# Patient Record
Sex: Female | Born: 1980
Health system: Southern US, Community
[De-identification: ages and names within clinical notes are randomized; demographics above are authoritative.]

## PROBLEM LIST (undated history)

## (undated) DIAGNOSIS — R112 Nausea with vomiting, unspecified: Secondary | ICD-10-CM

## (undated) DIAGNOSIS — G43019 Migraine without aura, intractable, without status migrainosus: Principal | ICD-10-CM

## (undated) DIAGNOSIS — Z9889 Other specified postprocedural states: Secondary | ICD-10-CM

## (undated) DIAGNOSIS — F410 Panic disorder [episodic paroxysmal anxiety] without agoraphobia: Secondary | ICD-10-CM

## (undated) DIAGNOSIS — M1990 Unspecified osteoarthritis, unspecified site: Secondary | ICD-10-CM

## (undated) DIAGNOSIS — E282 Polycystic ovarian syndrome: Secondary | ICD-10-CM

## (undated) DIAGNOSIS — IMO0002 Reserved for concepts with insufficient information to code with codable children: Secondary | ICD-10-CM

## (undated) HISTORY — PX: KNEE SURGERY: SHX244

## (undated) HISTORY — DX: Reserved for concepts with insufficient information to code with codable children: IMO0002

## (undated) HISTORY — DX: Polycystic ovarian syndrome: E28.2

## (undated) HISTORY — PX: KNEE ARTHROSCOPY W/ MENISCECTOMY: SHX1879

## (undated) HISTORY — DX: Panic disorder (episodic paroxysmal anxiety): F41.0

## (undated) HISTORY — DX: Migraine without aura, intractable, without status migrainosus: G43.019

## (undated) HISTORY — PX: TUBAL LIGATION: SHX77

---
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
## 2000-03-31 ENCOUNTER — Other Ambulatory Visit: Admission: RE | Admit: 2000-03-31 | Discharge: 2000-03-31 | Payer: Self-pay | Admitting: *Deleted

## 2000-09-29 ENCOUNTER — Other Ambulatory Visit: Admission: RE | Admit: 2000-09-29 | Discharge: 2000-09-29 | Payer: Self-pay | Admitting: *Deleted

## 2000-11-17 ENCOUNTER — Other Ambulatory Visit: Admission: RE | Admit: 2000-11-17 | Discharge: 2000-11-17 | Payer: Self-pay | Admitting: *Deleted

## 2001-06-07 ENCOUNTER — Other Ambulatory Visit: Admission: RE | Admit: 2001-06-07 | Discharge: 2001-06-07 | Payer: Self-pay | Admitting: Gynecology

## 2001-11-17 ENCOUNTER — Inpatient Hospital Stay (HOSPITAL_COMMUNITY): Admission: AD | Admit: 2001-11-17 | Discharge: 2001-11-17 | Payer: Self-pay | Admitting: Gynecology

## 2001-12-03 ENCOUNTER — Inpatient Hospital Stay (HOSPITAL_COMMUNITY): Admission: AD | Admit: 2001-12-03 | Discharge: 2001-12-06 | Payer: Self-pay | Admitting: Gynecology

## 2002-01-04 ENCOUNTER — Other Ambulatory Visit: Admission: RE | Admit: 2002-01-04 | Discharge: 2002-01-04 | Payer: Self-pay | Admitting: *Deleted

## 2002-09-24 ENCOUNTER — Other Ambulatory Visit: Admission: RE | Admit: 2002-09-24 | Discharge: 2002-09-24 | Payer: Self-pay | Admitting: Gynecology

## 2003-03-24 ENCOUNTER — Encounter (INDEPENDENT_AMBULATORY_CARE_PROVIDER_SITE_OTHER): Payer: Self-pay | Admitting: Specialist

## 2003-03-24 ENCOUNTER — Inpatient Hospital Stay (HOSPITAL_COMMUNITY): Admission: RE | Admit: 2003-03-24 | Discharge: 2003-03-26 | Payer: Self-pay | Admitting: Gynecology

## 2003-04-30 ENCOUNTER — Other Ambulatory Visit: Admission: RE | Admit: 2003-04-30 | Discharge: 2003-04-30 | Payer: Self-pay | Admitting: Gynecology

## 2004-05-17 ENCOUNTER — Other Ambulatory Visit: Admission: RE | Admit: 2004-05-17 | Discharge: 2004-05-17 | Payer: Self-pay | Admitting: Gynecology

## 2004-08-09 ENCOUNTER — Encounter: Admission: RE | Admit: 2004-08-09 | Discharge: 2004-08-09 | Payer: Self-pay | Admitting: Gastroenterology

## 2004-10-01 ENCOUNTER — Ambulatory Visit (HOSPITAL_COMMUNITY): Admission: RE | Admit: 2004-10-01 | Discharge: 2004-10-01 | Payer: Self-pay | Admitting: Gastroenterology

## 2005-05-25 ENCOUNTER — Other Ambulatory Visit: Admission: RE | Admit: 2005-05-25 | Discharge: 2005-05-25 | Payer: Self-pay | Admitting: Gynecology

## 2006-05-29 ENCOUNTER — Other Ambulatory Visit: Admission: RE | Admit: 2006-05-29 | Discharge: 2006-05-29 | Payer: Self-pay | Admitting: Gynecology

## 2007-06-08 ENCOUNTER — Other Ambulatory Visit: Admission: RE | Admit: 2007-06-08 | Discharge: 2007-06-08 | Payer: Self-pay | Admitting: Gynecology

## 2007-09-26 ENCOUNTER — Ambulatory Visit (HOSPITAL_COMMUNITY): Admission: RE | Admit: 2007-09-26 | Discharge: 2007-09-26 | Payer: Self-pay | Admitting: Surgery

## 2007-10-03 ENCOUNTER — Encounter: Admission: RE | Admit: 2007-10-03 | Discharge: 2007-10-03 | Payer: Self-pay | Admitting: Surgery

## 2007-10-12 ENCOUNTER — Ambulatory Visit (HOSPITAL_COMMUNITY): Admission: RE | Admit: 2007-10-12 | Discharge: 2007-10-12 | Payer: Self-pay | Admitting: Surgery

## 2007-10-17 ENCOUNTER — Ambulatory Visit (HOSPITAL_BASED_OUTPATIENT_CLINIC_OR_DEPARTMENT_OTHER): Admission: RE | Admit: 2007-10-17 | Discharge: 2007-10-17 | Payer: Self-pay | Admitting: Surgery

## 2007-10-20 ENCOUNTER — Ambulatory Visit: Payer: Self-pay | Admitting: Internal Medicine

## 2008-01-08 ENCOUNTER — Encounter: Admission: RE | Admit: 2008-01-08 | Discharge: 2008-02-04 | Payer: Self-pay | Admitting: Surgery

## 2008-01-22 ENCOUNTER — Inpatient Hospital Stay (HOSPITAL_COMMUNITY): Admission: RE | Admit: 2008-01-22 | Discharge: 2008-01-24 | Payer: Self-pay | Admitting: Surgery

## 2008-01-22 HISTORY — PX: ROUX-EN-Y GASTRIC BYPASS: SHX1104

## 2008-01-23 ENCOUNTER — Encounter (INDEPENDENT_AMBULATORY_CARE_PROVIDER_SITE_OTHER): Payer: Self-pay | Admitting: Surgery

## 2008-01-23 ENCOUNTER — Ambulatory Visit: Payer: Self-pay | Admitting: Vascular Surgery

## 2008-02-06 ENCOUNTER — Encounter: Admission: RE | Admit: 2008-02-06 | Discharge: 2008-05-06 | Payer: Self-pay | Admitting: Surgery

## 2008-07-30 ENCOUNTER — Encounter: Admission: RE | Admit: 2008-07-30 | Discharge: 2008-07-30 | Payer: Self-pay | Admitting: Surgery

## 2008-08-26 ENCOUNTER — Other Ambulatory Visit: Admission: RE | Admit: 2008-08-26 | Discharge: 2008-08-26 | Payer: Self-pay | Admitting: Gynecology

## 2008-08-26 ENCOUNTER — Encounter: Payer: Self-pay | Admitting: Gynecology

## 2008-08-26 ENCOUNTER — Ambulatory Visit: Payer: Self-pay | Admitting: Gynecology

## 2008-11-26 ENCOUNTER — Observation Stay (HOSPITAL_COMMUNITY): Admission: EM | Admit: 2008-11-26 | Discharge: 2008-11-26 | Payer: Self-pay | Admitting: Emergency Medicine

## 2008-12-18 ENCOUNTER — Inpatient Hospital Stay (HOSPITAL_COMMUNITY): Admission: EM | Admit: 2008-12-18 | Discharge: 2008-12-20 | Payer: Self-pay | Admitting: Emergency Medicine

## 2008-12-22 ENCOUNTER — Ambulatory Visit (HOSPITAL_COMMUNITY): Admission: RE | Admit: 2008-12-22 | Discharge: 2008-12-23 | Payer: Self-pay | Admitting: Surgery

## 2008-12-22 ENCOUNTER — Encounter (INDEPENDENT_AMBULATORY_CARE_PROVIDER_SITE_OTHER): Payer: Self-pay | Admitting: Surgery

## 2008-12-22 HISTORY — PX: LAPAROSCOPIC CHOLECYSTECTOMY: SUR755

## 2009-08-27 ENCOUNTER — Ambulatory Visit: Payer: Self-pay | Admitting: Gynecology

## 2009-08-27 ENCOUNTER — Other Ambulatory Visit: Admission: RE | Admit: 2009-08-27 | Discharge: 2009-08-27 | Payer: Self-pay | Admitting: Gynecology

## 2009-09-03 ENCOUNTER — Encounter: Admission: RE | Admit: 2009-09-03 | Discharge: 2009-09-03 | Payer: Self-pay | Admitting: Gynecology

## 2009-10-02 ENCOUNTER — Encounter: Admission: RE | Admit: 2009-10-02 | Discharge: 2009-10-02 | Payer: Self-pay | Admitting: Surgery

## 2010-07-09 LAB — DIFFERENTIAL
Basophils Absolute: 0 10*3/uL (ref 0.0–0.1)
Eosinophils Absolute: 0 10*3/uL (ref 0.0–0.7)
Eosinophils Relative: 0 % (ref 0–5)
Lymphocytes Relative: 25 % (ref 12–46)
Monocytes Absolute: 0.5 10*3/uL (ref 0.1–1.0)
Monocytes Relative: 8 % (ref 3–12)
Neutro Abs: 4.4 10*3/uL (ref 1.7–7.7)

## 2010-07-09 LAB — FERRITIN: Ferritin: 16 ng/mL (ref 10–291)

## 2010-07-09 LAB — CBC
HCT: 34 % — ABNORMAL LOW (ref 36.0–46.0)
Hemoglobin: 12 g/dL (ref 12.0–15.0)
MCHC: 34.2 g/dL (ref 30.0–36.0)
MCV: 93.7 fL (ref 78.0–100.0)
MCV: 95.5 fL (ref 78.0–100.0)
Platelets: 160 10*3/uL (ref 150–400)
RBC: 3.75 MIL/uL — ABNORMAL LOW (ref 3.87–5.11)
RDW: 12.5 % (ref 11.5–15.5)
RDW: 12.6 % (ref 11.5–15.5)

## 2010-07-09 LAB — BASIC METABOLIC PANEL
BUN: 7 mg/dL (ref 6–23)
Calcium: 8.3 mg/dL — ABNORMAL LOW (ref 8.4–10.5)
Chloride: 109 mEq/L (ref 96–112)
Creatinine, Ser: 0.7 mg/dL (ref 0.4–1.2)
GFR calc non Af Amer: >60 mL/min (ref >60)
Glucose, Bld: 77 mg/dL (ref 70–99)
Glucose, Bld: 88 mg/dL (ref 70–99)
Potassium: 3.8 mEq/L (ref 3.5–5.1)
Potassium: 3.9 mEq/L (ref 3.5–5.1)

## 2010-07-09 LAB — COMPREHENSIVE METABOLIC PANEL
Alkaline Phosphatase: 83 U/L (ref 39–117)
CO2: 23 mEq/L (ref 19–32)
Chloride: 111 mEq/L (ref 96–112)
GFR calc Af Amer: >60 mL/min (ref >60)
GFR calc non Af Amer: >60 mL/min (ref >60)
Glucose, Bld: 167 mg/dL — ABNORMAL HIGH (ref 70–99)
Potassium: 3.4 mEq/L — ABNORMAL LOW (ref 3.5–5.1)
Sodium: 140 mEq/L (ref 135–145)
Total Bilirubin: 0.8 mg/dL (ref 0.3–1.2)

## 2010-07-09 LAB — FOLATE: Folate: 15.9 ng/mL

## 2010-07-09 LAB — LIPID PANEL
Cholesterol: 95 mg/dL (ref 0–200)
LDL Cholesterol: 48 mg/dL (ref 0–99)
Total CHOL/HDL Ratio: 2.5 RATIO
Triglycerides: 46 mg/dL (ref <150)

## 2010-07-09 LAB — HEPATIC FUNCTION PANEL
Bilirubin, Direct: 0.1 mg/dL (ref 0.0–0.3)
Indirect Bilirubin: 0.5 mg/dL (ref 0.3–0.9)

## 2010-07-09 LAB — PROTIME-INR: INR: 1.2 (ref 0.00–1.49)

## 2010-07-09 LAB — RAPID URINE DRUG SCREEN, HOSP PERFORMED
Barbiturates: NOT DETECTED
Benzodiazepines: NOT DETECTED
Opiates: POSITIVE — AB
Tetrahydrocannabinol: NOT DETECTED

## 2010-07-09 LAB — URINALYSIS, ROUTINE W REFLEX MICROSCOPIC
Glucose, UA: NEGATIVE mg/dL
Protein, ur: NEGATIVE mg/dL
Urobilinogen, UA: 1 mg/dL (ref 0.0–1.0)
pH: 6.5 (ref 5.0–8.0)

## 2010-07-09 LAB — CARDIAC PANEL(CRET KIN+CKTOT+MB+TROPI)
CK, MB: 0.8 ng/mL (ref 0.3–4.0)
Relative Index: INVALID (ref 0.0–2.5)

## 2010-07-09 LAB — APTT: aPTT: 26 seconds (ref 24–37)

## 2010-07-09 LAB — IRON: Iron: 129 ug/dL (ref 42–135)

## 2010-07-10 LAB — DIFFERENTIAL
Eosinophils Absolute: 0 10*3/uL (ref 0.0–0.7)
Eosinophils Relative: 0 % (ref 0–5)
Lymphs Abs: 2 10*3/uL (ref 0.7–4.0)

## 2010-07-10 LAB — COMPREHENSIVE METABOLIC PANEL
ALT: 12 U/L (ref 0–35)
Albumin: 3.9 g/dL (ref 3.5–5.2)
Alkaline Phosphatase: 75 U/L (ref 39–117)
GFR calc Af Amer: >60 mL/min (ref >60)
Potassium: 4.2 mEq/L (ref 3.5–5.1)
Sodium: 138 mEq/L (ref 135–145)
Total Protein: 6.7 g/dL (ref 6.0–8.3)

## 2010-07-10 LAB — POCT PREGNANCY, URINE: Preg Test, Ur: NEGATIVE

## 2010-07-10 LAB — URINALYSIS, ROUTINE W REFLEX MICROSCOPIC
Glucose, UA: NEGATIVE mg/dL
Ketones, ur: 15 mg/dL — AB
Leukocytes, UA: NEGATIVE
Protein, ur: NEGATIVE mg/dL

## 2010-07-10 LAB — CBC
HCT: 36.8 % (ref 36.0–46.0)
MCV: 96.4 fL (ref 78.0–100.0)
Platelets: 219 10*3/uL (ref 150–400)
WBC: 11.6 10*3/uL — ABNORMAL HIGH (ref 4.0–10.5)

## 2010-07-10 LAB — URINE MICROSCOPIC-ADD ON

## 2010-08-17 NOTE — Discharge Summary (Signed)
Gabriella Sawyer, Gabriella Sawyer                 ACCOUNT NO.:  [id number]   MEDICAL RECORD NO.:  [id number]          PATIENT TYPE:  INP   LOCATION:  1523                         FACILITY:  [facility]   PHYSICIAN:  Thornton Park. [name], MD  DATE OF BIRTH:  1980/04/30   DATE OF ADMISSION:  01/22/2008  DATE OF DISCHARGE:  01/24/2008                               DISCHARGE SUMMARY   ADMITTING DIAGNOSIS:  Morbid obesity, body mass index of 50.   PROCEDURE:  Laparoscopic Roux-en-Y gastric bypass with a 1-M long Roux  limb, 40-cm BP limb, endoscopy per Dr. Johna Sheriff.   HOSPITAL COURSE:  Gabriella Sawyer is a 30 year old female who was taken  to the OR on October 20 and underwent the above-mentioned operation.  Postoperatively she did well and had a good Gastrografin swallow on  postoperative day 1.  Her diet was advanced.  Her Doppler study was  negative.  She proceeded to begin and was advanced on diet until postop  day 2 she was taking p.o.'s well.  She was given Roxicet for pain and  was discharged to follow-up in the office in the next week or so.  Condition good.      Thornton Park [name], MD  Electronically Signed     MBM/MEDQ  D:  02/07/2008  T:  02/07/2008  Job:  [id number]

## 2010-08-17 NOTE — Op Note (Signed)
Gabriella Sawyer, Gabriella Sawyer                 ACCOUNT NO.:  [id number]   MEDICAL RECORD NO.:  [id number]          PATIENT TYPE:  INP   LOCATION:  1523                         FACILITY:  [facility]   PHYSICIAN:  Sharlet Salina T. Hoxworth, M.D.DATE OF BIRTH:  1981/02/18   DATE OF PROCEDURE:  01/22/2008  DATE OF DISCHARGE:                               OPERATIVE REPORT   PROCEDURE:  Upper gastrointestinal endoscopy.   DESCRIPTION OF PROCEDURE:  Upper GI endoscopy was performed at the  completion of laparoscopic Roux-en-Y gastric bypass by Dr. [name].  The  Olympus video endoscope was inserted into the upper esophagus and then  passed under direct vision to the EG junction at approximately 40 cm.  The esophagus appeared normal.  The small gastric pouch was entered.  Then while Dr. [name] clamped the outlet of the pouch and under saline  irrigation, the pouch was tensely distended with air and there was no  evidence of leak.  The anastomosis was visualized and was patent.  Suture and staple lines were intact without bleeding.  The gastric  mucosa appeared normal.  The pouch measured about 5 cm in length and was  tubular in shape.  Following this, the pouch was desufflated and the  scope withdrawn.      Lorne Skeens. Hoxworth, M.D.  Electronically Signed     BTH/MEDQ  D:  01/22/2008  T:  01/23/2008  Job:  [id number]

## 2010-08-17 NOTE — Procedures (Signed)
NAME:  Gabriella Sawyer, Gabriella Sawyer                 ACCOUNT NO.:  [id number]   MEDICAL RECORD NO.:  [id number]          PATIENT TYPE:  OUT   LOCATION:  SLEEP CENTER                 FACILITY:  [facility]   PHYSICIAN:  Clinton D. Maple Hudson, MD, FCCP, FACPDATE OF BIRTH:  Jul 15[birth year] 1980   DATE OF STUDY:  10/17/2007                            NOCTURNAL POLYSOMNOGRAM   REFERRING PHYSICIAN:  Thornton Park. [name], MD   INDICATION FOR STUDY:  Hypersomnia with sleep apnea.   EPWORTH SLEEPINESS SCORE:  3/24, BMI 49.4, weight 270 pounds, height 62  inches, neck 16 inches.   MEDICATIONS:  Home medication:  None charted.   SLEEP ARCHITECTURE:  Total sleep time 389 minutes with sleep efficiency  92.7%.  Stage I was 4.5%, stage II 80.5%, stage III 0.1%, REM 14.9% of  total sleep time.  Sleep latency 24 minutes, REM latency 111 minutes,  awake after sleep onset 6 minutes, arousal index 7.4.  No bedtime  medication was taken.   RESPIRATORY DATA:  Apnea-hypopnea index (AHI) 0.2 per hour which is  normal.  A single hypopnea was recorded.   OXYGEN DATA:  Mild snoring with oxygen desaturation to a nadir of 91%.  Mean oxygen saturation through the study was 94.4% on room air.   CARDIAC DATA:  Normal sinus rhythm.   MOVEMENT-PARASOMNIA:  No significant movement disturbance.  No bathroom  trips.   IMPRESSIONS-RECOMMENDATIONS:  1. Normal study.  Apnea-hypopnea index 0.2 per hour (normal range 0 to      5 per hour).  Indicating no significant respiratory-related sleep      disturbance.  Mild snoring with normal oxygenation, desaturating to      a nadir of 91%.  2. No further measures are suggested by these study results.      Clinton D. Maple Hudson, MD, [facility], FACP  Diplomate, Biomedical engineer of Sleep Medicine  Electronically Signed     CDY/MEDQ  D:  10/20/2007 13:30:47  T:  10/20/2007 13:56:19  Job:  [id number]

## 2010-08-17 NOTE — Op Note (Signed)
Gabriella Sawyer, Gabriella Sawyer                 ACCOUNT NO.:  [id number]   MEDICAL RECORD NO.:  [id number]          PATIENT TYPE:  INP   LOCATION:  0003                         FACILITY:  [facility]   PHYSICIAN:  Thornton Park. [name], MD  DATE OF BIRTH:  1980-08-27   DATE OF PROCEDURE:  01/22/2008  DATE OF DISCHARGE:                               OPERATIVE REPORT   PREOPERATIVE DIAGNOSIS:  Morbid obesity, BMI 50.   PROCEDURE:  Laparoscopic Roux-en-Y gastric bypass with 1 meter Roux  limb, 40 cm bypass limb, ante colic, antegastric candy cane to the left  with closure of Peterson's defect, with upper endoscopy by Dr. Johna Sheriff.   SURGEON:  Luretha Murphy, M.D.   ASSISTANT:  Glenna Fellows, M.D.   ANESTHESIA:  General endotracheal.   OPERATIVE TIME:  3 hours.   DESCRIPTION OF PROCEDURE:  This a 30 year old white female taken to room  1 at [place] on the January 22, 2008.  After general anesthesia was  administered, the abdomen was prepped with Techni-Care and draped  sterilely.  The abdomen was entered using a 0 degrees OptiVu in the left  upper quadrant without difficulty.  The abdomen was insufflated.  Standard trocar placements were used including two 11s on the right, 11  to the left of the umbilicus for the camera and ultimately a 5 in the  upper abdomen for liver retractor.   The operation began by identification of ligament of Treitz after I took  down a plug of omentum that was seen to be incarcerated in her bilateral  tubal ligation port site.  There was no really hernia and I left this  plug in there.  The omentum was then taken up and put on the liver,  identified ligament of Treitz and then measured 40 cm distal to that  where I divided it with the Covidien 60 stapler white cartridge.  I then  put a Penrose drain, suturing it to the Roux limb end.  I then measured  100 cm and then laid the BP limb, adjacent that and created a side-to-  side stapled anastomosis again using a 60 white  cartridge.  I did this  by first putting down antimesenteric suture to hold the limbs parallel  and then opened along the antimesenteric borders.  The stapler was  inserted and fired.  The common defect was then closed from either end  with 2-0 Vicryl.  We tested that.  I did reinforce a portion of it and  then I put Tisseel on it ultimately.  Common defect between the  mesentery was closed with running 2-0 silk.  We then went up and divided  the omentum and then went up and put in [place] retractor.  I put in a  ruler and measured 5 cm down along the lesser curvature where we then  opened and then I inserted firing across and then up using the new Duet  (Covidien) staple line reinforcement on the 16 and then completely  divided the pouch.   The Roux limb was then brought up in an anticolic, antegastric fashion  and sewn  along the staple line with running 2-0 Vicryl.  Common openings  were made in either end.  This was done with the help of the Ewald tube  which also been used to size the pouch.  The 60 stapler blue was then  inserted and fired and the common defect creating the gastrojejunostomy  was then was performed.  The common opening was then closed from either  end with 2-0 Vicryl.  The second layer with the Ewald tube across the  anastomosis was then done using Laparoties and a 2-0 Vicryl on a free  needle.   We then clamped the efferent limb of the Roux and Dr. Johna Sheriff passed  the endoscope and we insufflated and tested our anastomosis under  pressure.  No bubbles were seen.  The pouch showed no evidence of  bleeding and looked good.  It measured about 5 cm in length.   Tisseel was applied to the gastrojejunostomy.  Everything looked to be  in order.  No bleeding was noted and I then removed the [facility],  deflated the abdomen, removed the trocars, injected them with half  percent Marcaine, closed the skin with 4-0 Vicryl subcutaneously with  staples and then tape.  The  patient was taken to recovery room in  satisfactory condition.      Thornton Park [name], MD  Electronically Signed     MBM/MEDQ  D:  01/22/2008  T:  01/22/2008  Job:  [id number]   cc:   Marcial Pacas P. Fontaine, M.D.  Fax: [phone]   Jene Every, M.D.  Fax: [phone]   Darin Engels, PA [facility]  [facility]

## 2010-08-20 NOTE — H&P (Signed)
NAME:  MINETTA, KRISHER                           ACCOUNT NO.:  [id number]   MEDICAL RECORD NO.:  [id number]                   PATIENT TYPE:  INP   LOCATION:  NA                                   FACILITY[facility]  [facility]   PHYSICIAN:  Timothy P. Fontaine, M.D.           DATE OF BIRTH:  1980-07-08   DATE OF ADMISSION:  03/24/2003  DATE OF DISCHARGE:                                HISTORY & PHYSICAL   She is being admitted, March 24, 2003 at 9 a.m., for a repeat cesarean  section and BTL.   CHIEF COMPLAINT:  Pregnancy at term, history of prior cesarean section,  desires repeat cesarean section, desires permanent sterilization.   HISTORY OF PRESENT ILLNESS:  A 30 year old G56, P51 female at term gestation  with a history of prior cesarean section for failed labor who desires repeat  cesarean section.  The patient was counseled for trial of labor, and she has  elected for a repeat C-section.  She has also been counseled as far as tubal  sterilization for which she wants to proceed.  She was counseled as to the  permanency of the procedure as well as the risk of failure and the issues as  far as her young age, and she understands and accepts all of these issues  and wants to proceed with sterilization.  For the remainder of her history,  see her [facility].   PHYSICAL EXAMINATION:  HEENT:  Normal.  LUNGS:  Clear.  CARDIAC:  Regular rate without rubs, murmurs or gallops.  ABDOMEN:  Benign with a gravid, vertex fetus.  Positive fetal heart tones.  PELVIC:  Deferred.   ASSESSMENT:  A 30 year old gravida 2 para 1 G2, P35 female, term gestation,  prior cesarean section for repeat cesarean section and bilateral tubal  ligation.   The patient was counseled as to the risks, benefits, indications and  alternatives to include trial of labor as well as other forms of birth  control.  The risks of bleeding, transfusion, infection, prolonged  antibiotics, abscess formation requiring re-operation and drainage,  wound  complications requiring opening and draining of incisions, closure by  secondary intention were all discussed, understood and accepted.  The risk  of an impertinent injury to internal organs including bowel, bladder,  ureters, vessels and nerves necessitating major reparative surgeries and  future reparative surgeries including ostomy formation were discussed,  understood and accepted.  The risk of fetal injury during the birthing  process was also reviewed.  The patient's questions were answered to her  satisfaction.  She is ready to proceed with surgery.                                               Timothy P. Fontaine, M.D.    TPF/MEDQ  D:  03/21/2003  T:  03/21/2003  Job:  [id number]

## 2010-08-20 NOTE — Op Note (Signed)
NAME:  Gabriella Sawyer, Gabriella Sawyer                           ACCOUNT NO.:  [id number]   MEDICAL RECORD NO.:  [id number]                   PATIENT TYPE:  INP   LOCATION:  9101                                 FACILITY:  [facility]   PHYSICIAN:  Timothy P. Fontaine, M.D.           DATE OF BIRTH:  1980-05-05   DATE OF PROCEDURE:  03/24/2003  DATE OF DISCHARGE:                                 OPERATIVE REPORT   PREOPERATIVE DIAGNOSES:  1. Pregnancy at term.  2. Prior cesarean section, desires repeat cesarean section.  3. Desires permanent sterilization.   POSTOPERATIVE DIAGNOSES:  1. Pregnancy at term.  2. Prior cesarean section, desires repeat cesarean section.  3. Desires permanent sterilization.   PROCEDURE:  Repeat low transverse cervical cesarean section.  Bilateral  tubal sterilization.  Modified Pomeroy technique.   SURGEON:  Timothy P. Fontaine, M.D.   ASSISTANTGaetano Hawthorne. Lily Peer, M.D.   ANESTHESIA:  Spinal.   ESTIMATED BLOOD LOSS:  Less than 500 mL.   COMPLICATIONS:  None.   SPECIMENS:  Portions of right and portions of left fallopian tubes.  Cord  blood.   FINDINGS:  At 9:08, normal female, Apgars 9 and 9.  Pelvic anatomy noted to be  normal.   DESCRIPTION OF PROCEDURE:  The patient was taken to the operating room and  underwent spinal anesthesia, placed in the left tilt supine position.  Received an abdominal preparation with Betadine solution.  Bladder emptied  with indwelling Foley catheterization.  The patient was draped in usual  fashion.  After assuring adequate anesthesia, the abdomen was sharply  entered through a repeat Pfannenstiel incision achieving adequate hemostasis  at all levels.  The bladder flap was sharply and bluntly developed without  difficulty.  Uterus was sharply entered in the lower uterine segment and  bluntly extended laterally.  The bulging membranes were ruptured.  The fluid  noted to be clear.  The infant's head was delivered through the incision.  Nares and mouth suctioned.  A nuchal cord was reduced. The rest of the  infant was delivered. The cord doubly clamped and cut.  The infant was  handed to pediatrics in attendance.  Samples of cord blood were obtained.  The placenta was spontaneously extruded and noted to be intact.  Uterus was  exteriorized.  Endometrial cavity explored with a sponge to remove all  placental membrane fragments.  The patient received 1 g Ancef antibiotic  prophylaxis at this time.  The uterine incision was closed in one layer  using 0 Vicryl suture in a running interlocking stitch.  Several figure-of-  eight interrupted sutures were placed to reinforce the incision line for  ultimate hemostasis.  Attention was then turned to the tubal sterilization.  The right fallopian tube was then identified, traced from its insertion to  its fimbriated end and a mid tubal segment was then grasped, elevated,  doubly ligated using 0 plain suture and a  representative intervening segment  excised.  Tubal lumen as well as adequate hemostasis was grossly visualized.  A similar procedure was carried out on the other side.  The uterus was then  returned to the abdomen which was copiously irrigated.  Adequate hemostasis  visualized and the anterior fascia was reapproximated using 0 Vicryl suture  in a running stitch.  Subcutaneous tissues irrigated.  Adequate hemostasis  achieved with electrocautery.  Skin reapproximated with 4-0 Vicryl in a  running subcuticular stitch.  Steri-Strips and Benzoin applied.  Sterile  dressing applied.  The patient was taken to the recovery room in good  condition having tolerated the procedure well.                                               Timothy P. Audie Box, M.D.    TPF/MEDQ  D:  03/24/2003  T:  03/24/2003  Job:  [id number]

## 2010-08-20 NOTE — Op Note (Signed)
NAME:  Gabriella Sawyer, Gabriella Sawyer                           ACCOUNT NO.:  [id number]   MEDICAL RECORD NO.:  [id number]                   PATIENT TYPE:  INP   LOCATION:  9119                                 FACILITY:  [facility]   PHYSICIAN:  Devin M. Ciliberti, M.D.            DATE OF BIRTH:  1980/10/11   DATE OF PROCEDURE:  12/03/2001  DATE OF DISCHARGE:                                 OPERATIVE REPORT   PREOPERATIVE DIAGNOSIS:  Arrest of dilatation.   POSTOPERATIVE DIAGNOSIS:  Arrest of dilatation.   PROCEDURE:  Primary low transverse cesarean section.   SURGEON:  Devin M. Ciliberti, M.D.   ANESTHESIA:  Epidural with combined spinal.   ESTIMATED BLOOD LOSS:  600 cc.   URINE OUTPUT:  200 cc of clear fluid.   FLUIDS OUTPUT:  2 L Crystalloid.   COMPLICATIONS:  None.   SPECIMENS:  Cord blood.   FINDINGS:  Living 9 pound 5 ounce female, Apgars 8 and 9 with three-vessel  cord and placenta intact.  Normal-appearing uterus, tubes and ovaries.   DESCRIPTION OF PROCEDURE:  The patient was taken to the operating room where  epidural anesthesia was bolused; however, at this point the patient is still  feeling some tenderness on the right.  At that point, anesthesia placed a  spinal block above the epidural, which the patient was then adequately  anesthetized.  The patient was then prepped and draped in normal sterile  fashion.  A Pfannenstiel incision was made with a scalpel and carried down  to the underlying fascia.  The fascia was then extended transversely using  curved Mayo scissors.  The underlying rectus muscles were dissected off both  bluntly and sharply with curved Mayo scissors.  The rectus muscles were  separated in the midline manually and extended both superiorly and then  inferiorly to the level of the pubic bone.  The peritoneum was then entered  sharply using the hemostat.  The peritoneal incision was then extended  superiorly and then inferiorly to the level of the bladder.  The  vesicouterine peritoneum was identified and incised, extended transversely.  The bladder flap was then created digitally.  The bladder blade was then  placed in the newly created bladder flap.  A low transverse uterine incision  was made with the scalpel.  The incision was extended manually.  The  infant's head was then delivered atraumatically.  The mouth and nose  suctioned; the rest of the body was delivered without any complications.  The cord was clamped and cut, and the infant handed off to the awaiting NIC  attendance.  Cord blood was obtained.  The placenta was massaged from the  uterus.  The uterus was then exteriorized and cleared of all clots and  debris.  A running 1-0 chromic interlocking stitch was then used to close  the lower uterine segment.  A pack was then placed in the incision line.  The posterior cul-de-sac was then irrigated with warm normal saline.  The  pack was then removed and several areas on the incision were noted to have  some bleeding; these were Bovie cauterized.  The uterus was then returned to  the pelvis.  One liter of saline was then used to irrigate the pelvis, and  the incision noted to be hemostatic at this point.  The peritoneum was then  closed using a 0 chromic in a running continuous stitch.  The subfascial  area was inspected for bleeding and noted to be hemostatic.  The fascia was  then closed using 0 Vicryl in a running continuous stitch.  The subcutaneous  tissue was then irrigated with warm normal saline, and Bovie cautery of the  areas that were bleeding.  A 3-0 plain was then used to close the  subcutaneous tissue and staples were used to close the skin.  The patient  was taken to the recovery room in stable condition.                                               Devin M. Ciliberti, M.D.    DMC/MEDQ  D:  12/03/2001  T:  12/03/2001  Job:  [id number]

## 2010-08-20 NOTE — Discharge Summary (Signed)
NAME:  Gabriella Sawyer, Gabriella Sawyer                           ACCOUNT NO.:  [id number]   MEDICAL RECORD NO.:  [id number]                   PATIENT TYPE:  INP   LOCATION:  9101                                 FACILITY:  [facility]   PHYSICIAN:  Timothy P. Fontaine, M.D.           DATE OF BIRTH:  [birth year]   DATE OF ADMISSION:  03/24/2003  DATE OF DISCHARGE:  03/26/2003                                 DISCHARGE SUMMARY   DISCHARGE DIAGNOSES:  1. Intrauterine pregnancy at 39 weeks delivered.  2. History of prior cesarean section.  3. Desired repeat cesarean section.  4. Desired attempt at permanent sterilization.  5. Status post repeat low transverse cervical cesarean section and bilateral     tubal sterilization modified Pomeroy technique by Dr. Colin Broach on     March 24, 2003.   HISTORY:  This 22-years-of-age female gravida 2 para 1 with a prenatal  course that was complicated by history of prior cesarean section, desired  repeat cesarean section; also desired attempt at permanent sterilization.   HOSPITAL COURSE:  On March 24, 2003 the patient was admitted, underwent a  repeat low transverse cervical cesarean section and bilateral tubal  sterilization modified Pomeroy technique by Dr. Colin Broach and  underwent delivery of a female, Apgars of 9 and 9, weight of 7 pounds 5  ounces.  There were no complications.  Postoperatively, the patient remained  afebrile, voiding, in stable condition, and she was discharged to home on  March 26, 2003 in satisfactory condition and given [facility] Gy[facility]  postpartum instructions and postpartum booklet.   ACCESSORY CLINICAL FINDINGS/LABORATORY DATA:  The patient is AB positive,  rubella immune.  On March 25, 2003 hemoglobin was 11.3.   DISPOSITION:  1. The patient was is discharged to home.  2. Follow-up in 6 weeks; if any problem prior to that time to be seen in the     office.  3. Was given prescription for Tylox p.r.n. pain.     Susa Loffler, P.A.                    Timothy P. Audie Box, M.D.    Ardath Sax  D:  04/28/2003  T:  04/28/2003  Job:  [id number]

## 2010-08-20 NOTE — Discharge Summary (Signed)
   NAME:  Gabriella Sawyer, Gabriella Sawyer                           ACCOUNT NO.:  [id number]   MEDICAL RECORD NO.:  [id number]                   PATIENT TYPE:  INP   LOCATION:  9119                                 FACILITY:  [facility]   PHYSICIAN:  Timothy P. Fontaine, M.D.           DATE OF BIRTH:  November 10, 1980   DATE OF ADMISSION:  12/03/2001  DATE OF DISCHARGE:  12/06/2001                                 DISCHARGE SUMMARY   DISCHARGE DIAGNOSES:  1. Intrauterine pregnancy at 40 weeks, delivered.  2. Arrest of dilatation, status post primary low transverse Cesarean section     on December 03, 2001.   HISTORY OF PRESENT ILLNESS:  This is a 30 year old female, G1, P0 with an  EDC of November 28, 2001, whose prenatal course was uncomplicated.   HOSPITAL COURSE:  On December 03, 2001, the patient presented at 40 weeks in  labor.  Artificial rupture of membranes were performed.  Labor was augmented  with Pitocin.  The patient developed a diagnosis of arrest of dilatation.  Therefore, the patient underwent a primary low transverse cesarean section  with Apgar's of 8 and 9 weighing 9 pounds 5 ounces with no complications.   On postop day #1, the patient did complain that one pupil size was greater  than the left which resolved spontaneously thought to be secondary to  scopolamine patch that had behind that ear.  She was afebrile, voiding and  in stable condition.  She was discharged to home on December 06, 2001, and  given [facility] follow-up appointment.   The patient is A positive and rubella immune.  On December 04, 2001,  hemoglobin was 11.6.   DISPOSITION:  The patient is discharged to home to return in six weeks.   SPECIAL INSTRUCTIONS:  If she has any problems before follow-up appointment,  she can be seen in the office.   DISCHARGE MEDICATIONS:  Tylox p.r.n. pain.     Susa Loffler, P.A.                    Timothy P. Audie Box, M.D.    Ardath Sax  D:  01/04/2002  T:  01/05/2002  Job:   [id number]

## 2010-08-20 NOTE — Op Note (Signed)
NAMEHALLEL, Gabriella Sawyer                 ACCOUNT NO.:  [id number]   MEDICAL RECORD NO.:  [id number]          PATIENT TYPE:  AMB   LOCATION:  ENDO                         FACILITY:  [facility]   PHYSICIAN:  Anselmo Rod, M.D.  DATE OF BIRTH:  1980/06/22   DATE OF PROCEDURE:  10/01/2004  DATE OF DISCHARGE:                                 OPERATIVE REPORT   PROCEDURE PERFORMED:  Screening colonoscopy.   ENDOSCOPIST:  Anselmo Rod, M.D.   INSTRUMENT USED:  Olympus video colonoscope.   INDICATIONS FOR PROCEDURE:  A 30 year old white female with a family history  of colon cancer in a maternal grandmother, undergoing colonoscopy for severe  abdominal pain to rule out colonic polyps, masses, etc., ? IBD.   PREPROCEDURE PREPARATION:  Informed consent was procured from the patient.  The patient was fasted for 8 hours prior to the procedure and prepped with a  bottle of magnesium citrate and a gallon of GoLYTELY the night prior to the  procedure. Risks and benefits of the procedure including a 10% miss rate of  cancer and polyps was discussed with the patient as well.   PREPROCEDURE PHYSICAL:  VITAL SIGNS:  The patient has stable vital signs.  NECK:  Supple.  CHEST:  Clear to auscultation. S1 and S2 regular.  ABDOMEN:  Soft with normal bowel sounds.   DESCRIPTION OF PROCEDURE:  The patient was placed in a left lateral  decubitus position and sedated with 75 mg of Demerol and 7.5 mg of Versed in  slow incremental doses. Once the patient was adequately sedated and  maintained on low flow oxygen and continuous cardiac monitoring, the Olympus  video colonoscope was advanced from the rectum to the cecum. The appendiceal  orifice and ileocecal valve were clearly visualized and photographed. No  masses, polyps, erosions, ulcerations, or diverticula was seen. Small  internal hemorrhoids were appreciated on retroflexion in the rectum. The  patient had abdominal discomfort with insufflation of air into  the colon  indicating a component of visceral hypersensitivity. The patient tolerated  the procedure well without complications.   IMPRESSION:  1.  Normal colonoscopy of the terminal ileum. No masses or polyps seen.  2.  Small internal hemorrhoids.  3.  Visceral hypersensitivity indicated by abdominal discomfort with      insufflation of air into the colon and rectum.   RECOMMENDATIONS:  1.  Continue a high-fiber diet with liberal fluid intake.  2.  A trial of Zelnorm versus Amitiza will be considered.  3.  Repeat colonoscopy at the age of 18.  4.  Outpatient followup in the next four weeks for further recommendations.       JNM/MEDQ  D:  10/01/2004  T:  10/01/2004  Job:  [id number]   cc:   Nadyne Coombes. Fontaine, M.D.  Fax: [phone]

## 2010-08-31 ENCOUNTER — Other Ambulatory Visit: Payer: Self-pay | Admitting: Gynecology

## 2010-08-31 ENCOUNTER — Other Ambulatory Visit (HOSPITAL_COMMUNITY)
Admission: RE | Admit: 2010-08-31 | Discharge: 2010-08-31 | Disposition: A | Discharge status: Home / Self Care | Payer: 59 | Source: Ambulatory Visit | Attending: Gynecology | Admitting: Gynecology

## 2010-08-31 ENCOUNTER — Encounter (INDEPENDENT_AMBULATORY_CARE_PROVIDER_SITE_OTHER): Payer: 59 | Admitting: Gynecology

## 2010-08-31 DIAGNOSIS — Z833 Family history of diabetes mellitus: Secondary | ICD-10-CM

## 2010-08-31 DIAGNOSIS — Z13220 Encounter for screening for lipoid disorders: Secondary | ICD-10-CM

## 2010-08-31 DIAGNOSIS — Z01419 Encounter for gynecological examination (general) (routine) without abnormal findings: Secondary | ICD-10-CM

## 2010-08-31 DIAGNOSIS — Z124 Encounter for screening for malignant neoplasm of cervix: Secondary | ICD-10-CM | POA: Insufficient documentation

## 2010-10-07 ENCOUNTER — Encounter (INDEPENDENT_AMBULATORY_CARE_PROVIDER_SITE_OTHER): Payer: Self-pay | Admitting: Surgery

## 2010-10-07 ENCOUNTER — Encounter (INDEPENDENT_AMBULATORY_CARE_PROVIDER_SITE_OTHER): Payer: Self-pay | Admitting: General Surgery

## 2010-10-08 ENCOUNTER — Encounter (INDEPENDENT_AMBULATORY_CARE_PROVIDER_SITE_OTHER): Payer: Self-pay | Admitting: Surgery

## 2010-10-08 ENCOUNTER — Ambulatory Visit (INDEPENDENT_AMBULATORY_CARE_PROVIDER_SITE_OTHER): Payer: 59 | Admitting: Surgery

## 2010-10-08 VITALS — BP 130/70 | HR 72 | Ht 62.0 in | Wt 183.0 lb

## 2010-10-08 DIAGNOSIS — M793 Panniculitis, unspecified: Secondary | ICD-10-CM

## 2010-10-08 NOTE — Progress Notes (Signed)
Allyse Fregeau returns today. She is 2.75 years out from her laparoscopic gastric bypass. Percent excess weight loss is 56% and today his BMI is down to 33.  Her main problem now is panniculitis irritation beneath her redundant pannus. She is seen for left him regarding this irritation and he has documented this and treated her with topical agents. She's had associated skin erythema and a painful rash.  She like to proceed with panniculectomy. I described this procedure to her in some detail and she is aware that we may have to sacrifice her umbilicus and resecting this and generous pannus.  Impression panniculitis related to bariatric surgery weight loss. Recommendation panniculectomy

## 2011-01-04 LAB — DIFFERENTIAL
Basophils Absolute: 0
Basophils Absolute: 0.1
Basophils Relative: 0
Eosinophils Absolute: 0
Eosinophils Absolute: 0
Eosinophils Absolute: 0.3
Eosinophils Relative: 0
Eosinophils Relative: 0
Eosinophils Relative: 5
Lymphs Abs: 1.5
Lymphs Abs: 4.4 — ABNORMAL HIGH
Monocytes Absolute: 0.4
Monocytes Absolute: 0.6
Neutrophils Relative %: 86 — ABNORMAL HIGH

## 2011-01-04 LAB — CBC
HCT: 37.5
HCT: 37.8
MCHC: 33.3
MCHC: 34.6
MCV: 91.9
MCV: 92.5
Platelets: 223
Platelets: 291
RBC: 4.52
RDW: 13.2
WBC: 11.8 — ABNORMAL HIGH
WBC: 7.1

## 2011-01-04 LAB — COMPREHENSIVE METABOLIC PANEL
ALT: 18
AST: 18
CO2: 26
Chloride: 106
GFR calc Af Amer: >60
GFR calc non Af Amer: >60
Potassium: 3.7
Sodium: 139
Total Bilirubin: 0.7

## 2011-01-04 LAB — PREGNANCY, URINE: Preg Test, Ur: NEGATIVE

## 2011-01-04 LAB — PROTIME-INR: Prothrombin Time: 13.5

## 2011-01-24 ENCOUNTER — Ambulatory Visit (HOSPITAL_BASED_OUTPATIENT_CLINIC_OR_DEPARTMENT_OTHER): Admission: RE | Admit: 2011-01-24 | Payer: 59 | Source: Ambulatory Visit | Admitting: Specialist

## 2011-01-31 ENCOUNTER — Encounter: Payer: Self-pay | Admitting: *Deleted

## 2011-01-31 ENCOUNTER — Telehealth: Payer: Self-pay | Admitting: *Deleted

## 2011-01-31 DIAGNOSIS — E282 Polycystic ovarian syndrome: Secondary | ICD-10-CM | POA: Insufficient documentation

## 2011-01-31 NOTE — Telephone Encounter (Signed)
Patient's Mirena IUD is past time to change.  Now c/o pain with passing clots.  Wants to know if we can go ahead and change out IUD now? Or should she be concerned something else going on? Please advise

## 2011-01-31 NOTE — Telephone Encounter (Signed)
Patient informed.  Appointments to set up.

## 2011-01-31 NOTE — Telephone Encounter (Signed)
Recommend office visit to address the pain and clots first period then can arrange IUD replacement.

## 2011-02-01 ENCOUNTER — Encounter: Payer: Self-pay | Admitting: Gynecology

## 2011-02-01 ENCOUNTER — Ambulatory Visit (INDEPENDENT_AMBULATORY_CARE_PROVIDER_SITE_OTHER): Payer: 59 | Admitting: Gynecology

## 2011-02-01 DIAGNOSIS — N949 Unspecified condition associated with female genital organs and menstrual cycle: Secondary | ICD-10-CM

## 2011-02-01 DIAGNOSIS — N926 Irregular menstruation, unspecified: Secondary | ICD-10-CM

## 2011-02-01 DIAGNOSIS — R102 Pelvic and perineal pain unspecified side: Secondary | ICD-10-CM

## 2011-02-01 DIAGNOSIS — B3731 Acute candidiasis of vulva and vagina: Secondary | ICD-10-CM

## 2011-02-01 DIAGNOSIS — N898 Other specified noninflammatory disorders of vagina: Secondary | ICD-10-CM

## 2011-02-01 DIAGNOSIS — B373 Candidiasis of vulva and vagina: Secondary | ICD-10-CM

## 2011-02-01 DIAGNOSIS — Z30431 Encounter for routine checking of intrauterine contraceptive device: Secondary | ICD-10-CM

## 2011-02-01 LAB — POCT URINE PREGNANCY: Preg Test, Ur: NEGATIVE

## 2011-02-01 MED ORDER — FLUCONAZOLE 150 MG PO TABS: 150.0000 mg | ORAL_TABLET | Freq: Once | ORAL | Status: AC

## 2011-02-01 NOTE — Progress Notes (Signed)
Patient presents complaining of vaginal bleeding with clots last several days with some vaginal pain some irritation and some end stream dysuria. She has a Mirena IUD due to be replaced July 2012. She does have a history of tubal sterilization and was using the Mirena for menstrual suppression and we just let her go past her due date is noted with her last office note. No fevers chills nausea vomiting diarrhea constipation.  Exam Abdomen soft nontender without masses guarding rebound organomegaly Pelvic external BUS vagina with no bleeding white discharge noted KOH wet prep done cervix normal bimanual uterus normal size anteverted midline mobile nontender adnexa without masses or tenderness Subsequently her Mirena IUD was removed the string was grasped with opposing forceps and easily removed. It was shown to the patient and discarded  Assessment and plan: 1. Vaginal discharge and stream dysuria. Wet prep is positive for yeast UA is negative we'll treat with Diflucan 150x1 dose follow up if symptoms persist or recur 2. IUD management. I think this bleeding is her first menses which is now resolved. Her IUD was removed I did a UPT today for completeness and a was negative. Patient can follow up and schedule a placement appointment for her.   If her irregular bleeding persists her pain persists then we'll reevaluate.

## 2011-02-02 ENCOUNTER — Telehealth: Payer: Self-pay

## 2011-02-02 NOTE — Telephone Encounter (Signed)
Patient called Gabriella Sawyer this morning. Amy had checked ins benefits with [facility] for IUD/insertion and called her yesterday and told her $150 copymt and 60/40 coinsurance applied. Patient said she herself called and talked with [facility] and was told $15 copymt.  Gabriella Sawyer asked me to call and check.  I spoke with F[place]on Mole who could not quote me benefit for [id number] however, she thought that the 58300 insertion would be a $15 copymt. She said the J code was not specifically addressed and she recommended doing a predetermination of benefits to be sure [employer] would pay for it.  I called the patient and relayed this to her.  Patient said she would have remembered it costing her a lot of money if it had before and she is confident with the info she received from [facility].  I told her I had printed out benefits and I tended to agree with her after reading through it but I could not guarantee it.  I told her I would be happy to do the predetermination form for her and it would tak 21 calendar days.  She declined and said she would keep appt for tomorrow. I told her we would accept $15 copymt but she would need to sign waiver saying she understood IUD might not be covered and she would be responsible for that payment if not. She agreed.

## 2011-02-03 ENCOUNTER — Telehealth: Payer: Self-pay | Admitting: *Deleted

## 2011-02-03 MED ORDER — MEGESTROL ACETATE 20 MG PO TABS: 20.0000 mg | ORAL_TABLET | Freq: Every day | ORAL | Status: AC

## 2011-02-03 NOTE — Telephone Encounter (Signed)
Pt had Mirena taken out. Needs a new one put in. Pt cancelled apt for apt for tomorrow due conflicting benefits. Three different benefits given so therefore a predetermination of benefits is going to be sent to [employer]. Pt wants to know if there is anything she can do this month until we get this information back. Since you took out the IUD she has been bleeding, more than spotting. She does have her tubes tied so its not for [facility] reasons. She wants to know if theres something you can do in the meantime.

## 2011-02-03 NOTE — Telephone Encounter (Signed)
Trial of Megace 20 mg daily for 2 weeks

## 2011-02-04 ENCOUNTER — Ambulatory Visit: Payer: 59 | Admitting: Gynecology

## 2011-02-07 NOTE — Telephone Encounter (Signed)
No answer

## 2011-02-08 ENCOUNTER — Ambulatory Visit: Payer: 59 | Admitting: Gynecology

## 2011-02-10 NOTE — Telephone Encounter (Signed)
Left message for patient to call back  

## 2011-02-10 NOTE — Telephone Encounter (Signed)
Informed, pt states quick bleedng so therefore wont get RX. Will let us know if starts bleeding again.

## 2011-02-18 NOTE — Progress Notes (Signed)
On November 8th,2012 I mailed patient a letter. The insurance company called in my voice mail in response to my request for Predetermination of Benefits to say that the two IUD codes "are covered benefits". They did not offer any information regarding patient's responsibility whether copaymt or deductible. The lady that called did not leave a phone number.  I included this info in my letter to the patient. ka

## 2011-03-02 ENCOUNTER — Telehealth: Payer: Self-pay | Admitting: *Deleted

## 2011-03-02 ENCOUNTER — Other Ambulatory Visit: Payer: Self-pay | Admitting: *Deleted

## 2011-03-02 ENCOUNTER — Ambulatory Visit (INDEPENDENT_AMBULATORY_CARE_PROVIDER_SITE_OTHER): Payer: 59 | Admitting: Gynecology

## 2011-03-02 ENCOUNTER — Encounter: Payer: Self-pay | Admitting: Gynecology

## 2011-03-02 DIAGNOSIS — Z30431 Encounter for routine checking of intrauterine contraceptive device: Secondary | ICD-10-CM

## 2011-03-02 DIAGNOSIS — Z30430 Encounter for insertion of intrauterine contraceptive device: Secondary | ICD-10-CM

## 2011-03-02 DIAGNOSIS — Z3049 Encounter for surveillance of other contraceptives: Secondary | ICD-10-CM

## 2011-03-02 MED ORDER — LEVONORGESTREL 20 MCG/24HR IU IUD: INTRAUTERINE_SYSTEM | Freq: Once | INTRAUTERINE | Status: DC

## 2011-03-02 MED ORDER — LEVONORGESTREL 20 MCG/24HR IU IUD: 1.0000 | INTRAUTERINE_SYSTEM | Freq: Once | INTRAUTERINE | Status: DC

## 2011-03-02 NOTE — Progress Notes (Signed)
Patient presents for Mirena IUD placement. History of menorrhagia controlled with the Mirena IUD. She just had her IUD removed last month and now is on a normal heavy period. She is status post BTL in the past. I again reviewed with her the issues of Mirena IUD and was involved with the placement. I discussed the risks to include uterine perforation requiring surgery to remove infection as well as long-term issues of infection and failure. She was reviewed the booklet signed the consent form and has no questions.  Exam External BUS vagina with menstrual type flow. Cervix normal. Uterus anteverted normal size midline mobile nontender. Adnexa without masses or tenderness.  IUD placement: Cervix was cleansed with Hibiclens grasped with a single-tooth tenaculum and the uterus was sounded. A Mirena IUD was placed according to manufacturer's recommendation. The strings were trimmed. The patient tolerated well and will represent in one month for postinsertional check.  Lot number [id number]

## 2011-03-02 NOTE — Patient Instructions (Signed)
Follow up in one month for a postinsertional IUD check

## 2011-03-02 NOTE — Progress Notes (Signed)
Addended byValeda Malm L on: 03/02/2011 11:20 AM   Modules accepted: Orders

## 2011-03-02 NOTE — Telephone Encounter (Signed)
Patient had called for a status on coverage for Mirena IUD.  Told patient that by the information we got from the insurance company that she would have a $15 copay, but would have to sign a waver at the check-in and she agreed to do that.  Patient began bleeding yesterday so we will set up insert with TF.

## 2011-03-14 ENCOUNTER — Telehealth: Payer: Self-pay | Admitting: *Deleted

## 2011-03-14 MED ORDER — MEGESTROL ACETATE 20 MG PO TABS: 20.0000 mg | ORAL_TABLET | Freq: Every day | ORAL | Status: AC

## 2011-03-14 NOTE — Telephone Encounter (Signed)
Pt informed with the below note. 

## 2011-03-14 NOTE — Telephone Encounter (Signed)
Pt called c/o bleeding post IUD placement on 03/02/11. Pt is aware that bleeding is normal with placement, but states bleeding is bright red and brown at times, heavy then light. She called on 02/23/11 and RX(megace 20 mg daily for 2 weeks) was sent to pharmacy, but pt stop bleeding so she never got rx. Pt called today to pick rx and pharmacy never got it. Pt would like rx for megace to stop bleeding. Please advise.

## 2011-03-14 NOTE — Telephone Encounter (Signed)
Megace order was placed 20 mg daily x14

## 2011-04-01 ENCOUNTER — Encounter: Payer: Self-pay | Admitting: Gynecology

## 2011-04-01 ENCOUNTER — Ambulatory Visit (INDEPENDENT_AMBULATORY_CARE_PROVIDER_SITE_OTHER): Payer: 59 | Admitting: Gynecology

## 2011-04-01 VITALS — BP 112/68

## 2011-04-01 DIAGNOSIS — Z975 Presence of (intrauterine) contraceptive device: Secondary | ICD-10-CM

## 2011-04-01 DIAGNOSIS — N921 Excessive and frequent menstruation with irregular cycle: Secondary | ICD-10-CM

## 2011-04-01 MED ORDER — FLUCONAZOLE 150 MG PO TABS: 150.0000 mg | ORAL_TABLET | Freq: Once | ORAL | Status: AC

## 2011-04-01 MED ORDER — DOXYCYCLINE HYCLATE 100 MG PO CAPS: 100.0000 mg | ORAL_CAPSULE | Freq: Two times a day (BID) | ORAL | Status: AC

## 2011-04-01 NOTE — Progress Notes (Signed)
Patient presents one month status post IUD placement. She continues to spot on and off since placement. Was treated with a course of Megace but continues to bleed despite this.  She's also having some bilateral lower pelvic cramping. No fevers chills nausea vomiting diarrhea constipation.  Exam with Elane Fritz chaperone present Pelvic: External BUS vagina normal. Cervix normal with IUD string appropriate length. Uterus anteverted normal size midline mobile nontender. Adnexa without masses or tenderness.  Assessment and plan: spotting one month after IUD placement. Trial of Megace failed. Options for management include observation, trial of estrogen was discussed. She is having some pelvic cramping. Her exam is benign. Am going to cover her for a low-grade endometritis with Vibramycin 100 mg twice a day x7 days. Also give her prophylactic Diflucan 150 as she tends to get yeast infections with antibiotics. Assuming cramping stops bleeding resolves and will follow. If it does persist she is to follow up with me.

## 2011-04-01 NOTE — Patient Instructions (Signed)
Take antibiotics as prescribed. Follow up if cramping or bleeding continues.

## 2011-05-16 ENCOUNTER — Other Ambulatory Visit: Payer: Self-pay | Admitting: Otolaryngology

## 2011-06-23 ENCOUNTER — Encounter (HOSPITAL_COMMUNITY): Payer: Self-pay | Admitting: Pharmacy Technician

## 2011-07-04 ENCOUNTER — Encounter (HOSPITAL_COMMUNITY): Payer: Self-pay

## 2011-07-04 ENCOUNTER — Ambulatory Visit (INDEPENDENT_AMBULATORY_CARE_PROVIDER_SITE_OTHER): Payer: 59 | Admitting: Gynecology

## 2011-07-04 ENCOUNTER — Encounter: Payer: Self-pay | Admitting: Gynecology

## 2011-07-04 ENCOUNTER — Encounter (HOSPITAL_COMMUNITY)
Admission: RE | Admit: 2011-07-04 | Discharge: 2011-07-04 | Disposition: A | Discharge status: Home / Self Care | Payer: 59 | Source: Ambulatory Visit | Attending: Specialist | Admitting: Specialist

## 2011-07-04 DIAGNOSIS — N760 Acute vaginitis: Secondary | ICD-10-CM

## 2011-07-04 DIAGNOSIS — M549 Dorsalgia, unspecified: Secondary | ICD-10-CM

## 2011-07-04 DIAGNOSIS — A499 Bacterial infection, unspecified: Secondary | ICD-10-CM

## 2011-07-04 DIAGNOSIS — N898 Other specified noninflammatory disorders of vagina: Secondary | ICD-10-CM

## 2011-07-04 DIAGNOSIS — N9089 Other specified noninflammatory disorders of vulva and perineum: Secondary | ICD-10-CM

## 2011-07-04 HISTORY — DX: Other specified postprocedural states: R11.2

## 2011-07-04 HISTORY — DX: Other specified postprocedural states: Z98.890

## 2011-07-04 LAB — CBC
MCV: 94.2 fL (ref 78.0–100.0)
Platelets: 249 10*3/uL (ref 150–400)
RBC: 3.97 MIL/uL (ref 3.87–5.11)
RDW: 12.1 % (ref 11.5–15.5)
WBC: 5.1 10*3/uL (ref 4.0–10.5)

## 2011-07-04 LAB — URINALYSIS W MICROSCOPIC + REFLEX CULTURE
Bilirubin Urine: NEGATIVE
Glucose, UA: NEGATIVE mg/dL
Hgb urine dipstick: NEGATIVE
Ketones, ur: NEGATIVE mg/dL
Protein, ur: NEGATIVE mg/dL
Urobilinogen, UA: 1 mg/dL (ref 0.0–1.0)

## 2011-07-04 LAB — SURGICAL PCR SCREEN
MRSA, PCR: NEGATIVE
Staphylococcus aureus: NEGATIVE

## 2011-07-04 LAB — WET PREP FOR TRICH, YEAST, CLUE
Clue Cells Wet Prep HPF POC: NONE SEEN
Trich, Wet Prep: NONE SEEN
Yeast Wet Prep HPF POC: NONE SEEN

## 2011-07-04 LAB — HCG, SERUM, QUALITATIVE: Preg, Serum: NEGATIVE

## 2011-07-04 MED ORDER — TINIDAZOLE 500 MG PO TABS: 2.0000 g | ORAL_TABLET | Freq: Once | ORAL | Status: AC

## 2011-07-04 NOTE — Progress Notes (Addendum)
Patient ID: Gabriella Sawyer, female   DOB: 1980-05-29, 31 y.o.   MRN: [id number] Patient presents with 2 issues. First is a vaginal discharge over the last week or so. Odor or itching. Also has a little bit of low back pain and mild urinary frequency. No fever chills nausea vomiting or other constitutional symptoms. Second issue is she has felt several bumps on her vulva. She picked one off and it bled and she wants me to take a look at these.  Exam with Selena Batten chaperone present Spine without CVA tenderness Abdomen soft nontender without masses guarding rebound organomegaly Pelvic external with small skin tag right mid labia majora, small skin tags x3 left lower labia minora/perineal body and a third more fleshy small raised area for clock position at the hymenal ring.  BUS vagina with whitish discharge. Cervix normal IUD string visualized. Uterus normal size midline mobile nontender. Adnexa without masses or tenderness.  Physical Exam  Genitourinary:      Assessment and plan: 1. #1 Discharge. Wet prep consistent with PVD. We'll treat with Tindamax 2 g daily x2 days. Alcohol avoidance reviewed. Follow up if symptoms persist or recur. 2. Vulvar lesions. 3 biopsies performed under Betadine cleanse, 1% lidocaine infiltration, silver nitrate hemostasis. Patient will follow up for biopsy results. Possibilities for HPV relationship reviewed in the issues of HPV discussed with her. 3. Low back pain/frequency. Her UA is negative as is her exam.  Will monitor at present. If symptoms persist she will represent for further evaluation.

## 2011-07-04 NOTE — Patient Instructions (Addendum)
20 AVALEY COOP  07/04/2011   Your procedure is scheduled on:  07-08-11  Report to [facility] at 1030  AM.  Call this number if you have problems the morning of surgery: [phone]   Remember: bring driver spouse laramie cell [phone]   Do not eat food or drink liquids:After Midnight.  .  Take these medicines the morning of surgery with A SIP OF WATER: no meds to take   Do not wear jewelry or make up.  Do not wear lotions, powders, or perfumes.Do not wear deodorant.    Do not bring valuables to the hospital.  Contacts, dentures or bridgework may not be worn into surgery.  Leave suitcase in the car. After surgery it may be brought to your room.  For patients admitted to the hospital, checkout time is 11:00 AM the day of discharge.     Special Instructions: CHG Shower Use Special Wash: 1/2 bottle night before surgery and 1/2 bottle morning of surgery.neck down avoid private area, no shaving for 2 days before showers   Please read over the following fact sheets that you were given: MRSA Information  Cain Sieve [facility] pre op nurse phone number [phone], call if needed

## 2011-07-04 NOTE — Progress Notes (Signed)
Addended by: Dara Lords on: 07/04/2011 11:21 AM   Modules accepted: Orders

## 2011-07-04 NOTE — Patient Instructions (Signed)
Take antibiotics as prescribed. No alcohol while taking the antibiotics. Follow up for biopsy results.

## 2011-07-04 NOTE — Progress Notes (Signed)
Addended by: Dara Lords on: 07/04/2011 11:56 AM   Modules accepted: Orders

## 2011-07-05 ENCOUNTER — Encounter: Payer: Self-pay | Admitting: Gynecology

## 2011-07-08 ENCOUNTER — Encounter (HOSPITAL_COMMUNITY)
Admission: RE | Disposition: A | Discharge status: Home / Self Care | Payer: Self-pay | Source: Ambulatory Visit | Attending: Specialist

## 2011-07-08 ENCOUNTER — Ambulatory Visit (HOSPITAL_COMMUNITY): Payer: 59 | Admitting: Anesthesiology

## 2011-07-08 ENCOUNTER — Ambulatory Visit (HOSPITAL_BASED_OUTPATIENT_CLINIC_OR_DEPARTMENT_OTHER): Admission: RE | Admit: 2011-07-08 | Payer: 59 | Source: Ambulatory Visit | Admitting: Specialist

## 2011-07-08 ENCOUNTER — Ambulatory Visit (HOSPITAL_COMMUNITY)
Admission: RE | Admit: 2011-07-08 | Discharge: 2011-07-08 | Disposition: A | Discharge status: Home / Self Care | Payer: 59 | Source: Ambulatory Visit | Attending: Specialist | Admitting: Specialist

## 2011-07-08 ENCOUNTER — Encounter (HOSPITAL_COMMUNITY): Payer: Self-pay | Admitting: Anesthesiology

## 2011-07-08 ENCOUNTER — Encounter (HOSPITAL_COMMUNITY): Payer: Self-pay | Admitting: *Deleted

## 2011-07-08 DIAGNOSIS — Z01812 Encounter for preprocedural laboratory examination: Secondary | ICD-10-CM | POA: Insufficient documentation

## 2011-07-08 DIAGNOSIS — S83249A Other tear of medial meniscus, current injury, unspecified knee, initial encounter: Secondary | ICD-10-CM

## 2011-07-08 DIAGNOSIS — M238X9 Other internal derangements of unspecified knee: Secondary | ICD-10-CM | POA: Insufficient documentation

## 2011-07-08 DIAGNOSIS — M2240 Chondromalacia patellae, unspecified knee: Secondary | ICD-10-CM | POA: Insufficient documentation

## 2011-07-08 DIAGNOSIS — M23319 Other meniscus derangements, anterior horn of medial meniscus, unspecified knee: Secondary | ICD-10-CM | POA: Insufficient documentation

## 2011-07-08 SURGERY — ARTHROSCOPY, KNEE, WITH LATERAL RETINACULUM RELEASE
Anesthesia: General | Site: Knee | Laterality: Right | Wound class: Clean

## 2011-07-08 SURGERY — ARTHROSCOPY, KNEE
Anesthesia: General | Laterality: Right

## 2011-07-08 MED ORDER — ONDANSETRON HCL 4 MG/2ML IJ SOLN
INTRAMUSCULAR | Status: DC | PRN
  Administered 2011-07-08: 4 mg via INTRAVENOUS

## 2011-07-08 MED ORDER — HYDROCODONE-ACETAMINOPHEN 10-325 MG PO TABS
ORAL_TABLET | ORAL | Status: AC
  Administered 2011-07-08: 1 via ORAL
  Filled 2011-07-08: qty 1

## 2011-07-08 MED ORDER — HYDROCODONE-ACETAMINOPHEN 10-325 MG PO TABS
1.0000 | ORAL_TABLET | ORAL | Status: DC | PRN
  Administered 2011-07-08: 1 via ORAL

## 2011-07-08 MED ORDER — BUPIVACAINE-EPINEPHRINE 0.5% -1:200000 IJ SOLN
INTRAMUSCULAR | Status: DC | PRN
  Administered 2011-07-08: 30 mL

## 2011-07-08 MED ORDER — LACTATED RINGERS IV SOLN: INTRAVENOUS | Status: DC

## 2011-07-08 MED ORDER — PROPOFOL 10 MG/ML IV BOLUS
INTRAVENOUS | Status: DC | PRN
  Administered 2011-07-08: 200 mg via INTRAVENOUS

## 2011-07-08 MED ORDER — CEFAZOLIN SODIUM-DEXTROSE 2-3 GM-% IV SOLR
2.0000 g | INTRAVENOUS | Status: AC
  Administered 2011-07-08: 2 g via INTRAVENOUS

## 2011-07-08 MED ORDER — HYDROMORPHONE HCL PF 1 MG/ML IJ SOLN: 0.2500 mg | INTRAMUSCULAR | Status: DC | PRN

## 2011-07-08 MED ORDER — LIDOCAINE HCL (CARDIAC) 20 MG/ML IV SOLN
INTRAVENOUS | Status: DC | PRN
  Administered 2011-07-08: 50 mg via INTRAVENOUS

## 2011-07-08 MED ORDER — SCOPOLAMINE 1 MG/3DAYS TD PT72
MEDICATED_PATCH | TRANSDERMAL | Status: DC | PRN
  Administered 2011-07-08: 1.5 mg via TRANSDERMAL

## 2011-07-08 MED ORDER — PROMETHAZINE HCL 25 MG/ML IJ SOLN: 6.2500 mg | INTRAMUSCULAR | Status: DC | PRN

## 2011-07-08 MED ORDER — MEPERIDINE HCL 50 MG/ML IJ SOLN: 6.2500 mg | INTRAMUSCULAR | Status: DC | PRN

## 2011-07-08 MED ORDER — LACTATED RINGERS IV SOLN
INTRAVENOUS | Status: DC
  Administered 2011-07-08 (×2): via INTRAVENOUS

## 2011-07-08 MED ORDER — SCOPOLAMINE 1 MG/3DAYS TD PT72
MEDICATED_PATCH | TRANSDERMAL | Status: AC
  Filled 2011-07-08: qty 1

## 2011-07-08 MED ORDER — EPINEPHRINE HCL 1 MG/ML IJ SOLN
INTRAMUSCULAR | Status: AC
  Filled 2011-07-08: qty 2

## 2011-07-08 MED ORDER — MIDAZOLAM HCL 5 MG/5ML IJ SOLN
INTRAMUSCULAR | Status: DC | PRN
  Administered 2011-07-08: 2 mg via INTRAVENOUS

## 2011-07-08 MED ORDER — CEFAZOLIN SODIUM 1-5 GM-% IV SOLN
INTRAVENOUS | Status: AC
  Filled 2011-07-08: qty 100

## 2011-07-08 MED ORDER — LACTATED RINGERS IR SOLN
Status: DC | PRN
  Administered 2011-07-08: 3000 mL

## 2011-07-08 MED ORDER — DEXAMETHASONE SODIUM PHOSPHATE 4 MG/ML IJ SOLN
INTRAMUSCULAR | Status: DC | PRN
  Administered 2011-07-08: 10 mg via INTRAVENOUS

## 2011-07-08 MED ORDER — HYDROCODONE-ACETAMINOPHEN 7.5-325 MG PO TABS: 1.0000 | ORAL_TABLET | ORAL | Status: AC | PRN

## 2011-07-08 MED ORDER — BUPIVACAINE-EPINEPHRINE (PF) 0.5% -1:200000 IJ SOLN
INTRAMUSCULAR | Status: AC
  Filled 2011-07-08: qty 10

## 2011-07-08 MED ORDER — EPINEPHRINE HCL 1 MG/ML IJ SOLN
INTRAMUSCULAR | Status: DC | PRN
  Administered 2011-07-08: 2 mL

## 2011-07-08 MED ORDER — DROPERIDOL 2.5 MG/ML IJ SOLN
INTRAMUSCULAR | Status: DC | PRN
  Administered 2011-07-08: 0.625 mg via INTRAVENOUS

## 2011-07-08 MED ORDER — FENTANYL CITRATE 0.05 MG/ML IJ SOLN
INTRAMUSCULAR | Status: DC | PRN
  Administered 2011-07-08 (×4): 50 ug via INTRAVENOUS

## 2011-07-08 SURGICAL SUPPLY — 22 items
BLADE 4.2CUDA (BLADE) IMPLANT
BLADE CUDA SHAVER 3.5 (BLADE) ×3 IMPLANT
CLOTH BEACON ORANGE TIMEOUT ST (SAFETY) ×3 IMPLANT
DRSG ADAPTIC 3X8 NADH LF (GAUZE/BANDAGES/DRESSINGS) ×2 IMPLANT
DURAPREP 26ML APPLICATOR (WOUND CARE) ×3 IMPLANT
ELECT REM PT RETURN 9FT ADLT (ELECTROSURGICAL) ×3
ELECTRODE REM PT RTRN 9FT ADLT (ELECTROSURGICAL) ×2 IMPLANT
GLOVE BIOGEL PI IND STRL 8 (GLOVE) ×2 IMPLANT
GLOVE BIOGEL PI INDICATOR 8 (GLOVE) ×1
GLOVE SURG SS PI 8.0 STRL IVOR (GLOVE) ×6 IMPLANT
GOWN PREVENTION PLUS LG XLONG (DISPOSABLE) ×3 IMPLANT
GOWN STRL REIN XL XLG (GOWN DISPOSABLE) ×3 IMPLANT
IV LACTATED RINGER IRRG 3000ML (IV SOLUTION) ×9
IV LR IRRIG 3000ML ARTHROMATIC (IV SOLUTION) ×3 IMPLANT
MANIFOLD NEPTUNE II (INSTRUMENTS) ×6 IMPLANT
PACK ARTHROSCOPY WL (CUSTOM PROCEDURE TRAY) ×3 IMPLANT
PADDING CAST COTTON 6X4 STRL (CAST SUPPLIES) ×3 IMPLANT
SET ARTHROSCOPY TUBING (MISCELLANEOUS) ×3
SET ARTHROSCOPY TUBING LN (MISCELLANEOUS) ×2 IMPLANT
SUT ETHILON 4 0 PS 2 18 (SUTURE) ×3 IMPLANT
WAND 30 DEG SABER W/CORD (SURGICAL WAND) ×2 IMPLANT
WRAP KNEE MAXI GEL POST OP (GAUZE/BANDAGES/DRESSINGS) ×3 IMPLANT

## 2011-07-08 NOTE — Anesthesia Postprocedure Evaluation (Signed)
Anesthesia Post Note  Patient: Gabriella Sawyer  Procedure(s) Performed: Procedure(s) (LRB): KNEE ARTHROSCOPY WITH LATERAL RELEASE (Right)  Anesthesia type: General  Patient location: PACU  Post pain: Pain level controlled  Post assessment: Post-op Vital signs reviewed  Last Vitals:  Filed Vitals:   07/08/11 1445  BP:   Pulse:   Temp: 36.8 C  Resp:     Post vital signs: Reviewed  Level of consciousness: sedated  Complications: No apparent anesthesia complications

## 2011-07-08 NOTE — H&P (Signed)
Gabriella Sawyer is an 31 y.o. female.   Chief Complaint: Right knee pain  HPI: knee pain refractory.   Past Medical History  Diagnosis Date  . PCOS (polycystic ovarian syndrome)   . PONV (postoperative nausea and vomiting)   . VAIN I (vaginal intraepithelial neoplasia grade I) 07/2011    at hymenal ring 4 oclock    Past Surgical History  Procedure Date  . Foot surgery 2001    left foot, screw inserted  . Roux-en-y procedure 2009    GASTRIC BYPASS  . Wisdom tooth extraction yrs ago  . Intrauterine device insertion 06/2005 & 02/2011  . Cesarean section 2004    x2 WITH BTL  . Tonsillectomy 1995  . Cholecystectomy 2010    Family History  Problem Relation Age of Onset  . Diabetes Father   . Hypertension Father   . Cancer Maternal Grandmother     COLON CANCER  . Diabetes Paternal Grandfather    Social History:  reports that she has never smoked. She has never used smokeless tobacco. She reports that she does not drink alcohol or use illicit drugs.  Allergies:  Allergies  Allergen Reactions  . Codeine Nausea And Vomiting  . Shrimp (Shellfish Allergy) Anaphylaxis    Medications Prior to Admission  Medication Dose Route Frequency Provider Last Rate Last Dose  . ceFAZolin (ANCEF) IVPB 2 g/50 mL premix  2 g Intravenous 60 min Pre-Op Javier Docker, MD      . lactated ringers infusion   Intravenous Continuous Liam Graham, PA       Medications Prior to Admission  Medication Sig Dispense Refill  . Calcium Carbonate-Vitamin D (CALCIUM + D PO) Take 2 tablets by mouth daily. Held all vitamins for 10 days      . Cyanocobalamin (B-12 PO) Place 1 tablet under the tongue daily.       . Multiple Vitamin (MULTIVITAMIN PO) Take 2 tablets by mouth daily.       Marland Kitchen EPINEPHrine (EPI-PEN) 0.3 mg/0.3 mL DEVI Inject 0.3 mg into the muscle once as needed. reaction        No results found for this or any previous visit (from the past 48 hour(s)). No results found.  Review of Systems    Musculoskeletal: Positive for joint pain.  All other systems reviewed and are negative.    Blood pressure 110/71, pulse 70, temperature 98.7 F (37.1 C), temperature source Oral, resp. rate 18, SpO2 100.00%. Physical Exam  Constitutional: She appears well-developed.  HENT:  Head: Normocephalic.  Eyes: Pupils are equal, round, and reactive to light.  Neck: Normal range of motion.  Cardiovascular: Normal rate.   Respiratory: Effort normal.  GI: Soft.  Musculoskeletal:       MJLT. Lat track patella. Medial retinacular pain. Mils effusion. Mils lateral patellar pain. Equiv Mcmurray. SLR negative.  Neurological: She is alert.  Skin: Skin is warm.  Psychiatric: She has a normal mood and affect.   MRI lat track patella. chondromalacia. Assessment/Plan Right knee pain lat track patella. Chondromalacia vs occult meniscus tear refractory. Plan knee arthroscopy. Risks discussed.  Buck Mcaffee C 07/08/2011, 1:14 PM

## 2011-07-08 NOTE — Transfer of Care (Signed)
Immediate Anesthesia Transfer of Care Note  Patient: Gabriella Sawyer  Procedure(s) Performed: Procedure(s) (LRB): KNEE ARTHROSCOPY WITH LATERAL RELEASE (Right)  Patient Location: PACU  Anesthesia Type: General  Level of Consciousness: sedated, patient cooperative and responds to stimulaton  Airway & Oxygen Therapy: Patient Spontanous Breathing and Patient connected to face mask oxgen  Post-op Assessment: Report given to PACU RN and Post -op Vital signs reviewed and stable  Post vital signs: Reviewed and stable  Complications: No apparent anesthesia complications

## 2011-07-08 NOTE — Discharge Instructions (Signed)
Knee, Cartilage (Meniscus) Injury  It is suspected that you have a torn cartilage (meniscus) in your knee. The menisci are made of tough cartilage, and fit between the surfaces of the thigh and leg bones. The menisci are "C"shaped and have a wedged profile. The wedged profile helps the stability of the joint by keeping the rounded femur surface from sliding off the flat tibial surface. The menisci are fed (nourished) by small blood vessels; but there is also a large area at the inner edge of the meniscus that does not have a good blood supply (avascular). This presents a problem when there is an injury to the meniscus, because areas without good blood supply heal poorly. As a result when there is a torn cartilage in the knee, surgery is often required to fix it. This is usually done with a surgical procedure less invasive than open surgery (arthroscopy). Some times open surgery of the knee is required if there is other damage.  PURPOSE OF THE MENISCUS  The medial meniscus rests on the medial tibial plateau. The tibia is the large bone in your lower leg (the shin bone). The medial tibial plateau is the upper end of the bone making up the inner part of your knee. The lateral meniscus serves the same purpose and is located on the outside of the knee. The menisci help to distribute your body weight across the knee joint; they act as shock absorbers. Without the meniscus present, the weight of your body would be unevenly applied to the bones in your legs (the femur and tibia). The femur is the large bone in your thigh. This uneven weight distribution would cause increased wear and tear on the cartilage lining the joint surfaces, leading to early damage (arthritis) of these areas. The presence of the menisci cartilage is necessary for a healthy knee.  PURPOSE OF THE KNEE CARTILAGE  The knee joint is made up of three bones: the thigh bone (femur), the shin bone (tibia), and the knee cap (patella). The surfaces of these  bones at the knee joint are covered with cartilage called articular cartilage. This smooth slippery surface allows the bones to slide against each other without causing bone damage. The meniscus sits between these cartilaginous surfaces of the bones. It distributes the weight evenly in the joints and helps with the stability of the joint (keeps the joint steady).  HOME CARE INSTRUCTIONS   Use crutches and external braces as instructed.   Once home an ice pack applied to your injured knee may help with discomfort and keep the swelling down. An ice pack can be used for the first couple of days or as instructed.   Only take over-the-counter or prescription medicines for pain, discomfort, or fever as directed by your caregiver.   Call if you do not have relief of pain with medications or if there is increasing in pain.   Call if your foot becomes cold or blue.   You may resume normal diet and activities as directed.   Make sure to keep your appointment with your follow-up caregiver. This injury may require further evaluation and treatment beyond the temporary treatment given today.  Document Released: 06/11/2002 Document Revised: 03/10/2011 Document Reviewed: 10/03/2008  [employer] Patient Information 2012 [employer].

## 2011-07-08 NOTE — Brief Op Note (Signed)
07/08/2011  2:38 PM  PATIENT:  Jenne Campus  31 y.o. female  PRE-OPERATIVE DIAGNOSIS:  Osteoarthritis Chondromalacia right knee  POST-OPERATIVE DIAGNOSIS:  Osteoarthritis Chondromalacia, torn medial meniscus right knee  PROCEDURE:  Procedure(s) (LRB): KNEE ARTHROSCOPY WITH LATERAL RELEASE (Right)  SURGEON:  Surgeon(s) and Role:    * Javier Docker, MD - Primary  PHYSICIAN ASSISTANT:   ASSISTANTS: none   ANESTHESIA:   general  EBL:  Total I/O In: 1000 [I.V.:1000] Out: -   BLOOD ADMINISTERED:none  DRAINS: none   LOCAL MEDICATIONS USED:  MARCAINE     SPECIMEN:  No Specimen  DISPOSITION OF SPECIMEN:  N/A  COUNTS:  YES  TOURNIQUET:  * No tourniquets in log *  DICTATION: .Other Dictation: Dictation Number [id number]  PLAN OF CARE: Discharge to home after PACU  PATIENT DISPOSITION:  PACU - hemodynamically stable.   Delay start of Pharmacological VTE agent (>24hrs) due to surgical blood loss or risk of bleeding: no

## 2011-07-08 NOTE — Anesthesia Preprocedure Evaluation (Addendum)
Anesthesia Evaluation  Patient identified by MRN, date of birth, ID band Patient awake    Reviewed: Allergy & Precautions, H&P , NPO status , Patient's Chart, lab work & pertinent test results  History of Anesthesia Complications (+) PONV  Airway Mallampati: I TM Distance: >3 FB Neck ROM: Full    Dental No notable dental hx.    Pulmonary neg pulmonary ROS,  breath sounds clear to auscultation  Pulmonary exam normal       Cardiovascular negative cardio ROS  Rhythm:Regular Rate:Normal     Neuro/Psych negative neurological ROS  negative psych ROS   GI/Hepatic negative GI ROS, Neg liver ROS,   Endo/Other  negative endocrine ROS  Renal/GU negative Renal ROS  negative genitourinary   Musculoskeletal negative musculoskeletal ROS (+)   Abdominal   Peds negative pediatric ROS (+)  Hematology negative hematology ROS (+)   Anesthesia Other Findings   Reproductive/Obstetrics negative OB ROS                           Anesthesia Physical Anesthesia Plan  ASA: II  Anesthesia Plan: General   Post-op Pain Management:    Induction: Intravenous  Airway Management Planned:   Additional Equipment:   Intra-op Plan:   Post-operative Plan: Extubation in OR  Informed Consent: I have reviewed the patients History and Physical, chart, labs and discussed the procedure including the risks, benefits and alternatives for the proposed anesthesia with the patient or authorized representative who has indicated his/her understanding and acceptance.   Dental advisory given  Plan Discussed with: CRNA  Anesthesia Plan Comments:         Anesthesia Quick Evaluation

## 2011-07-09 NOTE — Op Note (Signed)
NAMELAVERNE, HURSEY NO.:  [id number]  MEDICAL RECORD NO.:  [id number]  LOCATION:  [facility]                         FACILITY:  [facility]  PHYSICIAN:  Jene Every, M.D.    DATE OF BIRTH:  [birth year]  DATE OF PROCEDURE:  07/08/2011 DATE OF DISCHARGE:  07/08/2011                              OPERATIVE REPORT   PREOPERATIVE DIAGNOSES:  Chondromalacia, patellofemoral joint, medial meniscus; lateral tracking patella.  POSTOPERATIVE DIAGNOSES:  Chondromalacia, patellofemoral joint, medial meniscus; lateral tracking patella.  PROCEDURE PERFORMED: 1. Right knee arthroscopy. 2. Partial medial meniscectomy. 3. Chondroplasty of the patellofemoral joint. 4. Lateral retinacular release.  ANESTHESIA:  General.  ASSISTANT:  No assistant.  HISTORY:  This is a 31 year old who has had unfortunately refractory right knee pain and patellofemoral pain, but also medial joint line pain.  MRI indicating severe chondromalacia of the patellofemoral joint and some lateral tracking of the patella.  She underwent therapy for VMO strengthening, activity modification, corticosteroid injections, and Visco supplementation with only temporary relief of symptoms.  She was indicated for diagnostic arthroscopy, evaluation of the patellofemoral joint, and chondroplasty with possible lateral release.  In evaluation of the medial compartment, __________ symptoms call meniscus tear. Risks and benefits discussed including bleeding, infection, no change in symptoms, worsening symptoms, need for repeat debridement, DVT, PE, anesthetic complications, etc.  TECHNIQUE:  With the patient in supine position, after induction of adequate anesthesia, 2 g Kefzol; the right lower extremity was prepped and draped in usual sterile fashion.  Examination under anesthesia revealed full range of motion, no instability, some lateral tracking of the patella.  I was not able to dislocate, were subluxed medially. There  was some tilt to that.  A lateral parapatellar portal was fashioned with a #11 blade and Ingress cannula.  Superior and medial parapatellar portals were fashioned with a #11 blade.  Ingress cannula atraumatically placed.  Irrigant was utilized to insufflate the joint. Under direct visualization, a medial parapatellar portal was fashioned with a #11 blade after localization with an 18-gauge needle sparing the medial meniscus.  Noted was extensive fraying of the anterior horn of medial meniscus with synovitis and a small tear of the anterior horn. The femoral condyle and tibial plateau were unremarkable.  Examination of the lateral compartment revealed normal femoral condyle, tibial plateau, and meniscus stable to probe palpation without evidence of tear.  ACL was unremarkable.  Suprapatellar pouch revealed extensive chondral changes of the lateral femoral sulcus of the patella, there was tilt of the patella; and with flexion extension appeared to be impinging over the lateral portion of the sulcus.  There was retinaculum and tethering of the patella noted as well; and again, I could not sublux it medially.  I felt this was consistent with the pathology.  She had a nonpathologic plica medially. Performed a light chondroplasty of the patella and of the lateral portion of the femoral sulcus, but prior to that, I performed a lateral release from the superior pole of the patella to the inferior pole of the patella.  Using an ArthroWand, going through the retinaculum only preserving the neurovascular structures to the patella, only superolateral aspect of that after that was  then defined.  This improved the tilt, I was able to tilt the patella laterally after the release; and again, I was not able dislocate it medially.  There was significant improved patellofemoral tracking.  The gutters were unremarkable.  Next, I decreased the intra-articular pressure from 65 to 15.  There was no  significant bleeding, re-insufflated.  I examined all compartments, no further pathology, amenable arthroscopic intervention.  I therefore, removed all instrumentation.  Portals were closed with 4-0 nylon simple sutures.  0.25% Marcaine with epinephrine was infiltrated in the joint. Wound was dressed sterilely, awoken without difficulty, and transported to the recovery room in satisfactory condition.  The patient tolerated the procedure well.  No complications.     Jene Every, M.D.     Cordelia Pen  D:  07/08/2011  T:  07/09/2011  Job:  [id number]

## 2011-07-21 ENCOUNTER — Encounter: Payer: 59 | Attending: Surgery | Admitting: *Deleted

## 2011-07-21 DIAGNOSIS — Z09 Encounter for follow-up examination after completed treatment for conditions other than malignant neoplasm: Secondary | ICD-10-CM | POA: Insufficient documentation

## 2011-07-21 DIAGNOSIS — Z713 Dietary counseling and surveillance: Secondary | ICD-10-CM | POA: Insufficient documentation

## 2011-07-21 DIAGNOSIS — Z9884 Bariatric surgery status: Secondary | ICD-10-CM | POA: Insufficient documentation

## 2011-07-21 NOTE — Progress Notes (Signed)
Follow-up visit:  3.5 Years Post-Operative RYGB Surgery  Medical Nutrition Therapy:  Appt start time: 1200   End time:  1230.  Primary concerns today:  Post-operative bariatric surgery nutrition management. Gabriella Sawyer returns today for f/u after a 2 year hiatus from [facility].  States she is worried she does not know all the nutritional guidelines she needs to be following. Is interested in losing more weight, but does not specify a goal at this time. Has returned to drinking carbonated, sugar-sweetened drinks in the last few weeks. Reports she eats out often and has not been taking supplements as instructed. Has recently had knee surgery and not very active. Discussed getting back on track.   Surgery date: 01/22/08 Start weight @ [facility]: 271.4 lbs  Weight today: 178.0 lbs Weight change: n/a Total weight lost: 93.4 lbs BMI: 32.5%  TANITA  BODY COMP RESULTS  07/21/11   %Fat 30.0%   FM (lbs) 53.5   FFM (lbs) 124.5   TBW (lbs) 91.0    24-Hour Dietary Recall:  Not obtained d/t pt being late for appt. Reports drinking 16 oz of regular coke 2-3 times/week and sweet tea (1/2 is unsweet). Skips breakfast often.   Fluid intake: >60 oz Estimated total protein intake: Reports she is "getting enough protein"  Medications: See medication list.  Supplementation: Not taking regularly.   Using straws: No Drinking while eating: Some Hair loss: Not reported Carbonated beverages: Yes N/V/D/C: No Dumping syndrome: No  Recent physical activity:  None d/t recent knee surgery  Progress Towards Goal(s):  In progress.  Handouts given during visit include:  Modified Bariatric Surgery Post-Op diet  Bariatric Surgery Fast Food Guide   Nutritional Diagnosis:  NB-1.1 Food and nutrition-related knowledge deficit related to gastric bypass surgery as evidenced by patient-reported lack of knowledge regarding post-op bariatric surgery guidelines.    Intervention:  Nutrition  education/reinforcement.  Monitoring/Evaluation:  Dietary intake, exercise, lap band fills, and body weight. Follow up prn.

## 2011-07-24 ENCOUNTER — Encounter: Payer: Self-pay | Admitting: *Deleted

## 2011-07-24 NOTE — Progress Notes (Signed)
Samples Dispensed to pt at [facility] (07/21/11).   Celebrate Vitamins 1)  Calcium Citrate  Hot Cocoa: 4 ea  (Lot # [id number]; Exp: 07/13)  Valentino Saxon Tart: 2 ea (Lot # [id number]; Exp: 06/13)  Berries & Cream: 2 ea (Lot #  [id number]) Multivitamin  Orange: 2 ea  Forest Allyson Sabal: 2 ea  Pineapple-Strawberry: 1 ea  Opurity MVI Bypass/Sleeve 1)  Orange Berry: 1 ea  (Lot # [id number]; Exp: 11/14)  Bariatric Advantage Vitamins 1) Calcium Citrate  Crystals: 3 ea (Lot # [id number]; Exp: 03/14)  Valentino Saxon Lozenge: 2 ea (Lot # [id number]; Exp: 12/13)  Chocolate Lozenge: 2 ea (Lot # [id number]; Exp: 10/13)  Cinnamon Lozenge: 2 ea (Lot # [id number]; Exp: 09/13) 2) Multivitamin  Berry: 2 ea (Lot # [id number]; Exp: 09/13)  Orange: 2 ea (Lot # [id number]; Exp: 09/13)

## 2011-07-24 NOTE — Patient Instructions (Signed)
Goals:  Follow Phase 3B: High Protein + Non-Starchy Vegetables or start Modified Bariatric Surgery Post-Op diet (see handout).  Eat 3-6 small meals/snacks, every 3-5 hrs  Increase lean protein foods to meet 60-80g goal  Increase fluid intake to 64oz +  Add 15 grams of carbohydrate (fruit, whole grain, starchy vegetable) with meals  Avoid drinking 15 minutes before, during and 30 minutes after eating  Aim for >30 min of physical activity daily

## 2011-07-29 ENCOUNTER — Encounter: Payer: Self-pay | Admitting: Gynecology

## 2011-07-29 ENCOUNTER — Ambulatory Visit (INDEPENDENT_AMBULATORY_CARE_PROVIDER_SITE_OTHER): Payer: 59 | Admitting: Gynecology

## 2011-07-29 VITALS — BP 118/70

## 2011-07-29 DIAGNOSIS — Z833 Family history of diabetes mellitus: Secondary | ICD-10-CM

## 2011-07-29 DIAGNOSIS — N893 Dysplasia of vagina, unspecified: Secondary | ICD-10-CM

## 2011-07-29 DIAGNOSIS — Z113 Encounter for screening for infections with a predominantly sexual mode of transmission: Secondary | ICD-10-CM

## 2011-07-29 DIAGNOSIS — B373 Candidiasis of vulva and vagina: Secondary | ICD-10-CM

## 2011-07-29 DIAGNOSIS — N890 Mild vaginal dysplasia: Secondary | ICD-10-CM

## 2011-07-29 DIAGNOSIS — M545 Low back pain, unspecified: Secondary | ICD-10-CM

## 2011-07-29 DIAGNOSIS — N898 Other specified noninflammatory disorders of vagina: Secondary | ICD-10-CM

## 2011-07-29 DIAGNOSIS — B3731 Acute candidiasis of vulva and vagina: Secondary | ICD-10-CM

## 2011-07-29 LAB — URINALYSIS W MICROSCOPIC + REFLEX CULTURE
Casts: NONE SEEN
Crystals: NONE SEEN
Glucose, UA: NEGATIVE mg/dL
Hgb urine dipstick: NEGATIVE
Leukocytes, UA: NEGATIVE
Nitrite: NEGATIVE
Specific Gravity, Urine: <1.005 — ABNORMAL LOW (ref 1.005–1.030)
Squamous Epithelial / LPF: NEGATIVE
pH: 6 (ref 5.0–8.0)

## 2011-07-29 LAB — WET PREP FOR TRICH, YEAST, CLUE

## 2011-07-29 LAB — HEMOGLOBIN A1C: Mean Plasma Glucose: 100 mg/dL (ref <117)

## 2011-07-29 MED ORDER — FLUCONAZOLE 100 MG PO TABS: 100.0000 mg | ORAL_TABLET | Freq: Every day | ORAL | Status: AC

## 2011-07-29 NOTE — Patient Instructions (Signed)
Candida Infection, Adult A candida infection (also called yeast, fungus and Monilia infection) is an overgrowth of yeast that can occur anywhere on the body. A yeast infection commonly occurs in warm, moist body areas. Usually, the infection remains localized but can spread to become a systemic infection. A yeast infection may be a sign of a more severe disease such as diabetes, leukemia, or AIDS. A yeast infection can occur in both men and women. In women, Candida vaginitis is a vaginal infection. It is one of the most common causes of vaginitis. Men usually do not have symptoms or know they have an infection until other problems develop. Men may find out they have a yeast infection because their sex partner has a yeast infection. Uncircumcised men are more likely to get a yeast infection than circumcised men. This is because the uncircumcised glans is not exposed to air and does not remain as dry as that of a circumcised glans. Older adults may develop yeast infections around dentures. CAUSES  Women  Antibiotics.   Steroid medication taken for a long time.   Being overweight (obese).   Diabetes.   Poor immune condition.   Certain serious medical conditions.   Immune suppressive medications for organ transplant patients.   Chemotherapy.   Pregnancy.   Menstration.   Stress and fatigue.   Intravenous drug use.   Oral contraceptives.   Wearing tight-fitting clothes in the crotch area.   Catching it from a sex partner who has a yeast infection.   Spermicide.   Intravenous, urinary, or other catheters.  Men  Catching it from a sex partner who has a yeast infection.   Having oral or anal sex with a person who has the infection.   Spermicide.   Diabetes.   Antibiotics.   Poor immune system.   Medications that suppress the immune system.   Intravenous drug use.   Intravenous, urinary, or other catheters.  SYMPTOMS  Women  Thick, white vaginal discharge.    Vaginal itching.   Redness and swelling in and around the vagina.   Irritation of the lips of the vagina and perineum.   Blisters on the vaginal lips and perineum.   Painful sexual intercourse.   Low blood sugar (hypoglycemia).   Painful urination.   Bladder infections.   Intestinal problems such as constipation, indigestion, bad breath, bloating, increase in gas, diarrhea, or loose stools.  Men  Men may develop intestinal problems such as constipation, indigestion, bad breath, bloating, increase in gas, diarrhea, or loose stools.   Dry, cracked skin on the penis with itching or discomfort.   Jock itch.   Dry, flaky skin.   Athlete's foot.   Hypoglycemia.  DIAGNOSIS  Women  A history and an exam are performed.   The discharge may be examined under a microscope.   A culture may be taken of the discharge.  Men  A history and an exam are performed.   Any discharge from the penis or areas of cracked skin will be looked at under the microscope and cultured.   Stool samples may be cultured.  TREATMENT  Women  Vaginal antifungal suppositories and creams.   Medicated creams to decrease irritation and itching on the outside of the vagina.   Warm compresses to the perineal area to decrease swelling and discomfort.   Oral antifungal medications.   Medicated vaginal suppositories or cream for repeated or recurrent infections.   Wash and dry the irritation areas before applying the cream.     Eating yogurt with lactobacillus may help with prevention and treatment.   Sometimes painting the vagina with gentian violet solution may help if creams and suppositories do not work.  Men  Antifungal creams and oral antifungal medications.   Sometimes treatment must continue for 30 days after the symptoms go away to prevent recurrence.  HOME CARE INSTRUCTIONS  Women  Use cotton underwear and avoid tight-fitting clothing.   Avoid colored, scented toilet paper and  deodorant tampons or pads.   Do not douche.   Keep your diabetes under control.   Finish all the prescribed medications.   Keep your skin clean and dry.   Consume milk or yogurt with lactobacillus active culture regularly. If you get frequent yeast infections and think that is what the infection is, there are over-the-counter medications that you can get. If the infection does not show healing in 3 days, talk to your caregiver.   Tell your sex partner you have a yeast infection. Your partner may need treatment also, especially if your infection does not clear up or recurs.  Men  Keep your skin clean and dry.   Keep your diabetes under control.   Finish all prescribed medications.   Tell your sex partner that you have a yeast infection so they can be treated if necessary.  SEEK MEDICAL CARE IF:   Your symptoms do not clear up or worsen in one week after treatment.   You have an oral temperature above 102 F (38.9 C).   You have trouble swallowing or eating for a prolonged time.   You develop blisters on and around your vagina.   You develop vaginal bleeding and it is not your menstrual period.   You develop abdominal pain.   You develop intestinal problems as mentioned above.   You get weak or lightheaded.   You have painful or increased urination.   You have pain during sexual intercourse.  MAKE SURE YOU:   Understand these instructions.   Will watch your condition.   Will get help right away if you are not doing well or get worse.  Document Released: 04/28/2004 Document Revised: 03/10/2011 Document Reviewed: 08/10/2009 ExitCare Patient Information 2012 [employer]. 

## 2011-07-29 NOTE — Progress Notes (Signed)
Patient presented to the office today with several complaints. One of her complaints today was a white vaginal discharge and the second complaining of some vague low back discomfort and a third complaint of new vaginal lesions. She is status post recent knee surgery. She was in the impression that today she could have some "skin tags" removed along with the recommended colposcopic evaluation as a result of recent biopsy by Dr. 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 His recent biopsy demonstrated the following pathological result:  Diagnosis 1. Vulva, biopsy, right labia - [place]. 2. Vulva, biopsy, left labia - [place] 3. Vagina, biopsy, hymenal ring - VAGINAL INTRAEPITHELIAL NEOPLASIA-I (VAIN-I).  A wet prep and GC and Chlamydia culture was obtained today few WBC and few bacteria were noted in rare yeast.  Vulvar and vaginal inspection demonstrated the following:  Physical Exam  Genitourinary:      Patient was once again informed of the findings from past biopsies by Dr. Audie Box and his recommendation to followup with colposcopy. She will make an appointment to see him back in one to 2 weeks. Patient states that she has  recurrent yeast infections in the past. We discussed using a probiotic gel that she can buy over-the-counter and apply twice weekly. A prescription for Diflucan 100 mg by mouth was prescribed today. We will notify her if there is any abnormality in the [facility] and Chlamydia culture. Since she has a strong family history diabetes and states that she gets recurrent yeast infections a hemoglobin A1c was drawn today. Dr. Audie Box will discuss with her further treatment options for her vaginal intraepithelial neoplasia and compare his findings with mine today since she states that she's noted additional lesions which appears clinically to be highly suspicious for condyloma acuminatum as well. Her  urinalysis was negative today and on exam she had no CVA tenderness and it appears to be more lumbosacral strain which she states it's infrequent and it could be attributed to her compensation when she began her physical therapy for her knee surgery.

## 2011-07-30 LAB — GC/CHLAMYDIA PROBE AMP, GENITAL: Chlamydia, DNA Probe: NEGATIVE

## 2011-08-02 ENCOUNTER — Encounter: Payer: Self-pay | Admitting: Gynecology

## 2011-08-02 ENCOUNTER — Ambulatory Visit (INDEPENDENT_AMBULATORY_CARE_PROVIDER_SITE_OTHER): Payer: 59 | Admitting: Gynecology

## 2011-08-02 DIAGNOSIS — A630 Anogenital (venereal) warts: Secondary | ICD-10-CM

## 2011-08-02 DIAGNOSIS — N890 Mild vaginal dysplasia: Secondary | ICD-10-CM

## 2011-08-02 DIAGNOSIS — N893 Dysplasia of vagina, unspecified: Secondary | ICD-10-CM

## 2011-08-02 NOTE — Patient Instructions (Signed)
Sits baths as needed. Return in 2 weeks to 4 weeks for reexamination

## 2011-08-02 NOTE — Progress Notes (Signed)
Patient ID: Gabriella Sawyer, female   DOB: 1980/06/22, 31 y.o.   MRN: [id number] Patient presents for reinspection. She had several papillary areas on her vulva that we biopsied that to showed skin tags and 1 showed VAIN grade 1. She just saw Dr. Lily Peer 07/29/2011 where he diagramed several other areas in his office note.  Exam was [name] chaperone present External with multiple areas of classic condyloma acuminata diagrammed below. Vaginal exam is normal. Cervix normal. Bimanual without masses or tenderness and no palpable internal condyloma.  Physical Exam  Genitourinary:     TCA 50% was applied to all diagrammed areas. Patient tolerated well. Sitz baths following treatment reviewed. Patient will represent in 2-4 weeks for reinspection. I discussed the HPV nature of the lesions in the issues with this.

## 2011-09-01 ENCOUNTER — Encounter: Payer: 59 | Admitting: Gynecology

## 2011-09-02 ENCOUNTER — Encounter: Payer: Self-pay | Admitting: Gynecology

## 2011-09-02 ENCOUNTER — Ambulatory Visit (INDEPENDENT_AMBULATORY_CARE_PROVIDER_SITE_OTHER): Payer: 59 | Admitting: Gynecology

## 2011-09-02 VITALS — BP 116/74 | Ht 62.0 in | Wt 180.0 lb

## 2011-09-02 DIAGNOSIS — A630 Anogenital (venereal) warts: Secondary | ICD-10-CM

## 2011-09-02 DIAGNOSIS — Z01419 Encounter for gynecological examination (general) (routine) without abnormal findings: Secondary | ICD-10-CM

## 2011-09-02 NOTE — Patient Instructions (Signed)
Follow up in 6 months for reexamination. Follow up sooner if condyloma acuminata recur or persist.

## 2011-09-02 NOTE — Progress Notes (Signed)
Patient ID: Gabriella Sawyer, female   DOB: December 07, 1980, 31 y.o.   MRN: [id number] Gabriella Sawyer 1980/04/30 [id number]        31 y.o.  for annual exam.  Several issues noted below.  Past medical history,surgical history, medications, allergies, family history and social history were all reviewed and documented in the EPIC chart. ROS:  Was performed and pertinent positives and negatives are included in the history.  Exam: [name] chaperone present Filed Vitals:   09/02/11 1403  BP: 116/74   General appearance  Normal Skin grossly normal Head/Neck normal with no cervical or supraclavicular adenopathy thyroid normal Lungs  clear Cardiac RR, without RMG Abdominal  soft, nontender, without masses, organomegaly or hernia Breasts  examined lying and sitting without masses, retractions, discharge or axillary adenopathy. Pelvic  Ext/BUS/vagina  3 small classic condyloma left upper perineal region lateral to the posterior fourchette  Cervix  normal IUD string visualized  Uterus  anteverted, normal size, shape and contour, midline and mobile nontender   Adnexa  Without masses or tenderness    Anus and perineum  normal   Rectovaginal  normal sphincter tone without palpated masses or tenderness.  Physical Exam  Genitourinary:        Assessment/Plan:  31 y.o. female for annual exam.    1. 3 small remaining condyloma. TCA 50% applied. Patient will follow up these areas persist or new areas develop. 2. History VAIN 1.  Recent biopsy 4:00 position at the introital opening. Exam today appears normal. Recommended patient follow up in 6 months for reexamination. 3. Pap smear. No Pap smear was done today. Last normal Pap smear 2012. Patient has no history of abnormal Pap smears with numerous normal reports in her chart. I reviewed current screening guidelines we'll plan on every 3-5 year screening. 4. Mirena IUD. Placed November 2012. Patient is doing well amenorrhea. We'll continue to monitor. IUD string  visualized. 5. Breast health. SBE monthly reviewed. 6. Health maintenance. Patient had a normal glucose lipid profile CBC last year. No blood work was done today. Assuming she continues well from a gynecologic standpoint then she will see me in 6 months, sooner as needed.    Dara Lords MD, 2:34 PM 09/02/2011

## 2011-10-29 ENCOUNTER — Other Ambulatory Visit: Payer: Self-pay | Admitting: Specialist

## 2011-11-01 ENCOUNTER — Other Ambulatory Visit: Payer: Self-pay | Admitting: Specialist

## 2011-12-12 ENCOUNTER — Encounter (HOSPITAL_BASED_OUTPATIENT_CLINIC_OR_DEPARTMENT_OTHER): Payer: Self-pay | Admitting: *Deleted

## 2011-12-12 NOTE — Progress Notes (Signed)
NPO AFTER MN WITH EXCEPTION WATER/ GATORADE UNTIL 0700. ARRIVES AT 1100. NEEDS HG. WILL PAGE DR BEANE QUESTION ORDERS.

## 2011-12-15 ENCOUNTER — Other Ambulatory Visit: Payer: Self-pay | Admitting: Specialist

## 2011-12-15 ENCOUNTER — Encounter (HOSPITAL_COMMUNITY): Payer: Self-pay | Admitting: *Deleted

## 2011-12-15 NOTE — Progress Notes (Signed)
Left message at [facility] [place] that no order for consent is placed in EPIC

## 2011-12-15 NOTE — Progress Notes (Signed)
Per Cordelia Pen at [facility], note was sent to Dr Cecile Hearing clinic for consent order to be placed in [facility]

## 2011-12-15 NOTE — Progress Notes (Signed)
Instructed in betasept shower tonight and in the morning, regular soap face and privates, no shaving. States was to have the 3rd hepatitis vaccine today- wants to know if this is ok? - referred to Dr Loanne Drilling at [facility]

## 2011-12-16 ENCOUNTER — Encounter (HOSPITAL_COMMUNITY)
Admission: RE | Disposition: A | Discharge status: Home / Self Care | Payer: Self-pay | Source: Ambulatory Visit | Attending: Specialist

## 2011-12-16 ENCOUNTER — Encounter (HOSPITAL_BASED_OUTPATIENT_CLINIC_OR_DEPARTMENT_OTHER): Admission: RE | Payer: Self-pay | Source: Ambulatory Visit

## 2011-12-16 ENCOUNTER — Encounter (HOSPITAL_COMMUNITY): Payer: Self-pay | Admitting: Anesthesiology

## 2011-12-16 ENCOUNTER — Ambulatory Visit (HOSPITAL_COMMUNITY): Payer: 59 | Admitting: Anesthesiology

## 2011-12-16 ENCOUNTER — Ambulatory Visit (HOSPITAL_COMMUNITY): Payer: 59

## 2011-12-16 ENCOUNTER — Ambulatory Visit (HOSPITAL_COMMUNITY)
Admission: RE | Admit: 2011-12-16 | Discharge: 2011-12-16 | Disposition: A | Discharge status: Home / Self Care | Payer: 59 | Source: Ambulatory Visit | Attending: Specialist | Admitting: Specialist

## 2011-12-16 ENCOUNTER — Encounter (HOSPITAL_COMMUNITY): Payer: Self-pay

## 2011-12-16 ENCOUNTER — Ambulatory Visit (HOSPITAL_BASED_OUTPATIENT_CLINIC_OR_DEPARTMENT_OTHER): Admission: RE | Admit: 2011-12-16 | Payer: 59 | Source: Ambulatory Visit | Admitting: Specialist

## 2011-12-16 DIAGNOSIS — M898X9 Other specified disorders of bone, unspecified site: Secondary | ICD-10-CM | POA: Insufficient documentation

## 2011-12-16 DIAGNOSIS — IMO0002 Reserved for concepts with insufficient information to code with codable children: Secondary | ICD-10-CM | POA: Insufficient documentation

## 2011-12-16 DIAGNOSIS — Z79899 Other long term (current) drug therapy: Secondary | ICD-10-CM | POA: Insufficient documentation

## 2011-12-16 DIAGNOSIS — X58XXXA Exposure to other specified factors, initial encounter: Secondary | ICD-10-CM | POA: Insufficient documentation

## 2011-12-16 DIAGNOSIS — M2240 Chondromalacia patellae, unspecified knee: Secondary | ICD-10-CM | POA: Insufficient documentation

## 2011-12-16 DIAGNOSIS — E282 Polycystic ovarian syndrome: Secondary | ICD-10-CM | POA: Insufficient documentation

## 2011-12-16 HISTORY — PX: KNEE ARTHROSCOPY: SHX127

## 2011-12-16 LAB — CBC
HCT: 36.6 % (ref 36.0–46.0)
Hemoglobin: 12 g/dL (ref 12.0–15.0)
MCH: 29.9 pg (ref 26.0–34.0)
MCHC: 32.8 g/dL (ref 30.0–36.0)
MCV: 91 fL (ref 78.0–100.0)
RDW: 12.5 % (ref 11.5–15.5)

## 2011-12-16 SURGERY — ARTHROSCOPY, KNEE
Anesthesia: General | Laterality: Right

## 2011-12-16 SURGERY — ARTHROSCOPY, KNEE
Anesthesia: General | Site: Knee | Laterality: Right | Wound class: Clean

## 2011-12-16 MED ORDER — PROMETHAZINE HCL 25 MG/ML IJ SOLN: 6.2500 mg | INTRAMUSCULAR | Status: DC | PRN

## 2011-12-16 MED ORDER — DEXAMETHASONE SODIUM PHOSPHATE 10 MG/ML IJ SOLN
INTRAMUSCULAR | Status: DC | PRN
  Administered 2011-12-16: 10 mg via INTRAVENOUS

## 2011-12-16 MED ORDER — PROPOFOL 10 MG/ML IV BOLUS
INTRAVENOUS | Status: DC | PRN
  Administered 2011-12-16: 200 mg via INTRAVENOUS

## 2011-12-16 MED ORDER — FENTANYL CITRATE 0.05 MG/ML IJ SOLN
INTRAMUSCULAR | Status: DC | PRN
  Administered 2011-12-16: 50 ug via INTRAVENOUS
  Administered 2011-12-16: 100 ug via INTRAVENOUS
  Administered 2011-12-16 (×4): 50 ug via INTRAVENOUS

## 2011-12-16 MED ORDER — LIDOCAINE HCL (CARDIAC) 20 MG/ML IV SOLN
INTRAVENOUS | Status: DC | PRN
  Administered 2011-12-16: 50 mg via INTRAVENOUS

## 2011-12-16 MED ORDER — OXYCODONE HCL 5 MG PO TABS
5.0000 mg | ORAL_TABLET | Freq: Once | ORAL | Status: AC | PRN
  Administered 2011-12-16: 5 mg via ORAL
  Filled 2011-12-16: qty 1

## 2011-12-16 MED ORDER — ACETAMINOPHEN 10 MG/ML IV SOLN: 1000.0000 mg | Freq: Once | INTRAVENOUS | Status: DC | PRN

## 2011-12-16 MED ORDER — HYDROMORPHONE HCL PF 1 MG/ML IJ SOLN
INTRAMUSCULAR | Status: AC
  Filled 2011-12-16: qty 1

## 2011-12-16 MED ORDER — TRAMADOL HCL 50 MG PO TABS: 50.0000 mg | ORAL_TABLET | Freq: Four times a day (QID) | ORAL | Status: AC | PRN

## 2011-12-16 MED ORDER — BUPIVACAINE-EPINEPHRINE 0.5% -1:200000 IJ SOLN
INTRAMUSCULAR | Status: DC | PRN
  Administered 2011-12-16: 14 mL

## 2011-12-16 MED ORDER — MUPIROCIN 2 % EX OINT
TOPICAL_OINTMENT | Freq: Two times a day (BID) | CUTANEOUS | Status: DC
  Filled 2011-12-16: qty 22

## 2011-12-16 MED ORDER — PROMETHAZINE HCL 12.5 MG PO TABS: 12.5000 mg | ORAL_TABLET | Freq: Four times a day (QID) | ORAL | Status: DC | PRN

## 2011-12-16 MED ORDER — LACTATED RINGERS IR SOLN
Status: DC | PRN
  Administered 2011-12-16: 6000 mL

## 2011-12-16 MED ORDER — MIDAZOLAM HCL 5 MG/5ML IJ SOLN
INTRAMUSCULAR | Status: DC | PRN
  Administered 2011-12-16: 2 mg via INTRAVENOUS

## 2011-12-16 MED ORDER — SCOPOLAMINE 1 MG/3DAYS TD PT72
MEDICATED_PATCH | TRANSDERMAL | Status: DC | PRN
  Administered 2011-12-16: 1.5 mg via TRANSDERMAL

## 2011-12-16 MED ORDER — SCOPOLAMINE 1 MG/3DAYS TD PT72
MEDICATED_PATCH | TRANSDERMAL | Status: AC
  Filled 2011-12-16: qty 1

## 2011-12-16 MED ORDER — LACTATED RINGERS IV SOLN
INTRAVENOUS | Status: DC
  Administered 2011-12-16: 1000 mL via INTRAVENOUS

## 2011-12-16 MED ORDER — SODIUM CHLORIDE 0.9 % IR SOLN
Status: DC | PRN
  Administered 2011-12-16: 15:00:00

## 2011-12-16 MED ORDER — DROPERIDOL 2.5 MG/ML IJ SOLN
INTRAMUSCULAR | Status: DC | PRN
  Administered 2011-12-16: 0.625 mg via INTRAVENOUS

## 2011-12-16 MED ORDER — ACETAMINOPHEN 10 MG/ML IV SOLN
INTRAVENOUS | Status: AC
  Filled 2011-12-16: qty 100

## 2011-12-16 MED ORDER — KETOROLAC TROMETHAMINE 30 MG/ML IJ SOLN
INTRAMUSCULAR | Status: DC | PRN
  Administered 2011-12-16: 30 mg via INTRAVENOUS

## 2011-12-16 MED ORDER — CEFAZOLIN SODIUM-DEXTROSE 2-3 GM-% IV SOLR
INTRAVENOUS | Status: AC
  Filled 2011-12-16: qty 50

## 2011-12-16 MED ORDER — OXYCODONE HCL 5 MG/5ML PO SOLN
5.0000 mg | Freq: Once | ORAL | Status: AC | PRN
  Filled 2011-12-16: qty 5

## 2011-12-16 MED ORDER — ACETAMINOPHEN 10 MG/ML IV SOLN
INTRAVENOUS | Status: DC | PRN
  Administered 2011-12-16: 1000 mg via INTRAVENOUS

## 2011-12-16 MED ORDER — LACTATED RINGERS IV SOLN
INTRAVENOUS | Status: DC | PRN
  Administered 2011-12-16: 16:00:00

## 2011-12-16 MED ORDER — CEFAZOLIN SODIUM-DEXTROSE 2-3 GM-% IV SOLR
2.0000 g | INTRAVENOUS | Status: AC
  Administered 2011-12-16: 2 g via INTRAVENOUS

## 2011-12-16 MED ORDER — MEPERIDINE HCL 50 MG/ML IJ SOLN
6.2500 mg | INTRAMUSCULAR | Status: DC | PRN
Start: 2011-12-16 — End: 2011-12-16

## 2011-12-16 MED ORDER — LACTATED RINGERS IV SOLN: INTRAVENOUS | Status: DC | PRN

## 2011-12-16 MED ORDER — HYDROMORPHONE HCL PF 1 MG/ML IJ SOLN
0.2500 mg | INTRAMUSCULAR | Status: DC | PRN
  Administered 2011-12-16: 0.5 mg via INTRAVENOUS

## 2011-12-16 MED ORDER — BUPIVACAINE-EPINEPHRINE (PF) 0.5% -1:200000 IJ SOLN
INTRAMUSCULAR | Status: AC
  Filled 2011-12-16: qty 10

## 2011-12-16 MED ORDER — CEPHALEXIN 500 MG PO CAPS: 500.0000 mg | ORAL_CAPSULE | Freq: Four times a day (QID) | ORAL | Status: AC

## 2011-12-16 MED ORDER — ONDANSETRON HCL 4 MG/2ML IJ SOLN
INTRAMUSCULAR | Status: DC | PRN
  Administered 2011-12-16: 4 mg via INTRAVENOUS

## 2011-12-16 SURGICAL SUPPLY — 35 items
BANDAGE ELASTIC 6 VELCRO ST LF (GAUZE/BANDAGES/DRESSINGS) ×1 IMPLANT
BLADE 4.2CUDA (BLADE) IMPLANT
BLADE CUDA SHAVER 3.5 (BLADE) ×2 IMPLANT
CLOTH BEACON ORANGE TIMEOUT ST (SAFETY) ×2 IMPLANT
CONT SPEC 4OZ CLIKSEAL STRL BL (MISCELLANEOUS) ×1 IMPLANT
COVER MAYO STAND STRL (DRAPES) ×1 IMPLANT
DRAPE LG THREE QUARTER DISP (DRAPES) ×1 IMPLANT
DRAPE ORTHO SPLIT 77X108 STRL (DRAPES) ×2
DRAPE STERI 35X30 U-POUCH (DRAPES) ×1 IMPLANT
DRAPE SURG ORHT 6 SPLT 77X108 (DRAPES) IMPLANT
DRSG ADAPTIC 3X8 NADH LF (GAUZE/BANDAGES/DRESSINGS) ×1 IMPLANT
DRSG EMULSION OIL 3X3 NADH (GAUZE/BANDAGES/DRESSINGS) ×2 IMPLANT
DURAPREP 26ML APPLICATOR (WOUND CARE) ×2 IMPLANT
ELECT REM PT RETURN 9FT ADLT (ELECTROSURGICAL) ×2
ELECTRODE REM PT RTRN 9FT ADLT (ELECTROSURGICAL) IMPLANT
GLOVE BIOGEL PI IND STRL 8 (GLOVE) ×1 IMPLANT
GLOVE BIOGEL PI INDICATOR 8 (GLOVE) ×3
GLOVE SURG SS PI 8.0 STRL IVOR (GLOVE) ×4 IMPLANT
GOWN PREVENTION PLUS LG XLONG (DISPOSABLE) ×3 IMPLANT
GOWN STRL REIN XL XLG (GOWN DISPOSABLE) ×3 IMPLANT
MANIFOLD NEPTUNE II (INSTRUMENTS) ×4 IMPLANT
PACK ARTHROSCOPY WL (CUSTOM PROCEDURE TRAY) ×2 IMPLANT
PADDING CAST COTTON 6X4 STRL (CAST SUPPLIES) ×2 IMPLANT
PENCIL BUTTON HOLSTER BLD 10FT (ELECTRODE) ×1 IMPLANT
SET ARTHROSCOPY TUBING (MISCELLANEOUS) ×4
SET ARTHROSCOPY TUBING LN (MISCELLANEOUS) ×1 IMPLANT
SPONGE GAUZE 4X4 12PLY (GAUZE/BANDAGES/DRESSINGS) ×1 IMPLANT
SPONGE LAP 18X18 X RAY DECT (DISPOSABLE) ×1 IMPLANT
SUCTION FRAZIER 12FR DISP (SUCTIONS) ×1 IMPLANT
SUCTION FRAZIER TIP 10 FR DISP (SUCTIONS) ×1 IMPLANT
SUT ETHILON 4 0 PS 2 18 (SUTURE) ×2 IMPLANT
SUT PROLENE 4 0 PS 2 18 (SUTURE) ×1 IMPLANT
TUBING CONNECTING 10 (TUBING) ×2 IMPLANT
WRAP KNEE MAXI GEL POST OP (GAUZE/BANDAGES/DRESSINGS) ×2 IMPLANT
YANKAUER SUCT BULB TIP NO VENT (SUCTIONS) ×1 IMPLANT

## 2011-12-16 NOTE — Anesthesia Preprocedure Evaluation (Addendum)
Anesthesia Evaluation  Patient identified by MRN, date of birth, ID band Patient awake    Reviewed: Allergy & Precautions, H&P , NPO status , Patient's Chart, lab work & pertinent test results  History of Anesthesia Complications (+) PONV  Airway Mallampati: I TM Distance: >3 FB Neck ROM: Full    Dental  (+) Teeth Intact and Dental Advisory Given   Pulmonary neg pulmonary ROS,  breath sounds clear to auscultation  Pulmonary exam normal       Cardiovascular negative cardio ROS  Rhythm:Regular Rate:Normal     Neuro/Psych negative neurological ROS     GI/Hepatic negative GI ROS, Neg liver ROS,   Endo/Other  PCOS  Renal/GU negative Renal ROS     Musculoskeletal negative musculoskeletal ROS (+)   Abdominal   Peds  Hematology negative hematology ROS (+)   Anesthesia Other Findings   Reproductive/Obstetrics                          Anesthesia Physical Anesthesia Plan  ASA: II  Anesthesia Plan: General   Post-op Pain Management:    Induction: Intravenous  Airway Management Planned: LMA  Additional Equipment:   Intra-op Plan:   Post-operative Plan: Extubation in OR  Informed Consent: I have reviewed the patients History and Physical, chart, labs and discussed the procedure including the risks, benefits and alternatives for the proposed anesthesia with the patient or authorized representative who has indicated his/her understanding and acceptance.   Dental advisory given  Plan Discussed with: CRNA and Surgeon  Anesthesia Plan Comments:         Anesthesia Quick Evaluation

## 2011-12-16 NOTE — Brief Op Note (Signed)
12/16/2011  3:46 PM  PATIENT:  Gabriella Sawyer  31 y.o. female  PRE-OPERATIVE DIAGNOSIS:  exostosis and meniscal tear right knee  POST-OPERATIVE DIAGNOSIS:  exostosis and meniscal tear right knee  PROCEDURE:  Procedure(s) (LRB) with comments: ARTHROSCOPY KNEE (Right) - Right Knee Arthroscopy with Excision of Tibial Exostosis, debridement right knee medial menisectomy  SURGEON:  Surgeon(s) and Role:    * Javier Docker, MD - Primary  PHYSICIAN ASSISTANT:   ASSISTANTS: none   ANESTHESIA:   general  EBL:     BLOOD ADMINISTERED:none  DRAINS: none   LOCAL MEDICATIONS USED:  MARCAINE     SPECIMEN:  Source of Specimen:  knee  DISPOSITION OF SPECIMEN:  PATHOLOGY  COUNTS:  YES  TOURNIQUET:  * No tourniquets in log *  DICTATION: .Other Dictation: Dictation Number [id number]  PLAN OF CARE: Discharge to home after PACU  PATIENT DISPOSITION:  PACU - hemodynamically stable.   Delay start of Pharmacological VTE agent (>24hrs) due to surgical blood loss or risk of bleeding: no

## 2011-12-16 NOTE — Transfer of Care (Signed)
Immediate Anesthesia Transfer of Care Note  Patient: Gabriella Sawyer  Procedure(s) Performed: Procedure(s) (LRB) with comments: ARTHROSCOPY KNEE (Right) - Right Knee Arthroscopy with Excision of Tibial Exostosis, debridement right knee medial menisectomy  Patient Location: PACU  Anesthesia Type: General  Level of Consciousness: awake, oriented and patient cooperative  Airway & Oxygen Therapy: Patient Spontanous Breathing and Patient connected to face mask oxygen  Post-op Assessment: Report given to PACU RN and Post -op Vital signs reviewed and stable  Post vital signs: Reviewed and stable  Complications: No apparent anesthesia complications

## 2011-12-16 NOTE — Anesthesia Procedure Notes (Signed)
Procedures

## 2011-12-16 NOTE — H&P (Addendum)
Gabriella Sawyer is an 31 y.o. female.   Chief Complaint: right knee pain HPI: Painful exostosis right and medial meniscus.  Past Medical History  Diagnosis Date  . PCOS (polycystic ovarian syndrome)   . PONV (postoperative nausea and vomiting)   . VAIN I (vaginal intraepithelial neoplasia grade I) 07/2011    at hymenal ring 4 oclock    Past Surgical History  Procedure Date  . Foot surgery 2000    left foot, screw inserted  . Wisdom tooth extraction 2001  . Intrauterine device insertion 03-02-2011 &  06/2005     DR FONTAINE  . Cesarean section 12-03-2001  &  03-24-2003    LAST ONE W/ TUBAL LIGATION  . Roux-en-y gastric bypass 01-22-2008    LAPAROSCOPIC  . Tonsillectomy and adenoidectomy 1995  . Laparoscopic cholecystectomy 12-22-2008  . Knee arthroscopy w/ meniscectomy 07-08-2011  DR [facility]    RIGHT KNEE    Family History  Problem Relation Age of Onset  . Diabetes Father   . Hypertension Father   . Cancer Maternal Grandmother     COLON CANCER  . Diabetes Paternal Grandfather    Social History:  reports that she has never smoked. She has never used smokeless tobacco. She reports that she does not drink alcohol or use illicit drugs.  Allergies:  Allergies  Allergen Reactions  . Codeine Nausea And Vomiting  . Shrimp (Shellfish Allergy) Anaphylaxis    Medications Prior to Admission  Medication Sig Dispense Refill  . Calcium Carbonate-Vitamin D (CALCIUM + D PO) Take 2 tablets by mouth daily.       . Cyanocobalamin (B-12 PO) Place 1 tablet under the tongue daily.       Marland Kitchen EPINEPHrine (EPI-PEN) 0.3 mg/0.3 mL DEVI Inject 0.3 mg into the muscle once as needed. reaction      . Multiple Vitamin (MULTIVITAMIN PO) Take 2 tablets by mouth daily.         Results for orders placed during the hospital encounter of 12/16/11 (from the past 48 hour(s))  SURGICAL PCR SCREEN     Status: Normal   Collection Time   12/16/11 10:30 AM      Component Value Range Comment   MRSA, PCR NEGATIVE   NEGATIVE    Staphylococcus aureus NEGATIVE  NEGATIVE   CBC     Status: Normal   Collection Time   12/16/11 11:10 AM      Component Value Range Comment   WBC 5.6  4.0 - 10.5 K/uL    RBC 4.02  3.87 - 5.11 MIL/uL    Hemoglobin 12.0  12.0 - 15.0 g/dL    HCT 16.1  09.6 - 04.5 %    MCV 91.0  78.0 - 100.0 fL    MCH 29.9  26.0 - 34.0 pg    MCHC 32.8  30.0 - 36.0 g/dL    RDW 40.9  81.1 - 91.4 %    Platelets 262  150 - 400 K/uL   HCG, SERUM, QUALITATIVE     Status: Normal   Collection Time   12/16/11 11:10 AM      Component Value Range Comment   Preg, Serum NEGATIVE  NEGATIVE    No results found.  Review of Systems  Musculoskeletal: Positive for joint pain.  All other systems reviewed and are negative.    Blood pressure 106/73, pulse 78, temperature 97.6 F (36.4 C), temperature source Oral, resp. rate 18, height 5\' 2"  (1.575 m), weight 81.307 kg (179 lb 4 oz),  SpO2 100.00%. Physical Exam  Vitals reviewed. Constitutional: She is oriented to person, place, and time. She appears well-developed.  HENT:  Head: Normocephalic and atraumatic.  Eyes: Pupils are equal, round, and reactive to light.  Neck: Normal range of motion.  Cardiovascular: Normal rate.   Respiratory: Effort normal.  GI: Soft.  Musculoskeletal:       Tender over exostosis. Tender MJL. Patellofemoral pain.  Neurological: She is alert and oriented to person, place, and time.  Skin: Skin is warm and dry.  Psychiatric: She has a normal mood and affect.     Assessment/Plan Painful exostosis right knee and possible meniscus retear. Plan excision and knee   arthroscopy. Risks discussed.  Marilyn Nihiser C 12/16/2011, 1:25 PM

## 2011-12-16 NOTE — Anesthesia Postprocedure Evaluation (Signed)
Anesthesia Post Note  Patient: Gabriella Sawyer  Procedure(s) Performed: Procedure(s) (LRB): ARTHROSCOPY KNEE (Right)  Anesthesia type: General  Patient location: PACU  Post pain: Pain level controlled  Post assessment: Post-op Vital signs reviewed  Last Vitals: BP 123/72  Pulse 83  Temp 36.8 C (Oral)  Resp 15  Ht 5\' 2"  (1.575 m)  Wt 179 lb 4 oz (81.307 kg)  BMI 32.79 kg/m2  SpO2 97%  Post vital signs: Reviewed  Level of consciousness: sedated  Complications: No apparent anesthesia complications

## 2011-12-17 NOTE — Op Note (Signed)
Gabriella Sawyer, Gabriella Sawyer NO.:  [id number]  MEDICAL RECORD NO.:  [id number]  LOCATION:  [facility]                         FACILITY:  [facility]  PHYSICIAN:  Jene Every, M.D.    DATE OF BIRTH:  September 16, 1980  DATE OF PROCEDURE:  12/16/2011 DATE OF DISCHARGE:  12/16/2011                              OPERATIVE REPORT   PREOPERATIVE DIAGNOSES: 1. Painful exostosis of the proximal tibia on the right. 2. Recurrent medial meniscus tear, chondromalacia patella.  PROCEDURES PERFORMED: 1. Open excision of painful exostosis of the proximal tibia with     resection of the pes anserinus bursa. 2. Right shoulder arthroscopy, shaving of the anterior horn of the     medial meniscus, chondroplasty of patellofemoral joint.  ASSISTANT:  No assistant.  HISTORY:  This 31 year old with a history of arthroscopy in the past with lateral retinacular release.  She has had recurrent knee pain in the medial side and a particularly tender exostosis, that may have fractured consistent with the MRI. She had the pes anserinus tendon which was extended over the exostosis, refractory, rest, activity modification, corticosteroid injection, fairly prominent on palpation as well as on her radiographs and MRI.  She was indicated for excision of exostosis, then arthroscopy evaluation of meniscus and the patellofemoral joint.  Risks and benefits were discussed including bleeding, infection, no change in symptoms, worsening symptoms, damage to neurovascular structures, DVT, PE anesthetic complications, etc.  TECHNIQUE:  With the patient in supine position, after induction of adequate general anesthesia and 2 g Kefzol, the right lower extremity was prepped and draped in usual sterile fashion.  A midline incision was then made just medial to the tibial tubercle from the joint space distal approximately 4 cm.  Subcutaneous tissue was dissected by electrocautery to achieve hemostasis.  A retinacular attachment to  the patellar ligament was divided down to the proximal tibia, subperiosteally elevated the soft tissues medially keeping on the bone of the proximal tibia.  Dissection was carried to the pes anserinus tendon expansion and there was a tight and prominent region over the exostosis.  I skeletonized the exostosis by partially dividing the pes anserinus tendon transversely, then utilizing curved Crego and a Cobb elevator to elevate the soft tissues and protected behind the exostosis, which was medial and slightly posterior.  With a curved Crego protecting the posterior and the superior and inferior portion of the exostosis, we used an osteotome to divide it along its base.  This was then removed piecemeal with a pituitary and a hemostat.  Some of the bone was sent to pathology.  There was no cartilaginous end cap noted on that.  Following this, we digitally palpated the posterior in cephalad and caudad with residual bony prominence noted.  The wound was copiously irrigated with antibiotic irrigation and took an intraoperative radiograph, which showed apparent removal of the exostosis in its entirety.  There is certainly no tension on the pes anserinus tendon expansion.  Then, the dissection was subperiosteal after copious antibiotic irrigation, tied loosely the remaining portion of the pes anserinus, the medial third of the pes anserinus tendon and then repaired the capsule in extension into the patellar ligament with #  1 Vicryl interrupted figure-of-eight sutures.  Subcu with 2-0 Vicryl simple sutures and skin was reapproximated 4-0 subcuticular Prolene.  Next, the knee flexed to 90 degrees.  A lateral parapatellar portal was fashioned with a #11 blade. Ingress cannula atraumatically placed.  Irrigant was utilized to insufflate the joint, to 65 mmHg.  Lateral compartment in visualization revealed normal femoral condyle, tibial plateau, and meniscus stable to probe palpation.  Suprapatellar  pouch revealed a previous patellar release.  Chondral fraying of light chondroplasty of patella performed.  There was extensive grade 3 changes of the patella and of the lateral sulcus of the femur with flexion and extension appeared to be tracking appropriately and was able to evert the patella.  The sulcus was unremarkable.  Gutters were unremarkable.  Reexamined the medial compartment and she had some re-fraying and small tearing of the anterior horn in the 3rd of the meniscus, which was stable to probe palpation, introduced a shaver and shaved to a stable base.  Remained in the stable probe palpation.  No chondral defects from condyle and tibial plateau.  The ACL was unremarkable.  Remainder of the chondral surfaces were unremarkable.  We removed all compartments.  No further pathology amenable to arthroscopic intervention and, therefore, removed all instrumentation.  Portals were closed with 4-0 nylon simple sutures, 0.25% Marcaine with epinephrine was infiltrated in joint. Wound was dressed sterilely without difficulty, and transported to the recovery room in satisfactory condition.  The patient tolerated the procedure well.  No complications.  Minimal blood loss.  SPECIMEN TO PATHOLOGY:  Bone from the exostosis.     Jene Every, M.D.     Cordelia Pen  D:  12/16/2011  T:  12/17/2011  Job:  [id number]

## 2011-12-19 ENCOUNTER — Other Ambulatory Visit: Payer: Self-pay | Admitting: Specialist

## 2011-12-19 ENCOUNTER — Encounter (HOSPITAL_COMMUNITY): Payer: Self-pay | Admitting: Specialist

## 2012-02-09 ENCOUNTER — Ambulatory Visit: Payer: 59 | Admitting: Gynecology

## 2012-02-17 ENCOUNTER — Ambulatory Visit (HOSPITAL_COMMUNITY)
Admission: RE | Admit: 2012-02-17 | Discharge: 2012-02-17 | Disposition: A | Discharge status: Home / Self Care | Payer: 59 | Source: Ambulatory Visit | Attending: Specialist | Admitting: Specialist

## 2012-02-17 ENCOUNTER — Other Ambulatory Visit (HOSPITAL_COMMUNITY): Payer: Self-pay | Admitting: *Deleted

## 2012-02-17 DIAGNOSIS — R609 Edema, unspecified: Secondary | ICD-10-CM

## 2012-02-17 DIAGNOSIS — M79609 Pain in unspecified limb: Secondary | ICD-10-CM

## 2012-02-17 DIAGNOSIS — R52 Pain, unspecified: Secondary | ICD-10-CM

## 2012-02-17 DIAGNOSIS — M7989 Other specified soft tissue disorders: Secondary | ICD-10-CM

## 2012-02-17 NOTE — Progress Notes (Signed)
VASCULAR LAB PRELIMINARY FINDINGS.  Right lower extremity venous duplex has been completed.   Right:  No evidence of DVT, superficial thrombosis, or Baker's cyst.   Farrel Demark, RDMS, RVT 02/17/2012 10:05 AM

## 2012-03-19 ENCOUNTER — Encounter (HOSPITAL_COMMUNITY): Payer: Self-pay | Admitting: *Deleted

## 2012-03-19 NOTE — Progress Notes (Signed)
Dr. Thomasena Edis: We need orders put in EPIC for Gabriella Sawyer ( surg 03/21/12) please Pt is to be Same Day 03/22/12 Thank you

## 2012-03-20 ENCOUNTER — Encounter (HOSPITAL_COMMUNITY): Payer: Self-pay | Admitting: Pharmacy Technician

## 2012-03-21 ENCOUNTER — Ambulatory Visit (HOSPITAL_COMMUNITY)
Admission: RE | Admit: 2012-03-21 | Discharge: 2012-03-22 | Disposition: A | Discharge status: Home / Self Care | Payer: 59 | Source: Ambulatory Visit | Attending: Specialist | Admitting: Specialist

## 2012-03-21 ENCOUNTER — Encounter (HOSPITAL_COMMUNITY): Payer: Self-pay | Admitting: *Deleted

## 2012-03-21 ENCOUNTER — Encounter (HOSPITAL_COMMUNITY): Payer: Self-pay | Admitting: Anesthesiology

## 2012-03-21 ENCOUNTER — Ambulatory Visit (HOSPITAL_COMMUNITY): Payer: 59 | Admitting: Anesthesiology

## 2012-03-21 ENCOUNTER — Other Ambulatory Visit: Payer: Self-pay | Admitting: Pain Medicine

## 2012-03-21 ENCOUNTER — Encounter (HOSPITAL_COMMUNITY)
Admission: RE | Disposition: A | Discharge status: Home / Self Care | Payer: Self-pay | Source: Ambulatory Visit | Attending: Specialist

## 2012-03-21 DIAGNOSIS — E282 Polycystic ovarian syndrome: Secondary | ICD-10-CM | POA: Insufficient documentation

## 2012-03-21 DIAGNOSIS — M238X9 Other internal derangements of unspecified knee: Secondary | ICD-10-CM | POA: Insufficient documentation

## 2012-03-21 DIAGNOSIS — Z79899 Other long term (current) drug therapy: Secondary | ICD-10-CM | POA: Insufficient documentation

## 2012-03-21 DIAGNOSIS — Z9889 Other specified postprocedural states: Secondary | ICD-10-CM

## 2012-03-21 DIAGNOSIS — M2240 Chondromalacia patellae, unspecified knee: Secondary | ICD-10-CM | POA: Insufficient documentation

## 2012-03-21 DIAGNOSIS — Z5333 Arthroscopic surgical procedure converted to open procedure: Secondary | ICD-10-CM | POA: Insufficient documentation

## 2012-03-21 HISTORY — PX: MEDIAL COLLATERAL LIGAMENT REPAIR, KNEE: SHX2019

## 2012-03-21 HISTORY — DX: Unspecified osteoarthritis, unspecified site: M19.90

## 2012-03-21 HISTORY — PX: KNEE ARTHROSCOPY: SHX127

## 2012-03-21 LAB — CBC
HCT: 36.4 % (ref 36.0–46.0)
Hemoglobin: 12.2 g/dL (ref 12.0–15.0)
MCH: 30.4 pg (ref 26.0–34.0)
MCHC: 33.5 g/dL (ref 30.0–36.0)
MCV: 90.8 fL (ref 78.0–100.0)
RDW: 12 % (ref 11.5–15.5)

## 2012-03-21 LAB — SURGICAL PCR SCREEN: Staphylococcus aureus: NEGATIVE

## 2012-03-21 SURGERY — ARTHROSCOPY, KNEE
Anesthesia: General | Site: Knee | Laterality: Right | Wound class: Clean

## 2012-03-21 MED ORDER — ACETAMINOPHEN 10 MG/ML IV SOLN
INTRAVENOUS | Status: DC | PRN
  Administered 2012-03-21: 1000 mg via INTRAVENOUS

## 2012-03-21 MED ORDER — LACTATED RINGERS IR SOLN
Status: DC | PRN
  Administered 2012-03-21: 6000 mL

## 2012-03-21 MED ORDER — MIDAZOLAM HCL 5 MG/5ML IJ SOLN
INTRAMUSCULAR | Status: DC | PRN
  Administered 2012-03-21: 2 mg via INTRAVENOUS

## 2012-03-21 MED ORDER — DEXAMETHASONE SODIUM PHOSPHATE 10 MG/ML IJ SOLN
INTRAMUSCULAR | Status: DC | PRN
  Administered 2012-03-21: 10 mg via INTRAVENOUS

## 2012-03-21 MED ORDER — HYDROMORPHONE HCL PF 1 MG/ML IJ SOLN: 0.2500 mg | INTRAMUSCULAR | Status: DC | PRN

## 2012-03-21 MED ORDER — ENOXAPARIN SODIUM 30 MG/0.3ML ~~LOC~~ SOLN
30.0000 mg | Freq: Two times a day (BID) | SUBCUTANEOUS | Status: DC
  Filled 2012-03-21 (×3): qty 0.3

## 2012-03-21 MED ORDER — PHENYLEPHRINE HCL 10 MG/ML IJ SOLN
INTRAMUSCULAR | Status: DC | PRN
  Administered 2012-03-21: 40 ug via INTRAVENOUS

## 2012-03-21 MED ORDER — SODIUM CHLORIDE 0.9 % IV SOLN: INTRAVENOUS | Status: DC

## 2012-03-21 MED ORDER — FENTANYL CITRATE 0.05 MG/ML IJ SOLN
INTRAMUSCULAR | Status: AC
  Filled 2012-03-21: qty 2

## 2012-03-21 MED ORDER — ROPIVACAINE HCL 5 MG/ML IJ SOLN
INTRAMUSCULAR | Status: DC | PRN
  Administered 2012-03-21: 30 mL

## 2012-03-21 MED ORDER — BUPIVACAINE HCL (PF) 0.25 % IJ SOLN
INTRAMUSCULAR | Status: AC
  Filled 2012-03-21: qty 30

## 2012-03-21 MED ORDER — MORPHINE SULFATE 4 MG/ML IJ SOLN
INTRAMUSCULAR | Status: DC | PRN
  Administered 2012-03-21: 4 mg

## 2012-03-21 MED ORDER — ROPIVACAINE HCL 5 MG/ML IJ SOLN
INTRAMUSCULAR | Status: AC
  Filled 2012-03-21: qty 30

## 2012-03-21 MED ORDER — CEFAZOLIN SODIUM-DEXTROSE 2-3 GM-% IV SOLR
INTRAVENOUS | Status: AC
  Filled 2012-03-21: qty 50

## 2012-03-21 MED ORDER — ACETAMINOPHEN 10 MG/ML IV SOLN
1000.0000 mg | Freq: Four times a day (QID) | INTRAVENOUS | Status: DC
  Administered 2012-03-22 (×3): 1000 mg via INTRAVENOUS
  Filled 2012-03-21 (×5): qty 100

## 2012-03-21 MED ORDER — ONDANSETRON HCL 4 MG/2ML IJ SOLN
INTRAMUSCULAR | Status: DC | PRN
  Administered 2012-03-21: 4 mg via INTRAVENOUS

## 2012-03-21 MED ORDER — BISACODYL 10 MG RE SUPP: 10.0000 mg | Freq: Every day | RECTAL | Status: DC | PRN

## 2012-03-21 MED ORDER — LACTATED RINGERS IV SOLN
INTRAVENOUS | Status: DC
  Administered 2012-03-21: 1000 mL via INTRAVENOUS
  Administered 2012-03-21: 17:00:00 via INTRAVENOUS

## 2012-03-21 MED ORDER — PROPOFOL 10 MG/ML IV BOLUS
INTRAVENOUS | Status: DC | PRN
  Administered 2012-03-21: 150 mg via INTRAVENOUS

## 2012-03-21 MED ORDER — ROCURONIUM BROMIDE 100 MG/10ML IV SOLN
INTRAVENOUS | Status: DC | PRN
  Administered 2012-03-21: 40 mg via INTRAVENOUS

## 2012-03-21 MED ORDER — MORPHINE SULFATE 4 MG/ML IJ SOLN
INTRAMUSCULAR | Status: AC
  Filled 2012-03-21: qty 1

## 2012-03-21 MED ORDER — OXYCODONE HCL 5 MG PO TABS
5.0000 mg | ORAL_TABLET | ORAL | Status: DC | PRN
  Administered 2012-03-22 (×2): 5 mg via ORAL
  Filled 2012-03-21 (×2): qty 1

## 2012-03-21 MED ORDER — NEOSTIGMINE METHYLSULFATE 1 MG/ML IJ SOLN
INTRAMUSCULAR | Status: DC | PRN
  Administered 2012-03-21: 2 mg via INTRAVENOUS

## 2012-03-21 MED ORDER — MIDAZOLAM HCL 2 MG/2ML IJ SOLN
INTRAMUSCULAR | Status: AC
  Filled 2012-03-21: qty 2

## 2012-03-21 MED ORDER — MEPERIDINE HCL 50 MG/ML IJ SOLN: 6.2500 mg | INTRAMUSCULAR | Status: DC | PRN

## 2012-03-21 MED ORDER — MUPIROCIN 2 % EX OINT
TOPICAL_OINTMENT | CUTANEOUS | Status: AC
  Filled 2012-03-21: qty 22

## 2012-03-21 MED ORDER — LIDOCAINE HCL (CARDIAC) 20 MG/ML IV SOLN
INTRAVENOUS | Status: DC | PRN
  Administered 2012-03-21: 100 mg via INTRAVENOUS

## 2012-03-21 MED ORDER — DEXTROSE 5 % IV SOLN: 3.0000 g | INTRAVENOUS | Status: DC

## 2012-03-21 MED ORDER — ZOLPIDEM TARTRATE 5 MG PO TABS: 5.0000 mg | ORAL_TABLET | Freq: Every evening | ORAL | Status: DC | PRN

## 2012-03-21 MED ORDER — ACETAMINOPHEN 10 MG/ML IV SOLN
INTRAVENOUS | Status: AC
  Filled 2012-03-21: qty 100

## 2012-03-21 MED ORDER — CEFAZOLIN SODIUM-DEXTROSE 2-3 GM-% IV SOLR
2.0000 g | INTRAVENOUS | Status: AC
  Administered 2012-03-21: 2 g via INTRAVENOUS

## 2012-03-21 MED ORDER — ONDANSETRON HCL 4 MG/2ML IJ SOLN: 4.0000 mg | Freq: Four times a day (QID) | INTRAMUSCULAR | Status: DC | PRN

## 2012-03-21 MED ORDER — METHOCARBAMOL 500 MG PO TABS: 500.0000 mg | ORAL_TABLET | Freq: Four times a day (QID) | ORAL | Status: DC | PRN

## 2012-03-21 MED ORDER — METHOCARBAMOL 100 MG/ML IJ SOLN
500.0000 mg | Freq: Four times a day (QID) | INTRAVENOUS | Status: DC | PRN
  Filled 2012-03-21: qty 5

## 2012-03-21 MED ORDER — MUPIROCIN 2 % EX OINT: TOPICAL_OINTMENT | Freq: Two times a day (BID) | CUTANEOUS | Status: DC

## 2012-03-21 MED ORDER — PROMETHAZINE HCL 25 MG/ML IJ SOLN: 6.2500 mg | INTRAMUSCULAR | Status: DC | PRN

## 2012-03-21 MED ORDER — DOCUSATE SODIUM 100 MG PO CAPS
100.0000 mg | ORAL_CAPSULE | Freq: Two times a day (BID) | ORAL | Status: DC
  Administered 2012-03-21 – 2012-03-22 (×2): 100 mg via ORAL

## 2012-03-21 MED ORDER — SODIUM CHLORIDE 0.9 % IV SOLN
INTRAVENOUS | Status: DC
  Administered 2012-03-21: 21:00:00 via INTRAVENOUS

## 2012-03-21 MED ORDER — MIDAZOLAM HCL 2 MG/2ML IJ SOLN
1.0000 mg | INTRAMUSCULAR | Status: DC | PRN
  Administered 2012-03-21: 2 mg via INTRAVENOUS

## 2012-03-21 MED ORDER — SCOPOLAMINE 1 MG/3DAYS TD PT72
MEDICATED_PATCH | TRANSDERMAL | Status: AC
  Filled 2012-03-21: qty 1

## 2012-03-21 MED ORDER — FENTANYL CITRATE 0.05 MG/ML IJ SOLN
50.0000 ug | Freq: Once | INTRAMUSCULAR | Status: AC
  Administered 2012-03-21: 100 ug via INTRAVENOUS

## 2012-03-21 MED ORDER — SCOPOLAMINE 1 MG/3DAYS TD PT72
1.0000 | MEDICATED_PATCH | TRANSDERMAL | Status: DC
  Administered 2012-03-21: 1.5 mg via TRANSDERMAL

## 2012-03-21 MED ORDER — GLYCOPYRROLATE 0.2 MG/ML IJ SOLN
INTRAMUSCULAR | Status: DC | PRN
  Administered 2012-03-21: 0.3 mg via INTRAVENOUS

## 2012-03-21 MED ORDER — HYDROMORPHONE HCL PF 1 MG/ML IJ SOLN
0.5000 mg | INTRAMUSCULAR | Status: DC | PRN
  Administered 2012-03-21: 1 mg via INTRAVENOUS
  Filled 2012-03-21: qty 1

## 2012-03-21 MED ORDER — SUFENTANIL CITRATE 50 MCG/ML IV SOLN
INTRAVENOUS | Status: DC | PRN
  Administered 2012-03-21: 5 ug via INTRAVENOUS
  Administered 2012-03-21: 10 ug via INTRAVENOUS
  Administered 2012-03-21 (×6): 5 ug via INTRAVENOUS

## 2012-03-21 MED ORDER — METOCLOPRAMIDE HCL 5 MG/ML IJ SOLN
INTRAMUSCULAR | Status: DC | PRN
  Administered 2012-03-21: 10 mg via INTRAVENOUS

## 2012-03-21 MED ORDER — LACTATED RINGERS IV SOLN: INTRAVENOUS | Status: DC

## 2012-03-21 MED ORDER — POLYETHYLENE GLYCOL 3350 17 G PO PACK: 17.0000 g | PACK | Freq: Every day | ORAL | Status: DC | PRN

## 2012-03-21 MED ORDER — POVIDONE-IODINE 7.5 % EX SOLN: Freq: Once | CUTANEOUS | Status: DC

## 2012-03-21 MED ORDER — ONDANSETRON HCL 4 MG PO TABS: 4.0000 mg | ORAL_TABLET | Freq: Four times a day (QID) | ORAL | Status: DC | PRN

## 2012-03-21 MED ORDER — BUPIVACAINE-EPINEPHRINE 0.25% -1:200000 IJ SOLN
INTRAMUSCULAR | Status: DC | PRN
  Administered 2012-03-21: 9 mL

## 2012-03-21 MED ORDER — DIPHENHYDRAMINE HCL 12.5 MG/5ML PO ELIX: 12.5000 mg | ORAL_SOLUTION | ORAL | Status: DC | PRN

## 2012-03-21 MED ORDER — CEFAZOLIN SODIUM-DEXTROSE 2-3 GM-% IV SOLR
2.0000 g | Freq: Four times a day (QID) | INTRAVENOUS | Status: DC
  Administered 2012-03-21 – 2012-03-22 (×2): 2 g via INTRAVENOUS
  Filled 2012-03-21 (×3): qty 50

## 2012-03-21 SURGICAL SUPPLY — 84 items
ANCH SUT PUSHLCK 19.5X3.5 STRL (Anchor) ×2 IMPLANT
ANCH SUT PUSHLCK MN 8X2.5 (Orthopedic Implant) ×2 IMPLANT
ANCHOR PUSHLOCK PEEK 3.5X19.5 (Anchor) ×2 IMPLANT
ANCHOR SUT PUSHLOCK 2.5 MINI (Orthopedic Implant) ×2 IMPLANT
BANDAGE ESMARK 6X9 LF (GAUZE/BANDAGES/DRESSINGS) ×1 IMPLANT
BANDAGE GAUZE ELAST BULKY 4 IN (GAUZE/BANDAGES/DRESSINGS) ×4 IMPLANT
BLADE 4.2CUDA (BLADE) IMPLANT
BLADE CUDA 5.5 (BLADE) ×1 IMPLANT
BLADE CUDA GRT WHITE 3.5 (BLADE) ×2 IMPLANT
BLADE CUDA SHAVER 3.5 (BLADE) IMPLANT
BLADE SURG 10 STRL SS (BLADE) ×1 IMPLANT
BLADE SURG 15 STRL LF DISP TIS (BLADE) ×1 IMPLANT
BLADE SURG 15 STRL SS (BLADE)
BNDG CMPR 9X6 STRL LF SNTH (GAUZE/BANDAGES/DRESSINGS) ×1
BNDG ESMARK 6X9 LF (GAUZE/BANDAGES/DRESSINGS) ×2
BUR OVAL 6.0 (BURR) IMPLANT
BUR VERTEX HOODED 4.5 (BURR) ×1 IMPLANT
CANISTER SUCT LVC 12 LTR MEDI- (MISCELLANEOUS) ×2 IMPLANT
CANISTER SUCTION 1200CC (MISCELLANEOUS) ×1 IMPLANT
CANISTER SUCTION 2500CC (MISCELLANEOUS) IMPLANT
CLOTH BEACON ORANGE TIMEOUT ST (SAFETY) ×2 IMPLANT
COVER TABLE BACK 60X90 (DRAPES) ×2 IMPLANT
CUFF TOURN SGL QUICK 34 (TOURNIQUET CUFF) ×2
CUFF TRNQT CYL 34X4X40X1 (TOURNIQUET CUFF) ×1 IMPLANT
DECANTER SPIKE VIAL GLASS SM (MISCELLANEOUS) ×1 IMPLANT
DRAPE INCISE IOBAN 66X45 STRL (DRAPES) ×2 IMPLANT
DRAPE LG THREE QUARTER DISP (DRAPES) ×4 IMPLANT
DRAPE OEC MINIVIEW 54X84 (DRAPES) ×1 IMPLANT
DRAPE U-SHAPE 47X51 STRL (DRAPES) ×2 IMPLANT
DRSG PAD ABDOMINAL 8X10 ST (GAUZE/BANDAGES/DRESSINGS) ×3 IMPLANT
DURAPREP 26ML APPLICATOR (WOUND CARE) ×2 IMPLANT
ELECT REM PT RETURN 9FT ADLT (ELECTROSURGICAL) ×2
ELECTRODE REM PT RTRN 9FT ADLT (ELECTROSURGICAL) ×1 IMPLANT
GAUZE XEROFORM 1X8 LF (GAUZE/BANDAGES/DRESSINGS) ×1 IMPLANT
GLOVE BIO SURGEON STRL SZ8 (GLOVE) ×2 IMPLANT
GLOVE ECLIPSE 8.0 STRL XLNG CF (GLOVE) ×2 IMPLANT
GLOVE INDICATOR 8.0 STRL GRN (GLOVE) ×2 IMPLANT
GLOVE ORTHO TXT STRL SZ7.5 (GLOVE) ×4 IMPLANT
GLOVE SURG ORTHO 8.0 STRL STRW (GLOVE) ×2 IMPLANT
GOWN PREVENTION PLUS LG XLONG (DISPOSABLE) ×2 IMPLANT
GOWN STRL REIN XL XLG (GOWN DISPOSABLE) ×4 IMPLANT
GRAFT ACHILLES CALC BNE BLCK (Bone Implant) IMPLANT
GRAFT ACHILLES TENDON (Bone Implant) ×2 IMPLANT
IMMOBILIZER KNEE 22 UNIV (SOFTGOODS) IMPLANT
IV NS IRRIG 3000ML ARTHROMATIC (IV SOLUTION) ×8 IMPLANT
KIT TRANSTIBIAL (DISPOSABLE) ×2 IMPLANT
MANIFOLD NEPTUNE II (INSTRUMENTS) ×2 IMPLANT
MINI VAC (SURGICAL WAND) ×1 IMPLANT
NEEDLE HYPO 22GX1.5 SAFETY (NEEDLE) IMPLANT
PACK ARTHROSCOPY WL (CUSTOM PROCEDURE TRAY) ×3 IMPLANT
PACK BASIN DAY SURGERY FS (CUSTOM PROCEDURE TRAY) ×1 IMPLANT
PACK ICE MAXI GEL EZY WRAP (MISCELLANEOUS) ×1 IMPLANT
PAD MASON LEG HOLDER (PIN) ×1 IMPLANT
PADDING CAST ABS 4INX4YD NS (CAST SUPPLIES) ×1
PADDING CAST ABS COTTON 4X4 ST (CAST SUPPLIES) ×1 IMPLANT
PENCIL BUTTON HOLSTER BLD 10FT (ELECTRODE) ×2 IMPLANT
POSITIONER SURGICAL ARM (MISCELLANEOUS) ×2 IMPLANT
SCREW BIO INTERF 7MMX23MM (Screw) ×1 IMPLANT
SET ARTHROSCOPY TUBING (MISCELLANEOUS) ×2
SET ARTHROSCOPY TUBING LN (MISCELLANEOUS) ×1 IMPLANT
SET PAD KNEE POSITIONER (MISCELLANEOUS) ×1 IMPLANT
SPONGE GAUZE 4X4 12PLY (GAUZE/BANDAGES/DRESSINGS) ×3 IMPLANT
SPONGE LAP 4X18 X RAY DECT (DISPOSABLE) ×4 IMPLANT
STRIP CLOSURE SKIN 1/2X4 (GAUZE/BANDAGES/DRESSINGS) ×2 IMPLANT
SUT 2 FIBERLOOP 20 STRT BLUE (SUTURE)
SUT ETHILON 3 0 PS 1 (SUTURE) ×1 IMPLANT
SUT ETHILON 4 0 PS 2 18 (SUTURE) ×1 IMPLANT
SUT FIBERWIRE #2 38 T-5 BLUE (SUTURE) ×4
SUT FIBERWIRE 2-0 18 17.9 3/8 (SUTURE) ×4
SUT MNCRL AB 3-0 PS2 18 (SUTURE) ×2 IMPLANT
SUT VIC AB 0 CT1 36 (SUTURE) ×5 IMPLANT
SUT VIC AB 0 CT2 27 (SUTURE) ×5 IMPLANT
SUT VIC AB 2-0 CT1 27 (SUTURE) ×4
SUT VIC AB 2-0 CT1 TAPERPNT 27 (SUTURE) ×1 IMPLANT
SUTURE 2 FIBERLOOP 20 STRT BLU (SUTURE) ×2 IMPLANT
SUTURE FIBERWR #2 38 T-5 BLUE (SUTURE) IMPLANT
SUTURE FIBERWR 2-0 18 17.9 3/8 (SUTURE) IMPLANT
SYR CONTROL 10ML LL (SYRINGE) IMPLANT
TOWEL OR 17X24 6PK STRL BLUE (TOWEL DISPOSABLE) ×1 IMPLANT
TOWEL OR 17X26 10 PK STRL BLUE (TOWEL DISPOSABLE) ×4 IMPLANT
WAND 30 DEG SABER W/CORD (SURGICAL WAND) IMPLANT
WAND 90 DEG TURBOVAC W/CORD (SURGICAL WAND) IMPLANT
WATER STERILE IRR 500ML POUR (IV SOLUTION) ×1 IMPLANT
WRAP KNEE MAXI GEL POST OP (GAUZE/BANDAGES/DRESSINGS) ×2 IMPLANT

## 2012-03-21 NOTE — Progress Notes (Signed)
Spoke with Nadine Counts (PA)  No orders, discovered he had put in orders earlier but on the wrong patient. He then put in orders on the correct patient.

## 2012-03-21 NOTE — Anesthesia Procedure Notes (Signed)
Anesthesia Regional Block:  Femoral nerve block  Pre-Anesthetic Checklist: ,, timeout performed, Correct Patient, Correct Site, Correct Laterality, Correct Procedure, Correct Position, site marked, Risks and benefits discussed,  Surgical consent,  Pre-op evaluation,  At surgeon's request and post-op pain management  Laterality: Lower and Right  Prep: chloraprep       Needles:  Injection technique: Single-shot  Needle Type: Stimiplex     Needle Length: 10cm 10 cm Needle Gauge: 20 and 20 G    Additional Needles:  Procedures: ultrasound guided (picture in chart) and nerve stimulator Femoral nerve block Narrative:   Performed by: Personally  Anesthesiologist: Phillips Grout MD  Additional Notes: Patient tolerated the procedure well without complications  Femoral nerve block

## 2012-03-21 NOTE — Anesthesia Preprocedure Evaluation (Signed)
Anesthesia Evaluation  Patient identified by MRN, date of birth, ID band Patient awake    Reviewed: Allergy & Precautions, H&P , NPO status , Patient's Chart, lab work & pertinent test results  History of Anesthesia Complications (+) PONV  Airway Mallampati: I TM Distance: >3 FB Neck ROM: Full    Dental  (+) Teeth Intact and Dental Advisory Given   Pulmonary neg pulmonary ROS,  breath sounds clear to auscultation  Pulmonary exam normal       Cardiovascular negative cardio ROS  Rhythm:Regular Rate:Normal     Neuro/Psych negative neurological ROS     GI/Hepatic negative GI ROS, Neg liver ROS,   Endo/Other  PCOS  Renal/GU negative Renal ROS     Musculoskeletal negative musculoskeletal ROS (+)   Abdominal   Peds  Hematology negative hematology ROS (+)   Anesthesia Other Findings   Reproductive/Obstetrics                           Anesthesia Physical  Anesthesia Plan  ASA: I  Anesthesia Plan: General   Post-op Pain Management:    Induction: Intravenous  Airway Management Planned: LMA  Additional Equipment:   Intra-op Plan:   Post-operative Plan: Extubation in OR  Informed Consent: I have reviewed the patients History and Physical, chart, labs and discussed the procedure including the risks, benefits and alternatives for the proposed anesthesia with the patient or authorized representative who has indicated his/her understanding and acceptance.   Dental advisory given  Plan Discussed with: CRNA and Surgeon  Anesthesia Plan Comments: (FNB)        Anesthesia Quick Evaluation

## 2012-03-21 NOTE — H&P (Signed)
Gabriella Sawyer is an 31 y.o. female.   Chief Complaint: Painful right knee HPI: 31 year old female sent multiple surgical procedure on her right knee previous is here unknown specific injury to her right knee but she continued to have pain. Pain is located about the medial aspect the knee. She is found to have questionable insufficiency of her MCL and possible internal derangement. Patient proceed with Dr. Thomasena Edis for evaluation and treatment for an [facility] reconstruction with allograft and knee arthroscopy.  Past Medical History  Diagnosis Date  . PCOS (polycystic ovarian syndrome)   . PONV (postoperative nausea and vomiting)   . VAIN I (vaginal intraepithelial neoplasia grade I) 07/2011    at hymenal ring 4 oclock  . Arthritis   . Knee problem     rt knee    Past Surgical History  Procedure Date  . Foot surgery 2000    left foot, screw inserted  . Wisdom tooth extraction 2001  . Intrauterine device insertion 03-02-2011 &  06/2005     DR FONTAINE  . Cesarean section 12-03-2001  &  03-24-2003    LAST ONE W/ TUBAL LIGATION  . Roux-en-y gastric bypass 01-22-2008    LAPAROSCOPIC  . Tonsillectomy and adenoidectomy 1995  . Laparoscopic cholecystectomy 12-22-2008  . Knee arthroscopy w/ meniscectomy 07-08-2011  DR [facility]    RIGHT KNEE  . Knee arthroscopy 12/16/2011    Procedure: ARTHROSCOPY KNEE;  Surgeon: Javier Docker, MD;  Location: [facility];  Service: Orthopedics;  Laterality: Right;  Right Knee Arthroscopy with Excision of Tibial Exostosis, debridement right knee medial menisectomy    Family History  Problem Relation Age of Onset  . Diabetes Father   . Hypertension Father   . Cancer Maternal Grandmother     COLON CANCER  . Diabetes Paternal Grandfather    Social History:  reports that she has never smoked. She has never used smokeless tobacco. She reports that she does not drink alcohol or use illicit drugs.  Allergies:  Allergies  Allergen Reactions  . Codeine Nausea And  Vomiting  . Shrimp (Shellfish Allergy) Anaphylaxis    Medications Prior to Admission  Medication Sig Dispense Refill  . calcium citrate-vitamin D (CITRACAL+D) 315-200 MG-UNIT per tablet Take 2 tablets by mouth 2 (two) times daily.      . Cyanocobalamin (B-12 PO) Place 1 tablet under the tongue daily.       Marland Kitchen HYDROcodone-acetaminophen (NORCO/VICODIN) 5-325 MG per tablet Take 1 tablet by mouth every 6 (six) hours as needed. Pain      . Multiple Vitamin (MULTIVITAMIN WITH MINERALS) TABS Take 1 tablet by mouth daily.      . promethazine (PHENERGAN) 25 MG tablet Take 25 mg by mouth every 6 (six) hours as needed. nausea      . EPINEPHrine (EPI-PEN) 0.3 mg/0.3 mL DEVI Inject 0.3 mg into the muscle once as needed. reaction      . levonorgestrel (MIRENA) 20 MCG/24HR IUD 1 each by Intrauterine route once.        Results for orders placed during the hospital encounter of 03/21/12 (from the past 48 hour(s))  SURGICAL PCR SCREEN     Status: Normal   Collection Time   03/21/12  2:06 PM      Component Value Range Comment   MRSA, PCR NEGATIVE  NEGATIVE    Staphylococcus aureus NEGATIVE  NEGATIVE   CBC     Status: Normal   Collection Time   03/21/12  2:30 PM  Component Value Range Comment   WBC 7.7  4.0 - 10.5 K/uL    RBC 4.01  3.87 - 5.11 MIL/uL    Hemoglobin 12.2  12.0 - 15.0 g/dL    HCT 78.2  95.6 - 21.3 %    MCV 90.8  78.0 - 100.0 fL    MCH 30.4  26.0 - 34.0 pg    MCHC 33.5  30.0 - 36.0 g/dL    RDW 08.6  57.8 - 46.9 %    Platelets 270  150 - 400 K/uL   HCG, SERUM, QUALITATIVE     Status: Normal   Collection Time   03/21/12  2:30 PM      Component Value Range Comment   Preg, Serum NEGATIVE  NEGATIVE    No results found.  Review of Systems  All other systems reviewed and are negative.    Blood pressure 100/69, pulse 81, temperature 98.1 F (36.7 C), temperature source Oral, resp. rate 18, height 5\' 2"  (1.575 m), weight 81.364 kg (179 lb 6 oz), SpO2 100.00%. Physical Exam  patient is conscious alert appropriate well-developed female appears to be in no extreme distress in the preop bed. Lungs are clear throughout heart was regular rate and rhythm abdomen positive bowel sounds nontender. Her right lower extremity is neuromotor vascularly intact.  Assessment/Plan Right knee pain with the MCL insufficiency and internal derangement  Plan at her previous.discussions with Dr. Thomasena Edis patient elected proceed with an arthroscopic evaluation the knee possible meniscal debridement. Patient is also proceed with an open MCL reconstruction with Achilles allograft. Pros and cons and discussed with the patient previously but Dr. Thomasena Edis and patient elected proceed  Gabriella Sawyer Gabriella Sawyer 03/21/2012, 4:43 PM   I have seen and examined this patient.  Agree with the note above.  Gabriella Sawyer Gabriella Sawyer 03/21/2012 4:44 PM

## 2012-03-21 NOTE — Progress Notes (Signed)
Dr. Thomasena Edis paged for orders

## 2012-03-21 NOTE — Op Note (Signed)
Preop diagnosis right knee possible torn medial meniscus, medial collateral ligament instability, insufficiency Postoperative diagnosis #1 right knee grade 3 chondromalacia of patella. #2 medial collateral ligament insufficiency, instability. Procedure 1 right knee arthroscopic chondroplasty of patella examination under anesthesia. #2 right knee open medial collateral ligament reconstruction with Achilles tendon allograft Surgeon Valma Cava M.D. Assistant Kingsley Spittle Anesthesia femoral nerve block with general estimated blood loss less than 50 cc Drains none Complications none Disposition to PACU stable  Operative details Patient was counseled in the holding area femoral nerve block had been administered. Taken to the operating room placed in supine position under general anesthesia. Vital shunt he was confirmed operative site but yet time out and examined. Full range of motion. Patella stable. Negative Lachman posterior cruciate ligaments stable posterior lateral corner are stable lateral collateral ligaments stable. Full extension valgus stress minimal instability 30 of flexion 3+ opening with soft endpoint. Elevated prepped with DuraPrep draped into sterile fashion exsanguinated with Esmarch tourniquet inflated to 300 mmHg. Arthroscopic portals were established proximal medial inferomedial inferolateral. Diagnostic arthroscopy revealed normal tracking patella but grade 3 chondromalacia with unstable flaps and a shaver was utilized with chondroplasty. Femoral cochlea unremarkable anterior and posterior cruciate ligaments normal lateral side normal articular cartilage normal lateral meniscus medial lateral gutters unremarkable medial side inspected be very careful with valgus stress marked opening and instability arthroscopically. Medial meniscus was intact anterior horn showed fraying but no tear.  Arthroscopic equipment was removed. Medial incision was made incorporating the old incision  distally and then extending the proximal and medial. Dissection was carried through the skin subcutaneous tissue. The retinacular layer was identified there was a red in the tissue distally just proximal to the past tendon insertion. Dissection was undertaken proximal medial through the retinaculum into zone, layer 2. Medial collateral ligament was found to be disrupted with a large stump distally deep to the pedis tendons but had retracted the scar 5 proximally to the joint line. This was a superficial medial collateral ligament. The origin on the medial femoral condyle, epicondyle region was identified cartilage taken down to the insertion site. The proximal medial tibia was then decorticated with an osteotome and roughened with a curette for bleeding response. Also small holes were made to increase the bleeding response and his cortical region. We chose an 8 to a size appropriate Achilles tendon allograft bone tendon, contoured on the back table with a bone plug was 9 mm x 20 mm. A guidepin was placed on the medial femoral condyle in the anatomic origin at the [facility]. and then a socket was repair with a 9 mm reamer. The bony plug was then placed into the socket held in position with an Arthrex bio composite screw. This gave good fixation on the medial origin of the MCL. With the knee at 30 of flexion varus stress the Achilles tendon graft was then attached at the anatomic insertion site on the tibia making 2 limbs and anterior posteriorly and felt with valgus stress and rotation. Fiber loop suture was incorporating both limbs at the appropriate anatomic site push lock anchors were then drilled and tapped into position for the anterior and posterior limb given excellent fixation. To stabilize his more 2 small push lock anchors and placed on the bone more proximal brother joint line to help hold the graft in the Zide position. This time the knee was found to be stable with valgus stressing and the graft remained  intact. Wounds are copiously irrigated. Several times during  the case at each layer. A distal stump of the medial collateral ligament was sutured in distally for reinforcement. The pedis tendons were then meticulously repaired on top, superficial to the medial collateral ligament and then the retinaculum was also closed proximal with FiberWire suture. Subcutaneous was closed with Vicryl skin with a subcuticular Monocryl suture portals were closed with nylon. Sterile dressing was applied tourniquet was deflated normal circulation in the foot at the end of the case. She was placed into a T. ROM brace at 30 of flexion. Patient received preoperative Ancef 2 g. She was then awakened and taken from the operating room to the PACU in stable condition. No complications or problems.  Help with patient positioning prepping draping graft preparation insertion of graft wound closure application of splints Mr. Oneida Alar PA assistance was necessary.

## 2012-03-21 NOTE — Anesthesia Postprocedure Evaluation (Signed)
  Anesthesia Post-op Note  Patient: Gabriella Sawyer  Procedure(s) Performed: Procedure(s) (LRB): ARTHROSCOPY KNEE (Right) RECONSTRUCTION MEDIAL COLLATERAL LIGAMENT (MCL) (Right)  Patient Location: PACU  Anesthesia Type: GA combined with regional for post-op pain  Level of Consciousness: awake and alert   Airway and Oxygen Therapy: Patient Spontanous Breathing  Post-op Pain: mild  Post-op Assessment: Post-op Vital signs reviewed, Patient's Cardiovascular Status Stable, Respiratory Function Stable, Patent Airway and No signs of Nausea or vomiting  Last Vitals:  Filed Vitals:   03/21/12 1930  BP: 109/60  Pulse: 78  Temp:   Resp: 17    Post-op Vital Signs: stable   Complications: No apparent anesthesia complications

## 2012-03-21 NOTE — Transfer of Care (Signed)
Immediate Anesthesia Transfer of Care Note  Patient: Gabriella Sawyer  Procedure(s) Performed: Procedure(s) (LRB) with comments: ARTHROSCOPY KNEE (Right) - right chondroplasty RECONSTRUCTION MEDIAL COLLATERAL LIGAMENT (MCL) (Right) - achilles tendon allograft  Patient Location: PACU  Anesthesia Type:General  Level of Consciousness: awake and sedated  Airway & Oxygen Therapy: Patient Spontanous Breathing and Patient connected to face mask oxygen  Post-op Assessment: Report given to PACU RN and Post -op Vital signs reviewed and stable  Post vital signs: Reviewed and stable  Complications: No apparent anesthesia complications

## 2012-03-21 NOTE — H&P (Signed)
Gabriella Sawyer is an 31 y.o. female.   Chief Complaint: Painful right knee HPI: 31 year old female sent multiple surgical procedure on her right knee previous is here unknown specific injury to her right knee but she continued to have pain. Pain is located about the medial aspect the knee. She is found to have questionable insufficiency of her MCL and possible internal derangement. Patient proceed with Dr. Thomasena Edis for evaluation and treatment for an [facility] reconstruction with allograft and knee arthroscopy.  Past Medical History  Diagnosis Date  . PCOS (polycystic ovarian syndrome)   . PONV (postoperative nausea and vomiting)   . VAIN I (vaginal intraepithelial neoplasia grade I) 07/2011    at hymenal ring 4 oclock  . Arthritis   . Knee problem     rt knee    Past Surgical History  Procedure Date  . Foot surgery 2000    left foot, screw inserted  . Wisdom tooth extraction 2001  . Intrauterine device insertion 03-02-2011 &  06/2005     DR FONTAINE  . Cesarean section 12-03-2001  &  03-24-2003    LAST ONE W/ TUBAL LIGATION  . Roux-en-y gastric bypass 01-22-2008    LAPAROSCOPIC  . Tonsillectomy and adenoidectomy 1995  . Laparoscopic cholecystectomy 12-22-2008  . Knee arthroscopy w/ meniscectomy 07-08-2011  DR [facility]    RIGHT KNEE  . Knee arthroscopy 12/16/2011    Procedure: ARTHROSCOPY KNEE;  Surgeon: Javier Docker, MD;  Location: [facility]S;  Service: Orthopedics;  Laterality: Right;  Right Knee Arthroscopy with Excision of Tibial Exostosis, debridement right knee medial menisectomy    Family History  Problem Relation Age of Onset  . Diabetes Father   . Hypertension Father   . Cancer Maternal Grandmother     COLON CANCER  . Diabetes Paternal Grandfather    Social History:  reports that she has never smoked. She has never used smokeless tobacco. She reports that she does not drink alcohol or use illicit drugs.  Allergies:  Allergies  Allergen Reactions  . Codeine Nausea And  Vomiting  . Shrimp (Shellfish Allergy) Anaphylaxis    Medications Prior to Admission  Medication Sig Dispense Refill  . calcium citrate-vitamin D (CITRACAL+D) 315-200 MG-UNIT per tablet Take 2 tablets by mouth 2 (two) times daily.      . Cyanocobalamin (B-12 PO) Place 1 tablet under the tongue daily.       Marland Kitchen HYDROcodone-acetaminophen (NORCO/VICODIN) 5-325 MG per tablet Take 1 tablet by mouth every 6 (six) hours as needed. Pain      . Multiple Vitamin (MULTIVITAMIN WITH MINERALS) TABS Take 1 tablet by mouth daily.      . promethazine (PHENERGAN) 25 MG tablet Take 25 mg by mouth every 6 (six) hours as needed. nausea      . EPINEPHrine (EPI-PEN) 0.3 mg/0.3 mL DEVI Inject 0.3 mg into the muscle once as needed. reaction      . levonorgestrel (MIRENA) 20 MCG/24HR IUD 1 each by Intrauterine route once.        Results for orders placed during the hospital encounter of 03/21/12 (from the past 48 hour(s))  CBC     Status: Normal   Collection Time   03/21/12  2:30 PM      Component Value Range Comment   WBC 7.7  4.0 - 10.5 K/uL    RBC 4.01  3.87 - 5.11 MIL/uL    Hemoglobin 12.2  12.0 - 15.0 g/dL    HCT 21.3  08.6 - 57.8 %  MCV 90.8  78.0 - 100.0 fL    MCH 30.4  26.0 - 34.0 pg    MCHC 33.5  30.0 - 36.0 g/dL    RDW 16.1  09.6 - 04.5 %    Platelets 270  150 - 400 K/uL   HCG, SERUM, QUALITATIVE     Status: Normal   Collection Time   03/21/12  2:30 PM      Component Value Range Comment   Preg, Serum NEGATIVE  NEGATIVE    No results found.  Review of Systems  All other systems reviewed and are negative.    Blood pressure 100/69, pulse 81, temperature 98.1 F (36.7 C), temperature source Oral, resp. rate 18, height 5\' 2"  (1.575 m), weight 81.364 kg (179 lb 6 oz), SpO2 100.00%. Physical Exam patient is conscious alert appropriate well-developed female appears to be in no extreme distress in the preop bed. Lungs are clear throughout heart was regular rate and rhythm abdomen positive bowel  sounds nontender. Her right lower extremity is neuromotor vascularly intact.  Assessment/Plan Right knee pain with the MCL insufficiency and internal derangement  Plan at her previous.discussions with Dr. Thomasena Edis patient elected proceed with an arthroscopic evaluation the knee possible meniscal debridement. Patient is also proceed with an open MCL reconstruction with Achilles allograft. Pros and cons and discussed with the patient previously but Dr. Thomasena Edis and patient elected proceed  [name] 03/21/2012, 4:34 PM

## 2012-03-22 ENCOUNTER — Encounter (HOSPITAL_COMMUNITY): Payer: Self-pay | Admitting: Specialist

## 2012-03-22 MED ORDER — METHOCARBAMOL 500 MG PO TABS: 500.0000 mg | ORAL_TABLET | Freq: Four times a day (QID) | ORAL | Status: DC | PRN

## 2012-03-22 MED ORDER — OXYCODONE HCL 5 MG PO TABS: 5.0000 mg | ORAL_TABLET | ORAL | Status: DC | PRN

## 2012-03-22 MED ORDER — DSS 100 MG PO CAPS: 100.0000 mg | ORAL_CAPSULE | Freq: Two times a day (BID) | ORAL | Status: DC

## 2012-03-22 MED ORDER — ENOXAPARIN SODIUM 40 MG/0.4ML ~~LOC~~ SOLN: 40.0000 mg | SUBCUTANEOUS | Status: DC

## 2012-03-22 MED ORDER — ENOXAPARIN SODIUM 40 MG/0.4ML ~~LOC~~ SOLN
40.0000 mg | SUBCUTANEOUS | Status: DC
  Administered 2012-03-22: 40 mg via SUBCUTANEOUS
  Filled 2012-03-22 (×2): qty 0.4

## 2012-03-22 NOTE — Progress Notes (Signed)
Subjective: Patient denies any significant planes just it and a lot of sleep last night pain is well controlled she is a little bit dry and sore throat this morning she is no shortness breath chest pains or nausea or vomiting   Objective: Vital signs in last 24 hours: Temp:  [98.1 F (36.7 C)-99.1 F (37.3 C)] 98.1 F (36.7 C) (12/19 0639) Pulse Rate:  [68-101] 68  (12/19 0639) Resp:  [14-22] 18  (12/19 0639) BP: (93-118)/(49-82) 98/63 mmHg (12/19 0639) SpO2:  [96 %-100 %] 97 % (12/19 0639) Weight:  [81.364 kg (179 lb 6 oz)] 81.364 kg (179 lb 6 oz) (12/18 1407)  Intake/Output from previous day: 12/18 0701 - 12/19 0700 In: 2406.3 [P.O.:240; I.V.:2166.3] Out: 2250 [Urine:2250] Intake/Output this shift: Total I/O In: 1406.3 [P.O.:240; I.V.:1166.3] Out: 2250 [Urine:2250]   Basename 03/21/12 1430  HGB 12.2    Basename 03/21/12 1430  WBC 7.7  RBC 4.01  HCT 36.4  PLT 270   No results found for this basename: NA:2,K:2,CL:2,CO2:2,BUN:2,CREATININE:2,GLUCOSE:2,CALCIUM:2 in the last 72 hours No results found for this basename: LABPT:2,INR:2 in the last 72 hours  Patient is conscious alert appropriate appears to be very comfortable she's looking at the main you for breakfast. Her right lower extremity is in a T. ROM brace that ice over the knee the dressing is intact her foot is neuromotor vascularly intact  Assessment/Plan: Postop day #1 status post a right knee arthroscopy, open MCL reconstruction with Achilles tendon allograft doing very well  Plan patient will get physical therapy this morning for crutch instruction sheet and the remainder that T. ROM brace locked at 30 she may toe-touch only for balance otherwise she'll remain in bed with her leg elevated and iced. We'll discharge her home today and see her in the office on Monday for a wound check DVT prophylaxis will be Lovenox 40 mg subcutaneous once a day for 21 days   Franke Menter W 03/22/2012, 6:52 AM

## 2012-03-22 NOTE — Evaluation (Signed)
Physical Therapy Evaluation Patient Details Name: Gabriella Sawyer MRN: [id number] DOB: [birth year] Today's Date: 03/22/2012 Time: 8295-6213 PT Time Calculation (min): 23 min  PT Assessment / Plan / Recommendation Clinical Impression  one time eval completed and filed    PT Assessment  All further PT needs can be met in the next venue of care    Follow Up Recommendations  No PT follow up;Outpatient PT (none at this time; OPPT per MD)    Does the patient have the potential to tolerate intense rehabilitation      Barriers to Discharge        Equipment Recommendations  None recommended by PT    Recommendations for Other Services     Frequency      Precautions / Restrictions Precautions Precautions: None Restrictions RLE Weight Bearing: Touchdown weight bearing   Pertinent Vitals/Pain       Mobility  Bed Mobility Bed Mobility: Supine to Sit Supine to Sit: 4: Min assist Details for Bed Mobility Assistance: min with RLE Transfers Transfers: Sit to Stand;Stand to Sit Sit to Stand: 4: Min guard;5: Supervision;From toilet;From bed Stand to Sit: 4: Min guard;5: Supervision;To toilet;To chair/3-in-1 Details for Transfer Assistance: cues for hands and TDWB Ambulation/Gait Ambulation/Gait Assistance: 6: Modified independent (Device/Increase time);4: Min guard;5: Supervision Ambulation Distance (Feet): 50 Feet Assistive device: Crutches Ambulation/Gait Assistance Details: cues initially TDWB Stairs Assistance: 4: Min guard;5: Supervision Stairs Assistance Details (indicate cue type and reason): cues sequence Stair Management Technique: One rail Right;Forwards;With crutches Number of Stairs: 4     Shoulder Instructions     Exercises     PT Diagnosis:    PT Problem List:   PT Treatment Interventions:     PT Goals    Visit Information  Last PT Received On: 03/22/12 Assistance Needed: +1    Subjective Data  Subjective: i have had 3 surgeries Patient Stated Goal:  home   Prior Functioning  Home Living Lives With: Spouse Available Help at Discharge: Family Type of Home: House Home Access: Stairs to enter Secretary/administrator of Steps: 4 Entrance Stairs-Rails: Right;Left Home Layout: One level Home Adaptive Equipment: Crutches Prior Function Level of Independence: Independent Communication Communication: No difficulties    Cognition  Overall Cognitive Status: Appears within functional limits for tasks assessed/performed Arousal/Alertness: Awake/alert Orientation Level: Appears intact for tasks assessed Behavior During Session: [facility] for tasks performed    Extremity/Trunk Assessment Right Upper Extremity Assessment RUE ROM/Strength/Tone: [facility] for tasks assessed Left Upper Extremity Assessment LUE ROM/Strength/Tone: WFL for tasks assessed Right Lower Extremity Assessment RLE ROM/Strength/Tone: Deficits;Due to precautions RLE ROM/Strength/Tone Deficits: TROM brace 0-30 degrees Left Lower Extremity Assessment LLE ROM/Strength/Tone: WFL for tasks assessed   Balance    End of Session PT - End of Session Equipment Utilized During Treatment: Gait belt Activity Tolerance: Patient tolerated treatment well Patient left: in chair;with call bell/phone within reach;with family/visitor present  GP Functional Assessment Tool Used: clinical judgement Functional Limitation: Mobility: Walking and moving around Mobility: Walking and Moving Around Current Status ([id number]): At least 1 percent but less than 20 percent impaired, limited or restricted Mobility: Walking and Moving Around Goal Status [phone]): 0 percent impaired, limited or restricted Mobility: Walking and Moving Around Discharge Status [id number]): 0 percent impaired, limited or restricted   [facility] 03/22/2012, 9:20 AM

## 2012-03-23 NOTE — Discharge Summary (Signed)
Physician Discharge Summary  Patient ID: Gabriella Sawyer MRN: [id number] DOB/AGE: Nov 28, 1980 31 y.o.  Admit date: 03/21/2012 Discharge date: 03/23/2012  Admission Diagnoses: Right knee pain with MCL insufficiency  Discharge Diagnoses: Status post right knee arthroscopy with open MCL reconstruction with allograft Achilles tendon Active Problems:  * No active hospital problems. *    Discharged Condition: good  Hospital Course: Patient was taken to the OR by Dr. Thomasena Edis take she had a knee arthroscopy on the right which was unremarkable she then also was known to have an MCL insufficiency this procedure was completed for a reconstruction with an allograft Achilles tendon patient tolerated procedure well was transferred to recovery room then to the orthopedic floor for overnight observation for pain management. Patient did very well over the night she had no other complaints she was discharged home for routine followup  Consults: None  Significant Diagnostic Studies: None  Treatments: IV hydration and n.p.o. and IV pain medicine  Discharge Exam: Blood pressure 103/68, pulse 79, temperature 98.7 F (37.1 C), temperature source Oral, resp. rate 16, height 5\' 2"  (1.575 m), weight 81.364 kg (179 lb 6 oz), SpO2 99.00%. Patient is conscious alert appears to be very comfortable in a hospital bed no complaints. Her right lower extremity dressing was intact she is in a T. ROM brace locked at 30 her foot is neuromotor vascularly intact  Disposition: 01-Home or Self Care  Discharge Orders    Future Orders Please Complete By Expires   Diet general      Call MD / Call 911      Comments:   If you experience chest pain or shortness of breath, CALL 911 and be transported to the hospital emergency room.  If you develope a fever above 101 F, pus (white drainage) or increased drainage or redness at the wound, or calf pain, call your surgeon's office.   Increase activity slowly as tolerated      Discharge instructions      Comments:   Please call [phone] for a followup appointment on Monday for wound check. You were to remain in the T. ROM brace locked at 30 at all times. Keep dressing clean and dry will change in the office. Elevate and ice the knee. You may put your toe down to balance with crutches but no weight on the right leg. If any other questions or problems please call the office for instructions       Medication List     As of 03/23/2012  7:15 AM    TAKE these medications         B-12 PO   Place 1 tablet under the tongue daily.      calcium citrate-vitamin D 315-200 MG-UNIT per tablet   Commonly known as: CITRACAL+D   Take 2 tablets by mouth 2 (two) times daily.      DSS 100 MG Caps   Take 100 mg by mouth 2 (two) times daily.      enoxaparin 40 MG/0.4ML injection   Commonly known as: LOVENOX   Inject 0.4 mLs (40 mg total) into the skin daily.      EPINEPHrine 0.3 mg/0.3 mL Devi   Commonly known as: EPI-PEN   Inject 0.3 mg into the muscle once as needed. reaction      HYDROcodone-acetaminophen 5-325 MG per tablet   Commonly known as: NORCO/VICODIN   Take 1 tablet by mouth every 6 (six) hours as needed. Pain      levonorgestrel 20 MCG/24HR  IUD   Commonly known as: MIRENA   1 each by Intrauterine route once.      methocarbamol 500 MG tablet   Commonly known as: ROBAXIN   Take 1 tablet (500 mg total) by mouth every 6 (six) hours as needed.      multivitamin with minerals Tabs   Take 1 tablet by mouth daily.      oxyCODONE 5 MG immediate release tablet   Commonly known as: Oxy IR/ROXICODONE   Take 1-2 tablets (5-10 mg total) by mouth every 3 (three) hours as needed.      promethazine 25 MG tablet   Commonly known as: PHENERGAN   Take 25 mg by mouth every 6 (six) hours as needed. nausea         Signed: [name] 03/23/2012, 7:15 AM

## 2012-05-24 ENCOUNTER — Other Ambulatory Visit (INDEPENDENT_AMBULATORY_CARE_PROVIDER_SITE_OTHER): Payer: Self-pay

## 2012-05-24 ENCOUNTER — Encounter (INDEPENDENT_AMBULATORY_CARE_PROVIDER_SITE_OTHER): Payer: Self-pay

## 2012-05-24 DIAGNOSIS — Z09 Encounter for follow-up examination after completed treatment for conditions other than malignant neoplasm: Secondary | ICD-10-CM

## 2012-05-28 ENCOUNTER — Other Ambulatory Visit (INDEPENDENT_AMBULATORY_CARE_PROVIDER_SITE_OTHER): Payer: Self-pay | Admitting: Surgery

## 2012-05-28 ENCOUNTER — Other Ambulatory Visit (INDEPENDENT_AMBULATORY_CARE_PROVIDER_SITE_OTHER): Payer: Self-pay

## 2012-05-29 LAB — CBC WITH DIFFERENTIAL/PLATELET
Basophils Absolute: 0.1 10*3/uL (ref 0.0–0.1)
Basophils Relative: 1 % (ref 0–1)
Eosinophils Relative: 2 % (ref 0–5)
HCT: 38.1 % (ref 36.0–46.0)
MCHC: 32.3 g/dL (ref 30.0–36.0)
MCV: 92 fL (ref 78.0–100.0)
Monocytes Absolute: 0.4 10*3/uL (ref 0.1–1.0)
Monocytes Relative: 7 % (ref 3–12)
RDW: 13.3 % (ref 11.5–15.5)

## 2012-05-29 LAB — IBC PANEL
%SAT: 11 % — ABNORMAL LOW (ref 20–55)
TIBC: 351 ug/dL (ref 250–470)
UIBC: 312 ug/dL (ref 125–400)

## 2012-05-29 LAB — LIPID PANEL
HDL: 53 mg/dL (ref >39)
LDL Cholesterol: 72 mg/dL (ref 0–99)

## 2012-05-29 LAB — IRON: Iron: 39 ug/dL — ABNORMAL LOW (ref 42–145)

## 2012-05-29 LAB — COMPREHENSIVE METABOLIC PANEL
AST: 11 U/L (ref 0–37)
BUN: 9 mg/dL (ref 6–23)
Calcium: 9.5 mg/dL (ref 8.4–10.5)
Chloride: 107 mEq/L (ref 96–112)
Creat: 0.74 mg/dL (ref 0.50–1.10)
Glucose, Bld: 59 mg/dL — ABNORMAL LOW (ref 70–99)

## 2012-05-29 LAB — MAGNESIUM: Magnesium: 2.1 mg/dL (ref 1.5–2.5)

## 2012-05-29 LAB — VITAMIN B12: Vitamin B-12: 749 pg/mL (ref 211–911)

## 2012-05-29 LAB — VITAMIN D 25 HYDROXY (VIT D DEFICIENCY, FRACTURES): Vit D, 25-Hydroxy: 39 ng/mL (ref 30–89)

## 2012-05-31 ENCOUNTER — Telehealth (INDEPENDENT_AMBULATORY_CARE_PROVIDER_SITE_OTHER): Payer: Self-pay

## 2012-05-31 NOTE — Telephone Encounter (Signed)
Returned pt's call regarding her recent blood work.    LMOM stating that Dr. [name] hasn't reviewed the results yet, and therefore I could not give her the results (Some came back abnormal) and that I would call her next week as soon as he has a chance to review them.

## 2012-06-06 ENCOUNTER — Telehealth (INDEPENDENT_AMBULATORY_CARE_PROVIDER_SITE_OTHER): Payer: Self-pay

## 2012-06-06 NOTE — Telephone Encounter (Signed)
Returned pt's call regarding her recent blood work.  I let the pt know that her lab work came back all within range with the exception of her glucose (59).  Pt asked if this might be why she passed out.  I told her that it could be a possibility.  Pt asked what MM plans to do now.  I explained to her that MM is out of the office, and that I would discuss her with him tomorrow morning and will call her tomorrow afternoon with what he says.  Pt appreciated this.

## 2012-07-05 ENCOUNTER — Telehealth (INDEPENDENT_AMBULATORY_CARE_PROVIDER_SITE_OTHER): Payer: Self-pay | Admitting: *Deleted

## 2012-07-05 NOTE — Telephone Encounter (Signed)
Called to speak to patient regarding her lab work and symptoms.  Patient states understanding at this time.  Patient is going to follow up with her PMD regarding her CBG. Patient states she had just eaten when her CBG showed 59.

## 2012-07-12 ENCOUNTER — Telehealth: Payer: Self-pay

## 2012-07-12 NOTE — Telephone Encounter (Signed)
Patient said awhile back you diagnosed her with PCOS.  Recently another doctor told her she is hypoglycemic.  She asked if the two are related and anything that can be done.

## 2012-07-12 NOTE — Telephone Encounter (Signed)
They are not related. Frequently hyperglycemia is related to PCOS, not hypoglycemia.

## 2012-07-13 NOTE — Telephone Encounter (Signed)
Patient informed. Left detailed message voice mail.

## 2012-08-02 ENCOUNTER — Encounter: Payer: 59 | Attending: Family Medicine | Admitting: *Deleted

## 2012-08-02 DIAGNOSIS — Z713 Dietary counseling and surveillance: Secondary | ICD-10-CM | POA: Insufficient documentation

## 2012-08-02 NOTE — Progress Notes (Addendum)
Follow-up visit:  3.5 Years Post-Operative RYGB Surgery  Medical Nutrition Therapy:  Appt start time: 1200   End time:  1230.  Primary concerns today:  Post-operative bariatric surgery nutrition management. Gabriella Sawyer returns with an 11 lb wt gain since last visit (07/20/12). Having issues with hypoglycemia that comes on without warning. Reports feeling weak and confused with increased hunger during these episodes. Still not very active d/t multiple issues with recent knee surgery. Excessive CHO and fat intake noted. Question if she may be anemic or have nutrient deficiency.   Surgery date: 01/22/08 Start weight @ [facility]: 271.4 lbs  Weight today: 189.0 lbs Weight change: 11.0 lbs Total weight lost: 82.4 lbs  TANITA  BODY COMP RESULTS  07/21/11 08/02/12  BMI (kg/m^2) 32.5 34.6  FM (lbs) 53.5 66.0  FFM (lbs) 124.5 123.0  TBW (lbs) 91.0 90.0   24-Hour Dietary Recall:   Reports drinking sweet tea (1/2 is unsweet).  B: Instant grits OR 1.5 ea Jimmy Dean's sausage biscuits (2-12g pro; 20-30g CHO) S: Kelloggs fiber + protein bar (10g pro; 11g CHO) L: Michealina's frozen entree (16g pro; 39g CHO) S: Kelloggs bar (10g pro; 11g CHO) D: Grilled chicken salad OR 3 oz bratwurst (25g)  Fluid intake: >60 oz Estimated total protein intake:  65-75g  Medications: See medication list.  Supplementation: Not taking regularly.   Using straws: No Drinking while eating: Some Hair loss: Not reported Carbonated beverages: Yes N/V/D/C: No Dumping syndrome: No  Recent physical activity:  None d/t recent knee surgery  Progress Towards Goal(s):  In progress.  Handouts given during visit include:  Modified Bariatric Surgery Post-Op diet  Bariatric Surgery Fast Food Guide  Samples given during visit include:   VerioIQ meter kit: 1 kit Lot: [id number]; Exp: 5/15  VerioIQ strips: 2 boxes @ 10 strips/box Lot: [id number]; Exp: 5/15  Unjury Protein Powder: 1 pkt each Lot: [id number]; Exp: 6/15 Lot: [id number]; Exp:  4/15  Premier Protein Shake: 1 bottle Lot # [id number]; Exp: 02/09/13  Celebrate Iron + C (60 mg): 11 tabs Lot: [id number]; Exp  Nutritional Diagnosis:  NB-1.1 Food and nutrition-related knowledge deficit related to gastric bypass surgery as evidenced by patient-reported lack of knowledge regarding post-op bariatric surgery guidelines.    Intervention:  Nutrition education/reinforcement.  Monitoring/Evaluation:  Dietary intake, exercise, and body weight. Follow up prn.

## 2012-08-02 NOTE — Patient Instructions (Addendum)
Goals:  Follow Specialized Post-Op Diet  Watch carbs - keep to ~15g per meal or snack  1500 mg calcium citrate, 2 MVI (separated), 350-500 mg sublingual B12 Sample schedule:  B:  MVI + B12 Snk: MVI *wait at least 2 hrs then, L: Calcium (400 mg) Snk: Calcium (400 mg) D: Calcium (400 mg) Bed: Calcium (200 mg)  Until you hear from me, take 2 iron tabs of the samples I gave you.

## 2012-08-31 ENCOUNTER — Encounter: Payer: 59 | Admitting: *Deleted

## 2012-09-04 ENCOUNTER — Encounter: Payer: Self-pay | Admitting: Gynecology

## 2012-09-04 ENCOUNTER — Ambulatory Visit (INDEPENDENT_AMBULATORY_CARE_PROVIDER_SITE_OTHER): Payer: 59 | Admitting: Gynecology

## 2012-09-04 VITALS — BP 128/80 | Ht 61.0 in | Wt 189.0 lb

## 2012-09-04 DIAGNOSIS — N949 Unspecified condition associated with female genital organs and menstrual cycle: Secondary | ICD-10-CM

## 2012-09-04 DIAGNOSIS — L680 Hirsutism: Secondary | ICD-10-CM

## 2012-09-04 DIAGNOSIS — B373 Candidiasis of vulva and vagina: Secondary | ICD-10-CM

## 2012-09-04 DIAGNOSIS — Z30431 Encounter for routine checking of intrauterine contraceptive device: Secondary | ICD-10-CM

## 2012-09-04 DIAGNOSIS — N898 Other specified noninflammatory disorders of vagina: Secondary | ICD-10-CM

## 2012-09-04 DIAGNOSIS — Z01419 Encounter for gynecological examination (general) (routine) without abnormal findings: Secondary | ICD-10-CM

## 2012-09-04 DIAGNOSIS — R102 Pelvic and perineal pain: Secondary | ICD-10-CM

## 2012-09-04 LAB — WET PREP FOR TRICH, YEAST, CLUE
Clue Cells Wet Prep HPF POC: NONE SEEN
Trich, Wet Prep: NONE SEEN

## 2012-09-04 MED ORDER — FLUCONAZOLE 150 MG PO TABS: 150.0000 mg | ORAL_TABLET | Freq: Once | ORAL | Status: DC

## 2012-09-04 NOTE — Progress Notes (Signed)
NO MENSES DUE TO IUD 

## 2012-09-04 NOTE — Patient Instructions (Signed)
Followup for ultrasound as scheduled and blood work results. 

## 2012-09-04 NOTE — Progress Notes (Signed)
ANBER MCKIVER July 06, 1980 [id number]        32 y.o.  G2P2002 for annual exam.  Several issues noted below.  Past medical history,surgical history, medications, allergies, family history and social history were all reviewed and documented in the EPIC chart.  ROS:  Performed and pertinent positives and negatives are included in the history, assessment and plan .  Exam: Biomedical scientist Filed Vitals:   09/04/12 1542  BP: 128/80  Height: 5\' 1"  (1.549 m)  Weight: 189 lb (85.73 kg)   General appearance  Normal Skin grossly normal. Terminal hair noted sideburns/chin. Head/Neck normal with no cervical or supraclavicular adenopathy thyroid normal Lungs  clear Cardiac RR, without RMG Abdominal  soft, nontender, without masses, organomegaly or hernia Breasts  examined lying and sitting without masses, retractions, discharge or axillary adenopathy. Pelvic  Ext/BUS/vagina  normal   Cervix  normal with IUD string palpated  Uterus  anteverted, normal size, shape and contour, midline and mobile nontender   Adnexa  Without masses or tenderness    Anus and perineum  normal   Rectovaginal  normal sphincter tone without palpated masses or tenderness.    Assessment/Plan:  31 y.o. [id number] female for annual exam.   1. Mirena IUD 02/2011. Patient amenorrheic. IUD string palpated. 2. Hirsutism. Patient notes worsening facial hair growth. Some nodular acne. No other signs such as chest hair/acne, balding, clitorimegaly. Does have history of PCOS. Discussed with her Mirena IUD with progesterone absorption is compounding issue. Ovaries will continue to function with the Mirena IUD. Options for ovarian suppression to include oral contraceptives discussed. Referral to dermatologist also reviewed. Will followup after ultrasound per below and further discuss. We'll check androgen studies to include DHEAS 17 hydroxyprogesterone testosterone TSH. 3. Pelvic pain. Patient has bouts of intermittent pelvic cramping  sharp stabbing pain sometimes following intercourse sometimes spontaneously. We'll check urinalysis and ultrasound. Again discussed possibly removing IUD to address hirsutism and pelvic pain issue. Will rediscuss with ultrasound. 4. Vaginal discharge. Wet prep with yeast. Will treat with Diflucan 150 mg x1. 3 refills given as she does have a history of recurrent yeast infections and it is approaching summer. 5. Breast health. SBE monthly reviewed. 6. 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 Had TCA application. Reports no recurrences after exam today is normal. Will continue with self vulvar exams and represent  with any visual/palpable abnormalities. 7. Pap smear 08/2010. No Pap smear done today.  No history of abnormal Pap smears previously. Plan repeat next year at 3 year interval. 8. Health maintenance. Recently had comprehensive metabolic panel and lipid profile which were normal. Followup with above studies    Dara Lords MD, 5:15 PM 09/04/2012

## 2012-09-05 ENCOUNTER — Encounter: Payer: Self-pay | Admitting: Gynecology

## 2012-09-05 LAB — URINALYSIS W MICROSCOPIC + REFLEX CULTURE
Bacteria, UA: NONE SEEN
Bilirubin Urine: NEGATIVE
Ketones, ur: NEGATIVE mg/dL
Nitrite: NEGATIVE
Protein, ur: NEGATIVE mg/dL
Squamous Epithelial / LPF: NONE SEEN
Urobilinogen, UA: 0.2 mg/dL (ref 0.0–1.0)

## 2012-09-19 ENCOUNTER — Encounter: Payer: Self-pay | Admitting: Gynecology

## 2012-09-19 ENCOUNTER — Ambulatory Visit (INDEPENDENT_AMBULATORY_CARE_PROVIDER_SITE_OTHER): Payer: 59

## 2012-09-19 ENCOUNTER — Ambulatory Visit (INDEPENDENT_AMBULATORY_CARE_PROVIDER_SITE_OTHER): Payer: 59 | Admitting: Gynecology

## 2012-09-19 DIAGNOSIS — L680 Hirsutism: Secondary | ICD-10-CM

## 2012-09-19 DIAGNOSIS — N831 Corpus luteum cyst of ovary, unspecified side: Secondary | ICD-10-CM

## 2012-09-19 DIAGNOSIS — R102 Pelvic and perineal pain: Secondary | ICD-10-CM

## 2012-09-19 DIAGNOSIS — N898 Other specified noninflammatory disorders of vagina: Secondary | ICD-10-CM

## 2012-09-19 DIAGNOSIS — N8300 Follicular cyst of ovary, unspecified side: Secondary | ICD-10-CM

## 2012-09-19 DIAGNOSIS — N949 Unspecified condition associated with female genital organs and menstrual cycle: Secondary | ICD-10-CM

## 2012-09-19 MED ORDER — TERCONAZOLE 0.8 % VA CREA: 1.0000 | TOPICAL_CREAM | Freq: Every day | VAGINAL | Status: DC

## 2012-09-19 MED ORDER — DROSPIRENONE-ETHINYL ESTRADIOL 3-0.02 MG PO TABS: 1.0000 | ORAL_TABLET | Freq: Every day | ORAL | Status: DC

## 2012-09-19 NOTE — Patient Instructions (Signed)
Start on birth control pills as we discussed. Used Terazol vaginal cream nightly for 3 nights.

## 2012-09-19 NOTE — Progress Notes (Addendum)
Patient presents for followup ultrasound due to pelvic discomfort.   Ultrasound shows normal uterine size. Endometrial echo 3.5 mm. IUD within normal position. Right and left ovaries with physiologic changes. Cul-de-sac negative.  Assessment and plan: 1. Pelvic discomfort. Ultrasound normal. Patient will monitor at present. If continues may consider removing IUD. 2. Hirsutism. Her lab work to include DHEAS testosterone and 17 hydroxyprogesterone were normal. Testosterone was generous at 73. Again reviewed options and she does want to start low-dose oral contraceptives to see if we can suppress ovarian androgen production. I reviewed the risks of BCPs to include blood clots such as stroke heart attack DVT. She's never smoked otherwise healthy. I recommended starting on Yaz I did discuss possible increased risk of blood clot with this particular birth control pill and she understands and accepts this risk. Patient will go ahead and start. She wants to keep her IUD in place at present which at this point it is reasonable. She will followup and we'll see how she does. 3. Vaginal discharge. Patient was treated for yeast with Diflucan 150 mg times one. She notes her discharge has continued. I prescribed Terazol 3 cream applicator nightly x3. Followup if symptoms persist.

## 2012-11-27 ENCOUNTER — Telehealth: Payer: Self-pay | Admitting: *Deleted

## 2012-11-27 MED ORDER — NORETHINDRONE ACET-ETHINYL EST 1-20 MG-MCG PO TABS: 1.0000 | ORAL_TABLET | Freq: Every day | ORAL | Status: DC

## 2012-11-27 NOTE — Telephone Encounter (Signed)
Pt said she would like the second option.

## 2012-11-27 NOTE — Telephone Encounter (Signed)
Let's try Loestrin 120 equivalent dispense one pack with refills through next annual exam

## 2012-11-27 NOTE — Telephone Encounter (Signed)
Painful bumps where?

## 2012-11-27 NOTE — Telephone Encounter (Signed)
That is unusual with Yaz as it usually prevents or helps with acne. Options would be continue on Yaz for now and see a dermatologist. Second option would be to switch pills a month sure that would help her to switch to a different type of pill and third option would be to stop the pills altogether and see what she does.

## 2012-11-27 NOTE — Telephone Encounter (Signed)
Pt calling c/o painful bumps while taking yaz birth control pills, pt said never had this problem prior to taking pill. Pt would like your recommendation. Please advise

## 2012-11-27 NOTE — Telephone Encounter (Signed)
rx sent, pt informed.  

## 2012-11-27 NOTE — Telephone Encounter (Signed)
Sorry painful acne bumps on her face.

## 2013-01-11 ENCOUNTER — Ambulatory Visit (INDEPENDENT_AMBULATORY_CARE_PROVIDER_SITE_OTHER): Payer: 59 | Admitting: Surgery

## 2013-01-27 NOTE — Progress Notes (Addendum)
Follow-up visit:  3.5 Years Post-Operative RYGB Surgery  Medical Nutrition Therapy:  Appt start time: 1200   End time:  1230.  Primary concerns today:  Post-operative bariatric surgery nutrition management. Gabriella Sawyer returns for f/u with continued dizzy spells when standing too quickly. Hypoglycemic-type episodes continue and reports they started when she began taking pain meds s/p knee surgery. Gave meter and strips to check BG when these episodes occur. Unable to exercise at this time. Has made some modifications to her diet including Special K High Protein cereal with vanilla silk milk. Reports rash under pannus.   Surgery date: 01/22/08 Surgery type: RYGB Start weight @ [facility]: 271.4 lbs  Weight today: 185.5 lbs Weight change: - 3.5 lbs Total weight lost: 85.9 lbs  TANITA  BODY COMP RESULTS  07/21/11 08/02/12 08/31/12  BMI (kg/m^2) 32.5 34.6 33.9  FM (lbs) 53.5 66.0 66.5  FFM (lbs) 124.5 123.0 119.0  TBW (lbs) 91.0 90.0 87.0   24-Hour Dietary Recall:   Reports drinking sweet tea (1/2 is unsweet).   Fluid intake: >60 oz Estimated total protein intake:  50-60g  Medications: See medication list.  Supplementation: Not taking regularly.   Using straws: No Drinking while eating: Some Hair loss: Not reported Carbonated beverages: Yes N/V/D/C: No Dumping syndrome: No  Recent physical activity:  None d/t recent knee surgery  Progress Towards Goal(s):  In progress.    Nutritional Diagnosis:  NB-1.1 Food and nutrition-related knowledge deficit related to dumping syndrome s/p gastric bypass surgery as evidenced by patient-reported lack of knowledge regarding post-op bariatric surgery guidelines.    Intervention:  Nutrition education/reinforcement.  Monitoring/Evaluation:  Dietary intake, exercise, and body weight. Follow up prn.

## 2013-01-27 NOTE — Patient Instructions (Signed)
Goals:  Follow Specialized Post-Op Diet  Watch carbs - keep to ~15g per meal or snack  1500 mg calcium citrate, 2 MVI (separated), 350-500 mg sublingual B12 Sample schedule:  B:  MVI + B12 Snk: MVI *wait at least 2 hrs then, L: Calcium (400 mg) Snk: Calcium (400 mg) D: Calcium (400 mg) Bed: Calcium (200 mg)  Until you hear from me, take 2 iron tabs of the samples I gave you.

## 2013-04-22 ENCOUNTER — Encounter: Payer: Self-pay | Admitting: Gynecology

## 2013-04-22 ENCOUNTER — Ambulatory Visit (INDEPENDENT_AMBULATORY_CARE_PROVIDER_SITE_OTHER): Payer: 59 | Admitting: Gynecology

## 2013-04-22 DIAGNOSIS — N898 Other specified noninflammatory disorders of vagina: Secondary | ICD-10-CM

## 2013-04-22 DIAGNOSIS — M549 Dorsalgia, unspecified: Secondary | ICD-10-CM

## 2013-04-22 DIAGNOSIS — R102 Pelvic and perineal pain: Secondary | ICD-10-CM

## 2013-04-22 DIAGNOSIS — Z30431 Encounter for routine checking of intrauterine contraceptive device: Secondary | ICD-10-CM

## 2013-04-22 DIAGNOSIS — N949 Unspecified condition associated with female genital organs and menstrual cycle: Secondary | ICD-10-CM

## 2013-04-22 LAB — URINALYSIS W MICROSCOPIC + REFLEX CULTURE
Bilirubin Urine: NEGATIVE
GLUCOSE, UA: NEGATIVE mg/dL
HGB URINE DIPSTICK: NEGATIVE
Ketones, ur: NEGATIVE mg/dL
LEUKOCYTES UA: NEGATIVE
NITRITE: NEGATIVE
PH: 5.5 (ref 5.0–8.0)
Protein, ur: NEGATIVE mg/dL
Urobilinogen, UA: 0.2 mg/dL (ref 0.0–1.0)

## 2013-04-22 LAB — WET PREP FOR TRICH, YEAST, CLUE
CLUE CELLS WET PREP: NONE SEEN
Trich, Wet Prep: NONE SEEN

## 2013-04-22 MED ORDER — METRONIDAZOLE 500 MG PO TABS: 500.0000 mg | ORAL_TABLET | Freq: Two times a day (BID) | ORAL | Status: DC

## 2013-04-22 MED ORDER — FLUCONAZOLE 200 MG PO TABS: 200.0000 mg | ORAL_TABLET | Freq: Every day | ORAL | Status: DC

## 2013-04-22 NOTE — Progress Notes (Signed)
Patient presents with several weeks of vaginal discharge on and off. Used OTC antifungal without benefit. Notes some mucousy discharge particularly after intercourse. Is currently on Yaz BCPs for hirsutism. Also has Mirena IUD for menstrual suppression and history of tubal sterilization. Patient wants her ID removed because she's having some low back pain and cramping which she is attributing to her IUD.  Exam with Berenice BoutonKim Assistant Spine straight without CVA tenderness. Abdomen soft nontender without masses guarding rebound organomegaly. Pelvic external BUS vagina with whitish discharge. Cervix normal with IUD string visualized. Uterus normal sized mobile nontender. Adnexa without masses or tenderness.  Assessment and plan: 1. Discharge. Wet prep shows some yeast. Wonder if she is not having also a bacterial vaginosis given the mucousy history. Will cover with Diflucan 200 daily x2 days and Flagyl 500 mg twice a day x7 days. Alcohol avoidance reviewed. 2. Pelvic cramping with some low back pain. No overt UTI symptoms such as frequency dysuria urgency. Exam is normal. Urinalysis is negative. Patient wants her IUD removed. Subsequently the IUD string was grasped with a [place] forceps and her Mirena IUD was removed, shown to the patient and discarded. Will monitor symptoms at present. If they resolve will follow. If cramping or discomfort continue she'll call and we'll schedule an ultrasound. She is due for her annual exam in June and will follow up regardless.

## 2013-04-22 NOTE — Patient Instructions (Signed)
Take Diflucan pill for 2 days. Take Flagyl twice daily for 7 days. Followup if vaginal discharge continues worsens or recurs. Followup in June when you're due for your annual exam.

## 2013-09-05 ENCOUNTER — Encounter: Payer: Self-pay | Admitting: Gynecology

## 2013-09-05 ENCOUNTER — Other Ambulatory Visit (HOSPITAL_COMMUNITY)
Admission: RE | Admit: 2013-09-05 | Discharge: 2013-09-05 | Disposition: A | Discharge status: Home / Self Care | Payer: 59 | Source: Ambulatory Visit | Attending: Gynecology | Admitting: Gynecology

## 2013-09-05 ENCOUNTER — Ambulatory Visit (INDEPENDENT_AMBULATORY_CARE_PROVIDER_SITE_OTHER): Payer: 59 | Admitting: Gynecology

## 2013-09-05 VITALS — BP 120/76 | Ht 62.0 in | Wt 168.0 lb

## 2013-09-05 DIAGNOSIS — Z1151 Encounter for screening for human papillomavirus (HPV): Secondary | ICD-10-CM | POA: Insufficient documentation

## 2013-09-05 DIAGNOSIS — Z124 Encounter for screening for malignant neoplasm of cervix: Secondary | ICD-10-CM | POA: Insufficient documentation

## 2013-09-05 DIAGNOSIS — R87810 Cervical high risk human papillomavirus (HPV) DNA test positive: Secondary | ICD-10-CM | POA: Insufficient documentation

## 2013-09-05 DIAGNOSIS — Z01419 Encounter for gynecological examination (general) (routine) without abnormal findings: Secondary | ICD-10-CM

## 2013-09-05 MED ORDER — DROSPIRENONE-ETHINYL ESTRADIOL 3-0.02 MG PO TABS: 1.0000 | ORAL_TABLET | Freq: Every day | ORAL | Status: DC

## 2013-09-05 NOTE — Progress Notes (Signed)
CECELIA BUTTA 10/06/1980 [id number]        33 y.o.  G2P2002 for annual exam.  Several issues noted below.  Past medical history,surgical history, problem list, medications, allergies, family history and social history were all reviewed and documented as reviewed in the EPIC chart.  ROS:  12 system ROS performed with pertinent positives and negatives included in the history, assessment and plan.  Included Systems: General, HEENT, Neck, Cardiovascular, Pulmonary, Gastrointestinal, Genitourinary, Musculoskeletal, Dermatologic, Endocrine, Hematological, Neurologic, Psychiatric Additional significant findings :  none   Exam: Kim assistant Filed Vitals:   09/05/13 1559  BP: 120/76  Height: 5\' 2"  (1.575 m)  Weight: 168 lb (76.204 kg)   General appearance:  Normal affect, orientation and appearance. Skin: Grossly normal HEENT: Without gross lesions.  No cervical or supraclavicular adenopathy. Thyroid normal.  Lungs:  Clear without wheezing, rales or rhonchi Cardiac: RR, without RMG Abdominal:  Soft, nontender, without masses, guarding, rebound, organomegaly or hernia Breasts:  Examined lying and sitting without masses, retractions, discharge or axillary adenopathy. Pelvic:  Ext/BUS/vagina normal  Cervix normal. Pap/HPV  Uterus anteverted, normal size, shape and contour, midline and mobile nontender   Adnexa  Without masses or tenderness    Anus and perineum  Normal   Rectovaginal  Normal sphincter tone without palpated masses or tenderness.    Assessment/Plan:  33 y.o. [id number] female for annual exam.   1. Irregular menses. Patient had IUD removed in January. Was using for menstrual suppression as she also has a BTL. She was having a lot of cramping and vaginal infections. Has done well without vaginal infections or crampingsince removal. Has not had a period until 08/22/2013 when she had a heavy bleed with clots. She also continues on Yaz BCPs for her hirsutism associated with her PCOS.she  had been amenorrheic with the IUD. Recommend keeping a menstrual calendar for now. Assuming she resumes regular menses and will follow. If prolonged or atypical bleeding then she'll represent for further evaluation. 2. 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 Treated with TCA.  Exam today is normal without evidence of vulvar or vaginal abnormalities. Patient denies any self reported abnormalities. Will continue to monitor. 3. Pap smear. Pap/HPV today. No history of abnormal Pap smears previously. Plan repeat at 3-5 your interval assuming normal. 4. Breast health. SBE monthly review. Plan mammogram closer to 40. 5. Health maintenance. Baseline CBC comprehensive metabolic panel lipid profile urinalysis ordered. Followup if irregular bleeding continues otherwise annually   Note: This document was prepared with digital dictation and possible smart phrase technology. Any transcriptional errors that result from this process are unintentional.   Dara Lords MD, 4:37 PM 09/05/2013

## 2013-09-05 NOTE — Patient Instructions (Addendum)
Followup if irregular bleeding continues. Followup in one year for annual exam.  You may obtain a copy of any labs that were done today by logging onto MyChart as outlined in the instructions provided with your AVS (after visit summary). The office will not call with normal lab results but certainly if there are any significant abnormalities then we will contact you.   Health Maintenance, Female A healthy lifestyle and preventative care can promote health and wellness.  Maintain regular health, dental, and eye exams.  Eat a healthy diet. Foods like vegetables, fruits, whole grains, low-fat dairy products, and lean protein foods contain the nutrients you need without too many calories. Decrease your intake of foods high in solid fats, added sugars, and salt. Get information about a proper diet from your caregiver, if necessary.  Regular physical exercise is one of the most important things you can do for your health. Most adults should get at least 150 minutes of moderate-intensity exercise (any activity that increases your heart rate and causes you to sweat) each week. In addition, most adults need muscle-strengthening exercises on 2 or more days a week.   Maintain a healthy weight. The body mass index (BMI) is a screening tool to identify possible weight problems. It provides an estimate of body fat based on height and weight. Your caregiver can help determine your BMI, and can help you achieve or maintain a healthy weight. For adults 20 years and older:  A BMI below 18.5 is considered underweight.  A BMI of 18.5 to 24.9 is normal.  A BMI of 25 to 29.9 is considered overweight.  A BMI of 30 and above is considered obese.  Maintain normal blood lipids and cholesterol by exercising and minimizing your intake of saturated fat. Eat a balanced diet with plenty of fruits and vegetables. Blood tests for lipids and cholesterol should begin at age 20 and be repeated every 5 years. If your lipid or  cholesterol levels are high, you are over 50, or you are a high risk for heart disease, you may need your cholesterol levels checked more frequently.Ongoing high lipid and cholesterol levels should be treated with medicines if diet and exercise are not effective.  If you smoke, find out from your caregiver how to quit. If you do not use tobacco, do not start.  Lung cancer screening is recommended for adults aged 55 80 years who are at high risk for developing lung cancer because of a history of smoking. Yearly low-dose computed tomography (CT) is recommended for people who have at least a 30-pack-year history of smoking and are a current smoker or have quit within the past 15 years. A pack year of smoking is smoking an average of 1 pack of cigarettes a day for 1 year (for example: 1 pack a day for 30 years or 2 packs a day for 15 years). Yearly screening should continue until the smoker has stopped smoking for at least 15 years. Yearly screening should also be stopped for people who develop a health problem that would prevent them from having lung cancer treatment.  If you are pregnant, do not drink alcohol. If you are breastfeeding, be very cautious about drinking alcohol. If you are not pregnant and choose to drink alcohol, do not exceed 1 drink per day. One drink is considered to be 12 ounces (355 mL) of beer, 5 ounces (148 mL) of wine, or 1.5 ounces (44 mL) of liquor.  Avoid use of street drugs. Do not share needles   with anyone. Ask for help if you need support or instructions about stopping the use of drugs.  High blood pressure causes heart disease and increases the risk of stroke. Blood pressure should be checked at least every 1 to 2 years. Ongoing high blood pressure should be treated with medicines, if weight loss and exercise are not effective.  If you are 55 to 33 years old, ask your caregiver if you should take aspirin to prevent strokes.  Diabetes screening involves taking a blood sample  to check your fasting blood sugar level. This should be done once every 3 years, after age 19, if you are within normal weight and without risk factors for diabetes. Testing should be considered at a younger age or be carried out more frequently if you are overweight and have at least 1 risk factor for diabetes.  Breast cancer screening is essential preventative care for women. You should practice "breast self-awareness." This means understanding the normal appearance and feel of your breasts and may include breast self-examination. Any changes detected, no matter how small, should be reported to a caregiver. Women in their 75s and 30s should have a clinical breast exam (CBE) by a caregiver as part of a regular health exam every 1 to 3 years. After age 7, women should have a CBE every year. Starting at age 76, women should consider having a mammogram (breast X-ray) every year. Women who have a family history of breast cancer should talk to their caregiver about genetic screening. Women at a high risk of breast cancer should talk to their caregiver about having an MRI and a mammogram every year.  Breast cancer gene (BRCA)-related cancer risk assessment is recommended for women who have family members with BRCA-related cancers. BRCA-related cancers include breast, ovarian, tubal, and peritoneal cancers. Having family members with these cancers may be associated with an increased risk for harmful changes (mutations) in the breast cancer genes BRCA1 and BRCA2. Results of the assessment will determine the need for genetic counseling and BRCA1 and BRCA2 testing.  The Pap test is a screening test for cervical cancer. Women should have a Pap test starting at age 91. Between ages 83 and 37, Pap tests should be repeated every 2 years. Beginning at age 78, you should have a Pap test every 3 years as long as the past 3 Pap tests have been normal. If you had a hysterectomy for a problem that was not cancer or a condition  that could lead to cancer, then you no longer need Pap tests. If you are between ages 28 and 39, and you have had normal Pap tests going back 10 years, you no longer need Pap tests. If you have had past treatment for cervical cancer or a condition that could lead to cancer, you need Pap tests and screening for cancer for at least 20 years after your treatment. If Pap tests have been discontinued, risk factors (such as a new sexual partner) need to be reassessed to determine if screening should be resumed. Some women have medical problems that increase the chance of getting cervical cancer. In these cases, your caregiver may recommend more frequent screening and Pap tests.  The human papillomavirus (HPV) test is an additional test that may be used for cervical cancer screening. The HPV test looks for the virus that can cause the cell changes on the cervix. The cells collected during the Pap test can be tested for HPV. The HPV test could be used to screen women aged  30 years and older, and should be used in women of any age who have unclear Pap test results. After the age of 30, women should have HPV testing at the same frequency as a Pap test.  Colorectal cancer can be detected and often prevented. Most routine colorectal cancer screening begins at the age of 50 and continues through age 75. However, your caregiver may recommend screening at an earlier age if you have risk factors for colon cancer. On a yearly basis, your caregiver may provide home test kits to check for hidden blood in the stool. Use of a small camera at the end of a tube, to directly examine the colon (sigmoidoscopy or colonoscopy), can detect the earliest forms of colorectal cancer. Talk to your caregiver about this at age 50, when routine screening begins. Direct examination of the colon should be repeated every 5 to 10 years through age 75, unless early forms of pre-cancerous polyps or small growths are found.  Hepatitis C blood testing is  recommended for all people born from 1945 through 1965 and any individual with known risks for hepatitis C.  Practice safe sex. Use condoms and avoid high-risk sexual practices to reduce the spread of sexually transmitted infections (STIs). Sexually active women aged 25 and younger should be checked for Chlamydia, which is a common sexually transmitted infection. Older women with new or multiple partners should also be tested for Chlamydia. Testing for other STIs is recommended if you are sexually active and at increased risk.  Osteoporosis is a disease in which the bones lose minerals and strength with aging. This can result in serious bone fractures. The risk of osteoporosis can be identified using a bone density scan. Women ages 65 and over and women at risk for fractures or osteoporosis should discuss screening with their caregivers. Ask your caregiver whether you should be taking a calcium supplement or vitamin D to reduce the rate of osteoporosis.  Menopause can be associated with physical symptoms and risks. Hormone replacement therapy is available to decrease symptoms and risks. You should talk to your caregiver about whether hormone replacement therapy is right for you.  Use sunscreen. Apply sunscreen liberally and repeatedly throughout the day. You should seek shade when your shadow is shorter than you. Protect yourself by wearing long sleeves, pants, a wide-brimmed hat, and sunglasses year round, whenever you are outdoors.  Notify your caregiver of new moles or changes in moles, especially if there is a change in shape or color. Also notify your caregiver if a mole is larger than the size of a pencil eraser.  Stay current with your immunizations. Document Released: 10/04/2010 Document Revised: 07/16/2012 Document Reviewed: 10/04/2010 ExitCare Patient Information 2014 [employer].   

## 2013-09-06 LAB — URINALYSIS W MICROSCOPIC + REFLEX CULTURE
CASTS: NONE SEEN
Crystals: NONE SEEN
Glucose, UA: NEGATIVE mg/dL
HGB URINE DIPSTICK: NEGATIVE
NITRITE: NEGATIVE
PROTEIN: NEGATIVE mg/dL
Specific Gravity, Urine: 1.025 (ref 1.005–1.030)
UROBILINOGEN UA: 0.2 mg/dL (ref 0.0–1.0)
pH: 5.5 (ref 5.0–8.0)

## 2013-09-06 LAB — COMPREHENSIVE METABOLIC PANEL
ALK PHOS: 59 U/L (ref 39–117)
ALT: 9 U/L (ref 0–35)
AST: 12 U/L (ref 0–37)
Albumin: 3.9 g/dL (ref 3.5–5.2)
BILIRUBIN TOTAL: 0.4 mg/dL (ref 0.2–1.2)
BUN: 9 mg/dL (ref 6–23)
CO2: 26 mEq/L (ref 19–32)
Calcium: 9.5 mg/dL (ref 8.4–10.5)
Chloride: 105 mEq/L (ref 96–112)
Creat: 0.88 mg/dL (ref 0.50–1.10)
GLUCOSE: 78 mg/dL (ref 70–99)
Potassium: 4 mEq/L (ref 3.5–5.3)
Sodium: 142 mEq/L (ref 135–145)
Total Protein: 6.5 g/dL (ref 6.0–8.3)

## 2013-09-06 LAB — CBC WITH DIFFERENTIAL/PLATELET
BASOS ABS: 0.1 10*3/uL (ref 0.0–0.1)
BASOS PCT: 1 % (ref 0–1)
EOS ABS: 0.1 10*3/uL (ref 0.0–0.7)
Eosinophils Relative: 1 % (ref 0–5)
HCT: 37.1 % (ref 36.0–46.0)
HEMOGLOBIN: 12.3 g/dL (ref 12.0–15.0)
Lymphocytes Relative: 46 % (ref 12–46)
Lymphs Abs: 3 10*3/uL (ref 0.7–4.0)
MCH: 31.4 pg (ref 26.0–34.0)
MCHC: 33.2 g/dL (ref 30.0–36.0)
MCV: 94.6 fL (ref 78.0–100.0)
MONO ABS: 0.5 10*3/uL (ref 0.1–1.0)
MONOS PCT: 7 % (ref 3–12)
NEUTROS PCT: 45 % (ref 43–77)
Neutro Abs: 3 10*3/uL (ref 1.7–7.7)
Platelets: 340 10*3/uL (ref 150–400)
RBC: 3.92 MIL/uL (ref 3.87–5.11)
RDW: 13.2 % (ref 11.5–15.5)
WBC: 6.6 10*3/uL (ref 4.0–10.5)

## 2013-09-06 LAB — LIPID PANEL
CHOL/HDL RATIO: 2.3 ratio
Cholesterol: 151 mg/dL (ref 0–200)
HDL: 65 mg/dL (ref >39)
LDL CALC: 71 mg/dL (ref 0–99)
TRIGLYCERIDES: 76 mg/dL (ref <150)
VLDL: 15 mg/dL (ref 0–40)

## 2013-09-07 LAB — URINE CULTURE
COLONY COUNT: NO GROWTH
ORGANISM ID, BACTERIA: NO GROWTH

## 2013-09-09 LAB — CYTOLOGY - PAP

## 2013-09-11 ENCOUNTER — Encounter: Payer: Self-pay | Admitting: Gynecology

## 2013-09-26 ENCOUNTER — Ambulatory Visit: Payer: 59 | Admitting: Gynecology

## 2013-10-01 ENCOUNTER — Encounter: Payer: Self-pay | Admitting: Gynecology

## 2013-10-01 ENCOUNTER — Ambulatory Visit (INDEPENDENT_AMBULATORY_CARE_PROVIDER_SITE_OTHER): Payer: 59 | Admitting: Gynecology

## 2013-10-01 DIAGNOSIS — R6889 Other general symptoms and signs: Secondary | ICD-10-CM

## 2013-10-01 DIAGNOSIS — IMO0002 Reserved for concepts with insufficient information to code with codable children: Secondary | ICD-10-CM

## 2013-10-01 DIAGNOSIS — L680 Hirsutism: Secondary | ICD-10-CM

## 2013-10-01 NOTE — Patient Instructions (Signed)
Office will call you with the biopsy results and your testosterone level.

## 2013-10-01 NOTE — Addendum Note (Signed)
Addended by: Dara LordsFONTAINE, TIMOTHY P on: 10/01/2013 11:51 AM   Modules accepted: Orders

## 2013-10-01 NOTE — Progress Notes (Signed)
Patient ID: Gabriella Sawyer, female   DOB: 1980-11-13, 33 y.o.   MRN: [id number] Gabriella Campusnna K Peregrina 1980-11-13 [id number]        33 y.o.  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 She does have a history of condylomata and a a hymenal ring biopsy showing VAIN 1 /2013.  Past medical history,surgical history, problem list, medications, allergies, family history and social history were all reviewed and documented in the EPIC chart.  Directed ROS with pertinent positives and negatives documented in the history of present illness/assessment and plan.  Exam: Kim assistant General appearance:  Normal Pelvic: External BUS vagina normal. Cervix grossly normal.  Colposcopy after acetic acid cleanse adequate with small patch of acetowhite area 12:00 transformation zone. Faint translucent white acetowhite change 3:00 transformations. Representative biopsies of both taken. ECC performed. Physical Exam  Genitourinary:       Assessment/Plan:  33 y.o. 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 Prior history of condyloma with VAIN 1. 2 areas of acetowhite change as outlined above. Representative biopsies taken with ECC. Patient will followup for results. I reviewed the issues of dysplasia, high grade/low grade, positive high-risk HPV association and the issues of progression/regression. If biopsies consistent with low-grade changes and plan expectant management with repeat Pap smear in one year. Otherwise then will triage based upon results.  Patient did ask if I would recheck her testosterone level now that she's been on the oral contraceptives. It was in the 70 range last year. We'll go ahead and check her testosterone now.   Note: This document was prepared with digital dictation and possible smart phrase technology. Any transcriptional errors that result from this process are unintentional.   Dara LordsFONTAINE,TIMOTHY P MD, 11:26 AM  10/01/2013

## 2013-10-02 ENCOUNTER — Telehealth: Payer: Self-pay | Admitting: Gynecology

## 2013-10-02 LAB — TESTOSTERONE: TESTOSTERONE: 27 ng/dL (ref 10–70)

## 2013-10-02 NOTE — Telephone Encounter (Signed)
I called the patient with her pathology results from her colposcopic biopsies. Both of the cervical biopsies as well as the ECC were all positive for HGSIL. I reviewed my recommendation to proceed with LEEP. I discussed the procedure with her, the intraoperative and postoperative courses. Risks to include infection, bleeding requiring retreatment, damage to surrounding tissues including vagina bladder rectum and pathology possibilities to include dysplasia found with clear margins, no dysplasia found and cut through lesions. Possibility for future treatments also discussed. HPV relationship reviewed and patient understands we are not eradicating the virus and she is at risk for repeat episodes of dysplasia. The patient's questions were answered and she is going to schedule the LEEP procedure. She is status post BTL and pregnancy risks are not an issue with her.

## 2013-10-03 NOTE — Telephone Encounter (Signed)
Talked with Patient and LEEP is scheduled for July 31.

## 2013-11-01 ENCOUNTER — Encounter: Payer: Self-pay | Admitting: Gynecology

## 2013-11-01 ENCOUNTER — Ambulatory Visit (INDEPENDENT_AMBULATORY_CARE_PROVIDER_SITE_OTHER): Payer: 59 | Admitting: Gynecology

## 2013-11-01 DIAGNOSIS — R6889 Other general symptoms and signs: Secondary | ICD-10-CM

## 2013-11-01 DIAGNOSIS — R87613 High grade squamous intraepithelial lesion on cytologic smear of cervix (HGSIL): Secondary | ICD-10-CM

## 2013-11-01 DIAGNOSIS — IMO0002 Reserved for concepts with insufficient information to code with codable children: Secondary | ICD-10-CM

## 2013-11-01 HISTORY — PX: CERVICAL BIOPSY  W/ LOOP ELECTRODE EXCISION: SUR135

## 2013-11-01 NOTE — Patient Instructions (Signed)
Loop Electrosurgical Excision Procedure Care After Refer to this sheet in the next few weeks. These instructions provide you with information on caring for yourself after your procedure. Your caregiver may also give you more specific instructions. Your treatment has been planned according to current medical practices, but problems sometimes occur. Call your caregiver if you have any problems or questions after your procedure. HOME CARE INSTRUCTIONS   Do not use tampons, douche, or have sexual intercourse for 2 weeks or as directed by your caregiver.  Begin normal activities if you have no or minimal cramping or bleeding, unless directed otherwise by your caregiver.  Take your temperature if you feel sick. Write down your temperature on paper, and tell your caregiver if you have a fever.  Take all medicines as directed by your caregiver.  Keep all your follow-up appointments and Pap tests as directed by your caregiver. SEEK IMMEDIATE MEDICAL CARE IF:   You have bleeding that is heavier or longer than a normal menstrual cycle.  You have bleeding that is bright red.  You have blood clots.  You have a fever.  You have increasing cramps or pain not relieved by medicine.  You develop abdominal pain that does not seem to be related to the same area of earlier cramping and pain.  You are lightheaded, unusually weak, or faint.  You develop painful or bloody urination.  You develop a bad smelling vaginal discharge. MAKE SURE YOU:  Understand these instructions.  Will watch your condition.  Will get help right away if you are not doing well or get worse. Document Released: 12/02/2010 Document Revised: 06/13/2011 Document Reviewed: 12/02/2010 Exit[employer] Patient Information 2015 [employer]. This information is not intended to replace advice given to you by your health care provider. Make sure you discuss any questions you have with your health care provider.  

## 2013-11-01 NOTE — Progress Notes (Signed)
The patient and I reviewed her indications for LEEP:  I reviewed the procedure with her to include the risks and possible pathology results as outlined in my 10/02/2013 telephone encounter. Patient understands and accepts with questions answered.  Procedure:   A pelvic exam was performed and was normal.   The patient was properly grounded and prepared for the procedure. The cervix was visualized with a LEEP speculum cleansed with acetic acid and re-colposcopy performed. The cervix was circumferentially injected with 2% lidocaine solution with epinephrine 1 / 100,000 dilution, a total of 8 cc's were used. The cervix was then stained with Lugol's solution, which allowed for visualization of the transformation zone grossly. The LEEP generator was set at 6360 W cutting 60 W coagulation blend one current. Using the 12 x 20 mm LEEP wand, the LEEP specimen was excised in a  single pass. A subsequent ECC was performed.  Ball coagulation was delivered to the LEEP base to achieve hemostasis and prophylactic Monsel's solution was applied. The specimen was cut open at 3 o'clock and pinned open on the cork and sent to pathology. The patient tolerated the procedure well and postoperative instructions were discussed with her and a postoperative instruction sheet was given to her. Patient knows to follow up for pathology results in several days and then we will discuss the long-term plan as far as follow up screening.   Dara LordsFONTAINE,TIMOTHY P MD, 12:13 PM 11/01/2013

## 2014-02-03 ENCOUNTER — Encounter: Payer: Self-pay | Admitting: Gynecology

## 2014-08-22 ENCOUNTER — Other Ambulatory Visit: Payer: Self-pay | Admitting: Gynecology

## 2014-09-18 ENCOUNTER — Encounter: Payer: Self-pay | Admitting: Gynecology

## 2014-09-18 ENCOUNTER — Ambulatory Visit (INDEPENDENT_AMBULATORY_CARE_PROVIDER_SITE_OTHER): Payer: 59 | Admitting: Gynecology

## 2014-09-18 ENCOUNTER — Other Ambulatory Visit (HOSPITAL_COMMUNITY)
Admission: RE | Admit: 2014-09-18 | Discharge: 2014-09-18 | Disposition: A | Discharge status: Home / Self Care | Payer: 59 | Source: Ambulatory Visit | Attending: Gynecology | Admitting: Gynecology

## 2014-09-18 VITALS — BP 120/76 | Ht 63.0 in | Wt 156.0 lb

## 2014-09-18 DIAGNOSIS — R896 Abnormal cytological findings in specimens from other organs, systems and tissues: Secondary | ICD-10-CM | POA: Diagnosis not present

## 2014-09-18 DIAGNOSIS — Z1151 Encounter for screening for human papillomavirus (HPV): Secondary | ICD-10-CM | POA: Diagnosis present

## 2014-09-18 DIAGNOSIS — Z01419 Encounter for gynecological examination (general) (routine) without abnormal findings: Secondary | ICD-10-CM

## 2014-09-18 DIAGNOSIS — Z01411 Encounter for gynecological examination (general) (routine) with abnormal findings: Secondary | ICD-10-CM | POA: Insufficient documentation

## 2014-09-18 DIAGNOSIS — IMO0002 Reserved for concepts with insufficient information to code with codable children: Secondary | ICD-10-CM

## 2014-09-18 LAB — CBC WITH DIFFERENTIAL/PLATELET
BASOS PCT: 1 % (ref 0–1)
Basophils Absolute: 0.1 10*3/uL (ref 0.0–0.1)
EOS ABS: 0.1 10*3/uL (ref 0.0–0.7)
EOS PCT: 1 % (ref 0–5)
HCT: 36.7 % (ref 36.0–46.0)
Hemoglobin: 12.1 g/dL (ref 12.0–15.0)
Lymphocytes Relative: 46 % (ref 12–46)
Lymphs Abs: 3 10*3/uL (ref 0.7–4.0)
MCH: 31.5 pg (ref 26.0–34.0)
MCHC: 33 g/dL (ref 30.0–36.0)
MCV: 95.6 fL (ref 78.0–100.0)
MPV: 9.5 fL (ref 8.6–12.4)
Monocytes Absolute: 0.5 10*3/uL (ref 0.1–1.0)
Monocytes Relative: 7 % (ref 3–12)
Neutro Abs: 3 10*3/uL (ref 1.7–7.7)
Neutrophils Relative %: 45 % (ref 43–77)
PLATELETS: 371 10*3/uL (ref 150–400)
RBC: 3.84 MIL/uL — ABNORMAL LOW (ref 3.87–5.11)
RDW: 13.3 % (ref 11.5–15.5)
WBC: 6.6 10*3/uL (ref 4.0–10.5)

## 2014-09-18 LAB — LIPID PANEL
CHOLESTEROL: 149 mg/dL (ref 0–200)
HDL: 77 mg/dL (ref >=46)
LDL CALC: 58 mg/dL (ref 0–99)
TRIGLYCERIDES: 68 mg/dL (ref <150)
Total CHOL/HDL Ratio: 1.9 Ratio
VLDL: 14 mg/dL (ref 0–40)

## 2014-09-18 LAB — COMPREHENSIVE METABOLIC PANEL
ALT: 14 U/L (ref 0–35)
AST: 17 U/L (ref 0–37)
Albumin: 4 g/dL (ref 3.5–5.2)
Alkaline Phosphatase: 57 U/L (ref 39–117)
BUN: 10 mg/dL (ref 6–23)
CALCIUM: 9 mg/dL (ref 8.4–10.5)
CO2: 26 mEq/L (ref 19–32)
CREATININE: 0.87 mg/dL (ref 0.50–1.10)
Chloride: 101 mEq/L (ref 96–112)
Glucose, Bld: 84 mg/dL (ref 70–99)
Potassium: 4.2 mEq/L (ref 3.5–5.3)
Sodium: 138 mEq/L (ref 135–145)
Total Bilirubin: 0.3 mg/dL (ref 0.2–1.2)
Total Protein: 6.8 g/dL (ref 6.0–8.3)

## 2014-09-18 LAB — TSH: TSH: 1.397 u[IU]/mL (ref 0.350–4.500)

## 2014-09-18 MED ORDER — DROSPIRENONE-ETHINYL ESTRADIOL 3-0.02 MG PO TABS: 1.0000 | ORAL_TABLET | Freq: Every day | ORAL | Status: DC

## 2014-09-18 NOTE — Addendum Note (Signed)
Addended by: Dayna Barker on: 09/18/2014 04:15 PM   Modules accepted: Orders

## 2014-09-18 NOTE — Progress Notes (Signed)
Gabriella Sawyer Aug 09, 1980 [id number]        34 y.o.  G2P2002 for annual exam.  Several issues noted below.  Past medical history,surgical history, problem list, medications, allergies, family history and social history were all reviewed and documented as reviewed in the EPIC chart.  ROS:  Performed with pertinent positives and negatives included in the history, assessment and plan.   Additional significant findings :  none   Exam: [facility] person Vitals:   09/18/14 1524  BP: 120/76  Height: 5\' 3"  (1.6 m)  Weight: 156 lb (70.761 kg)   General appearance:  Normal affect, orientation and appearance. Skin: Grossly normal HEENT: Without gross lesions.  No cervical or supraclavicular adenopathy. Thyroid normal.  Lungs:  Clear without wheezing, rales or rhonchi Cardiac: RR, without RMG Abdominal:  Soft, nontender, without masses, guarding, rebound, organomegaly or hernia Breasts:  Examined lying and sitting without masses, retractions, discharge or axillary adenopathy. Pelvic:  Ext/BUS/vagina normal  Cervix normal. Pap/HPV  Uterus anteverted, normal size, shape and contour, midline and mobile nontender   Adnexa  Without masses or tenderness    Anus and perineum  Normal   Rectovaginal  Normal sphincter tone without palpated masses or tenderness.    Assessment/Plan:  34 y.o. G81P2002 female for annual exam with regular menses, oral contraceptives/BTL.   1. Oral contraceptives. Patient has history of BTL but also is on Yaz equivalent BCPs for hair growth suppression.  Had testosterone measured last year at 16. Also is having heavy menses that she was using a Mirena IUD to control but had it removed due to recurrent vaginal infections. She had a Mirena previously to this and did well with that. She is contemplating having another Mirena for menstrual suppression will follow up if she decides to do that ultimately. I also discussed referral to dermatology reference to her hair growth at her  choice. Patient this point wants to stay on oral contraceptives I refilled her 1 year. 2. Breakthrough bleeding. Patient has occasional spotting in between periods but this is not consistent each month. Options up to and including ultrasound for endometrial assessment discussed. As it's an occasional event will monitor at present. Will call if it becomes more consistent. 3. Pap/HPV today. 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 LEEP performed 10/2013 with LGSIL positive endocervical margin. ECC was negative. Follow up on Pap smear today. 4. History of VAIN 1 On hymenal ring biopsy 2013. Exam is normal today. Continue with self exams and physician annual exams. 5. Health maintenance. Baseline CBC, comprehensive metabolic panel, lipid profile, urinalysis, TSH ordered. Follow up in 1 year, sooner if she wants to pursue the Mirena IUD.     Dara Lords MD, 3:53 PM 09/18/2014

## 2014-09-18 NOTE — Patient Instructions (Signed)
Follow up if you want to go with the [place] IUD.  You may obtain a copy of any labs that were done today by logging onto MyChart as outlined in the instructions provided with your AVS (after visit summary). The office will not call with normal lab results but certainly if there are any significant abnormalities then we will contact you.   Health Maintenance, Female A healthy lifestyle and preventative care can promote health and wellness.  Maintain regular health, dental, and eye exams.  Eat a healthy diet. Foods like vegetables, fruits, whole grains, low-fat dairy products, and lean protein foods contain the nutrients you need without too many calories. Decrease your intake of foods high in solid fats, added sugars, and salt. Get information about a proper diet from your caregiver, if necessary.  Regular physical exercise is one of the most important things you can do for your health. Most adults should get at least 150 minutes of moderate-intensity exercise (any activity that increases your heart rate and causes you to sweat) each week. In addition, most adults need muscle-strengthening exercises on 2 or more days a week.   Maintain a healthy weight. The body mass index (BMI) is a screening tool to identify possible weight problems. It provides an estimate of body fat based on height and weight. Your caregiver can help determine your BMI, and can help you achieve or maintain a healthy weight. For adults 20 years and older:  A BMI below 18.5 is considered underweight.  A BMI of 18.5 to 24.9 is normal.  A BMI of 25 to 29.9 is considered overweight.  A BMI of 30 and above is considered obese.  Maintain normal blood lipids and cholesterol by exercising and minimizing your intake of saturated fat. Eat a balanced diet with plenty of fruits and vegetables. Blood tests for lipids and cholesterol should begin at age 30 and be repeated every 5 years. If your lipid or cholesterol levels are high, you  are over 50, or you are a high risk for heart disease, you may need your cholesterol levels checked more frequently.Ongoing high lipid and cholesterol levels should be treated with medicines if diet and exercise are not effective.  If you smoke, find out from your caregiver how to quit. If you do not use tobacco, do not start.  Lung cancer screening is recommended for adults aged 76 80 years who are at high risk for developing lung cancer because of a history of smoking. Yearly low-dose computed tomography (CT) is recommended for people who have at least a 30-pack-year history of smoking and are a current smoker or have quit within the past 15 years. A pack year of smoking is smoking an average of 1 pack of cigarettes a day for 1 year (for example: 1 pack a day for 30 years or 2 packs a day for 15 years). Yearly screening should continue until the smoker has stopped smoking for at least 15 years. Yearly screening should also be stopped for people who develop a health problem that would prevent them from having lung cancer treatment.  If you are pregnant, do not drink alcohol. If you are breastfeeding, be very cautious about drinking alcohol. If you are not pregnant and choose to drink alcohol, do not exceed 1 drink per day. One drink is considered to be 12 ounces (355 mL) of beer, 5 ounces (148 mL) of wine, or 1.5 ounces (44 mL) of liquor.  Avoid use of street drugs. Do not share needles with  anyone. Ask for help if you need support or instructions about stopping the use of drugs.  High blood pressure causes heart disease and increases the risk of stroke. Blood pressure should be checked at least every 1 to 2 years. Ongoing high blood pressure should be treated with medicines, if weight loss and exercise are not effective.  If you are 41 to 34 years old, ask your caregiver if you should take aspirin to prevent strokes.  Diabetes screening involves taking a blood sample to check your fasting blood  sugar level. This should be done once every 3 years, after age 55, if you are within normal weight and without risk factors for diabetes. Testing should be considered at a younger age or be carried out more frequently if you are overweight and have at least 1 risk factor for diabetes.  Breast cancer screening is essential preventative care for women. You should practice "breast self-awareness." This means understanding the normal appearance and feel of your breasts and may include breast self-examination. Any changes detected, no matter how small, should be reported to a caregiver. Women in their 35s and 30s should have a clinical breast exam (CBE) by a caregiver as part of a regular health exam every 1 to 3 years. After age 51, women should have a CBE every year. Starting at age 7, women should consider having a mammogram (breast X-ray) every year. Women who have a family history of breast cancer should talk to their caregiver about genetic screening. Women at a high risk of breast cancer should talk to their caregiver about having an MRI and a mammogram every year.  Breast cancer gene (BRCA)-related cancer risk assessment is recommended for women who have family members with BRCA-related cancers. BRCA-related cancers include breast, ovarian, tubal, and peritoneal cancers. Having family members with these cancers may be associated with an increased risk for harmful changes (mutations) in the breast cancer genes BRCA1 and BRCA2. Results of the assessment will determine the need for genetic counseling and BRCA1 and BRCA2 testing.  The Pap test is a screening test for cervical cancer. Women should have a Pap test starting at age 52. Between ages 73 and 75, Pap tests should be repeated every 2 years. Beginning at age 28, you should have a Pap test every 3 years as long as the past 3 Pap tests have been normal. If you had a hysterectomy for a problem that was not cancer or a condition that could lead to cancer,  then you no longer need Pap tests. If you are between ages 47 and 69, and you have had normal Pap tests going back 10 years, you no longer need Pap tests. If you have had past treatment for cervical cancer or a condition that could lead to cancer, you need Pap tests and screening for cancer for at least 20 years after your treatment. If Pap tests have been discontinued, risk factors (such as a new sexual partner) need to be reassessed to determine if screening should be resumed. Some women have medical problems that increase the chance of getting cervical cancer. In these cases, your caregiver may recommend more frequent screening and Pap tests.  The human papillomavirus (HPV) test is an additional test that may be used for cervical cancer screening. The HPV test looks for the virus that can cause the cell changes on the cervix. The cells collected during the Pap test can be tested for HPV. The HPV test could be used to screen women aged 64  years and older, and should be used in women of any age who have unclear Pap test results. After the age of 31, women should have HPV testing at the same frequency as a Pap test.  Colorectal cancer can be detected and often prevented. Most routine colorectal cancer screening begins at the age of 55 and continues through age 47. However, your caregiver may recommend screening at an earlier age if you have risk factors for colon cancer. On a yearly basis, your caregiver may provide home test kits to check for hidden blood in the stool. Use of a small camera at the end of a tube, to directly examine the colon (sigmoidoscopy or colonoscopy), can detect the earliest forms of colorectal cancer. Talk to your caregiver about this at age 73, when routine screening begins. Direct examination of the colon should be repeated every 5 to 10 years through age 40, unless early forms of pre-cancerous polyps or small growths are found.  Hepatitis C blood testing is recommended for all people  born from 30 through 1965 and any individual with known risks for hepatitis C.  Practice safe sex. Use condoms and avoid high-risk sexual practices to reduce the spread of sexually transmitted infections (STIs). Sexually active women aged 29 and younger should be checked for Chlamydia, which is a common sexually transmitted infection. Older women with new or multiple partners should also be tested for Chlamydia. Testing for other STIs is recommended if you are sexually active and at increased risk.  Osteoporosis is a disease in which the bones lose minerals and strength with aging. This can result in serious bone fractures. The risk of osteoporosis can be identified using a bone density scan. Women ages 54 and over and women at risk for fractures or osteoporosis should discuss screening with their caregivers. Ask your caregiver whether you should be taking a calcium supplement or vitamin D to reduce the rate of osteoporosis.  Menopause can be associated with physical symptoms and risks. Hormone replacement therapy is available to decrease symptoms and risks. You should talk to your caregiver about whether hormone replacement therapy is right for you.  Use sunscreen. Apply sunscreen liberally and repeatedly throughout the day. You should seek shade when your shadow is shorter than you. Protect yourself by wearing long sleeves, pants, a wide-brimmed hat, and sunglasses year round, whenever you are outdoors.  Notify your caregiver of new moles or changes in moles, especially if there is a change in shape or color. Also notify your caregiver if a mole is larger than the size of a pencil eraser.  Stay current with your immunizations. Document Released: 10/04/2010 Document Revised: 07/16/2012 Document Reviewed: 10/04/2010 [facility] Patient Information 2014 [place].

## 2014-09-19 LAB — URINALYSIS W MICROSCOPIC + REFLEX CULTURE
BILIRUBIN URINE: NEGATIVE
Bacteria, UA: NONE SEEN
CRYSTALS: NONE SEEN
Casts: NONE SEEN
Glucose, UA: NEGATIVE mg/dL
Hgb urine dipstick: NEGATIVE
Ketones, ur: NEGATIVE mg/dL
Leukocytes, UA: NEGATIVE
Nitrite: NEGATIVE
PH: 6.5 (ref 5.0–8.0)
Protein, ur: NEGATIVE mg/dL
SPECIFIC GRAVITY, URINE: 1.009 (ref 1.005–1.030)
SQUAMOUS EPITHELIAL / LPF: NONE SEEN
Urobilinogen, UA: 0.2 mg/dL (ref 0.0–1.0)

## 2014-09-22 LAB — CYTOLOGY - PAP

## 2014-09-24 ENCOUNTER — Encounter: Payer: Self-pay | Admitting: Gynecology

## 2014-10-08 ENCOUNTER — Encounter: Payer: Self-pay | Admitting: Gynecology

## 2014-10-08 ENCOUNTER — Ambulatory Visit (INDEPENDENT_AMBULATORY_CARE_PROVIDER_SITE_OTHER): Payer: 59 | Admitting: Gynecology

## 2014-10-08 VITALS — BP 112/66

## 2014-10-08 DIAGNOSIS — IMO0002 Reserved for concepts with insufficient information to code with codable children: Secondary | ICD-10-CM

## 2014-10-08 DIAGNOSIS — R896 Abnormal cytological findings in specimens from other organs, systems and tissues: Secondary | ICD-10-CM

## 2014-10-08 NOTE — Progress Notes (Signed)
Gabriella Sawyer [birth year] [id number]        34 y.o.  [id number] with history of LGSIL with positive high-risk HPV screen last year with colposcopic biopsy showing HGSIL. LEEP performed 10/2013 with LGSIL positive endocervical margin. ECC was negative. Pap smear/HPV 09/2014 showed LGSIL with negative high-risk HPV. Patient presents for colposcopy.   Past medical history,surgical history, problem list, medications, allergies, family history and social history were all reviewed and documented in the EPIC chart.  Directed ROS with pertinent positives and negatives documented in the history of present illness/assessment and plan.  Exam: [facility] assistant Filed Vitals:   10/08/14 1056  BP: 112/66   General appearance:  Normal Pelvic external BUS vagina normal. Cervix grossly normal area  Colposcopy after acetic acid cleanses adequate normal. ECC performed. Patient tolerated well. Physical Exam  Genitourinary:       Assessment/Plan:  34 y.o. [id number] with history as above. Colposcopy is adequate normal. ECC performed. Assuming negative or low-grade plan expected management. HPV screen was negative now, positive last year. Hopefully she will spontaneously clear her atypia. Patient will follow up for biopsy results in several days.    Dara LordsFONTAINE,Anthonee Gelin P MD, 11:32 AM 10/08/2014

## 2014-10-08 NOTE — Patient Instructions (Signed)
Office will call you with biopsy results 

## 2014-10-08 NOTE — Addendum Note (Signed)
Addended by: Dayna BarkerGARDNER, Tama Grosz K on: 10/08/2014 12:01 PM   Modules accepted: Orders

## 2014-12-25 ENCOUNTER — Telehealth: Payer: Self-pay | Admitting: Gynecology

## 2014-12-25 NOTE — Telephone Encounter (Signed)
12/25/14-I LM VM cell for pt to let her know that her insurance covers the Mirena with med dx N94.89 under her $35 copay/wl

## 2015-01-01 ENCOUNTER — Encounter: Payer: Self-pay | Admitting: Gynecology

## 2015-01-01 ENCOUNTER — Ambulatory Visit (INDEPENDENT_AMBULATORY_CARE_PROVIDER_SITE_OTHER): Payer: 59 | Admitting: Gynecology

## 2015-01-01 VITALS — BP 118/76

## 2015-01-01 DIAGNOSIS — Z30430 Encounter for insertion of intrauterine contraceptive device: Secondary | ICD-10-CM | POA: Diagnosis not present

## 2015-01-01 HISTORY — PX: INTRAUTERINE DEVICE INSERTION: SHX323

## 2015-01-01 NOTE — Progress Notes (Signed)
Patient presents for Mirena IUD placement. She has read through the booklet, has no contraindications and signed the consent form.  She is status post BTL and using the Mirena for menstrual suppression. Has had to Mirena's in the past.  I reviewed the insertional process with her as well as the risks to include infection, either immediate or long-term, uterine perforation or migration requiring surgery to remove, other complications such as pain, hormonal side effects and possibility of failure with subsequent pregnancy recognizing she also has a BTL.   Exam with [facility] assistant Pelvic: External BUS vagina normal. Cervix normal . Uterus anteverted normal size shape contour midline mobile nontender. Adnexa without masses or tenderness.  Procedure: The cervix was cleansed with Hibiclens, anterior lip grasped with a single-tooth tenaculum, the uterus was sounded and a Mirena IUD was placed according to manufacturer's recommendations without difficulty. The strings were trimmed. The patient tolerated well and will follow up in one month for a postinsertional check.  Lot number:  [id number]    Dara Lords MD, 8:31 AM 01/01/2015

## 2015-01-01 NOTE — Patient Instructions (Signed)
Intrauterine Device Insertion Most often, an intrauterine device (IUD) is inserted into the uterus to prevent pregnancy. There are 2 types of IUDs available:  Copper IUD--This type of IUD creates an environment that is not favorable to sperm survival. The mechanism of action of the copper IUD is not known for certain. It can stay in place for 10 years.  Hormone IUD--This type of IUD contains the hormone progestin (synthetic progesterone). The progestin thickens the cervical mucus and prevents sperm from entering the uterus, and it also thins the uterine lining. There is no evidence that the hormone IUD prevents implantation. One hormone IUD can stay in place for up to 5 years, and a different hormone IUD can stay in place for up to 3 years. An IUD is the most cost-effective birth control if left in place for the full duration. It may be removed at any time. LET YOUR HEALTH CARE PROVIDER KNOW ABOUT:  Any allergies you have.  All medicines you are taking, including vitamins, herbs, eye drops, creams, and over-the-counter medicines.  Previous problems you or members of your family have had with the use of anesthetics.  Any blood disorders you have.  Previous surgeries you have had.  Possibility of pregnancy.  Medical conditions you have. RISKS AND COMPLICATIONS  Generally, intrauterine device insertion is a safe procedure. However, as with any procedure, complications can occur. Possible complications include:  Accidental puncture (perforation) of the uterus.  Accidental placement of the IUD either in the muscle layer of the uterus (myometrium) or outside the uterus. If this happens, the IUD can be found essentially floating around the bowels and must be taken out surgically.  The IUD may fall out of the uterus (expulsion). This is more common in women who have recently had a child.   Pregnancy in the fallopian tube (ectopic).  Pelvic inflammatory disease (PID), which is infection of  the uterus and fallopian tubes. The risk of PID is slightly increased in the first 20 days after the IUD is placed, but the overall risk is still very low. BEFORE THE PROCEDURE  Schedule the IUD insertion for when you will have your menstrual period or right after, to make sure you are not pregnant. Placement of the IUD is better tolerated shortly after a menstrual cycle.  You may need to take tests or be examined to make sure you are not pregnant.  You may be required to take a pregnancy test.  You may be required to get checked for sexually transmitted infections (STIs) prior to placement. Placing an IUD in someone who has an infection can make the infection worse.  You may be given a pain reliever to take 1 or 2 hours before the procedure.  An exam will be performed to determine the size and position of your uterus.  Ask your health care provider about changing or stopping your regular medicines. PROCEDURE   A tool (speculum) is placed in the vagina. This allows your health care provider to see the lower part of the uterus (cervix).  The cervix is prepped with a medicine that lowers the risk of infection.  You may be given a medicine to numb each side of the cervix (intracervical or paracervical block). This is used to block and control any discomfort with insertion.  A tool (uterine sound) is inserted into the uterus to determine the length of the uterine cavity and the direction the uterus may be tilted.  A slim instrument (IUD inserter) is inserted through the cervical   canal and into your uterus.  The IUD is placed in the uterine cavity and the insertion device is removed.  The nylon string that is attached to the IUD and used for eventual IUD removal is trimmed. It is trimmed so that it lays high in the vagina, just outside the cervix. AFTER THE PROCEDURE  You may have bleeding after the procedure. This is normal. It varies from light spotting for a few days to menstrual-like  bleeding.  You may have mild cramping. Document Released: 11/17/2010 Document Revised: 01/09/2013 Document Reviewed: 09/09/2012 [employer] Patient Information 2015 [employer]. This information is not intended to replace advice given to you by your health care provider. Make sure you discuss any questions you have with your health care provider.  

## 2015-01-28 ENCOUNTER — Encounter: Payer: Self-pay | Admitting: Gynecology

## 2015-01-28 ENCOUNTER — Ambulatory Visit (INDEPENDENT_AMBULATORY_CARE_PROVIDER_SITE_OTHER): Payer: 59 | Admitting: Gynecology

## 2015-01-28 VITALS — BP 116/74

## 2015-01-28 DIAGNOSIS — N926 Irregular menstruation, unspecified: Secondary | ICD-10-CM

## 2015-01-28 DIAGNOSIS — Z30431 Encounter for routine checking of intrauterine contraceptive device: Secondary | ICD-10-CM

## 2015-01-28 MED ORDER — NORETHIN ACE-ETH ESTRAD-FE 1-20 MG-MCG PO TABS: 1.0000 | ORAL_TABLET | Freq: Every day | ORAL | Status: DC

## 2015-01-28 NOTE — Progress Notes (Signed)
Gabriella Sawyer 1980/12/24 [id number]        34 y.o.  [id number] Presents for follow up IUD check having had a Mirena IUD placed last month.  Continues have some irregular bleeding. She is on Yaz equivalent for hair growth suppression but despite this continues have spotting on and off.  Past medical history,surgical history, problem list, medications, allergies, family history and social history were all reviewed and documented in the EPIC chart.  Directed ROS with pertinent positives and negatives documented in the history of present illness/assessment and plan.  Exam: [facility] assistant Filed Vitals:   01/28/15 1511  BP: 116/74   General appearance:  Normal Abdomen soft nontender without masses guarding rebound Pelvic external BUS vagina normal. Cervix normal with IUD string visualized an appropriate length. Uterus normal size midline mobile nontender. Adnexa without masses or tenderness.  Assessment/Plan:  34 y.o. [id number] with:  1. Normal IUD check. Patient will monitor and assuming she does well then we will follow expectantly. 2. Irregular bleeding. She asked about trying to switch her pills that she does want to continue for hair growth suppression due to her PCOS.  We'll try Microgestin 1/20 and see if this doesn't suppress her bleeding. Assuming she does well then will follow up in June/July 2017 when she is due for her annual exam, sooner if irregular bleeding continues or other issues.    Dara LordsFONTAINE,Abriel Hattery P MD, 3:26 PM 01/28/2015

## 2015-01-28 NOTE — Patient Instructions (Signed)
Follow up if irregular bleeding continues. Otherwise follow up in June/July 2017 when due for annual exam

## 2015-09-25 ENCOUNTER — Encounter: Payer: Self-pay | Admitting: Gynecology

## 2015-09-25 ENCOUNTER — Ambulatory Visit (INDEPENDENT_AMBULATORY_CARE_PROVIDER_SITE_OTHER): Payer: 59 | Admitting: Gynecology

## 2015-09-25 VITALS — BP 120/74 | Ht 62.0 in | Wt 178.0 lb

## 2015-09-25 DIAGNOSIS — L680 Hirsutism: Secondary | ICD-10-CM

## 2015-09-25 DIAGNOSIS — IMO0002 Reserved for concepts with insufficient information to code with codable children: Secondary | ICD-10-CM

## 2015-09-25 DIAGNOSIS — Z01419 Encounter for gynecological examination (general) (routine) without abnormal findings: Secondary | ICD-10-CM

## 2015-09-25 DIAGNOSIS — Z13220 Encounter for screening for lipoid disorders: Secondary | ICD-10-CM | POA: Diagnosis not present

## 2015-09-25 DIAGNOSIS — E282 Polycystic ovarian syndrome: Secondary | ICD-10-CM | POA: Diagnosis not present

## 2015-09-25 DIAGNOSIS — R896 Abnormal cytological findings in specimens from other organs, systems and tissues: Secondary | ICD-10-CM

## 2015-09-25 LAB — CBC WITH DIFFERENTIAL/PLATELET
BASOS ABS: 0 {cells}/uL (ref 0–200)
Basophils Relative: 0 %
Eosinophils Absolute: 89 cells/uL (ref 15–500)
Eosinophils Relative: 1 %
HEMATOCRIT: 33.6 % — AB (ref 35.0–45.0)
HEMOGLOBIN: 10.2 g/dL — AB (ref 11.7–15.5)
LYMPHS ABS: 3026 {cells}/uL (ref 850–3900)
Lymphocytes Relative: 34 %
MCH: 26.1 pg — AB (ref 27.0–33.0)
MCHC: 30.4 g/dL — ABNORMAL LOW (ref 32.0–36.0)
MCV: 85.9 fL (ref 80.0–100.0)
MONO ABS: 623 {cells}/uL (ref 200–950)
MPV: 9.5 fL (ref 7.5–12.5)
Monocytes Relative: 7 %
NEUTROS ABS: 5162 {cells}/uL (ref 1500–7800)
Neutrophils Relative %: 58 %
Platelets: 402 10*3/uL — ABNORMAL HIGH (ref 140–400)
RBC: 3.91 MIL/uL (ref 3.80–5.10)
RDW: 16.2 % — ABNORMAL HIGH (ref 11.0–15.0)
WBC: 8.9 10*3/uL (ref 3.8–10.8)

## 2015-09-25 MED ORDER — SPIRONOLACTONE 25 MG PO TABS: 25.0000 mg | ORAL_TABLET | Freq: Every day | ORAL | Status: DC

## 2015-09-25 MED ORDER — DROSPIRENONE-ETHINYL ESTRADIOL 3-0.02 MG PO TABS: 1.0000 | ORAL_TABLET | Freq: Every day | ORAL | Status: DC

## 2015-09-25 NOTE — Addendum Note (Signed)
Addended by: Dayna BarkerGARDNER, Oswell Say K on: 09/25/2015 04:29 PM   Modules accepted: Orders, SmartSet

## 2015-09-25 NOTE — Progress Notes (Addendum)
    Jenne Campusnna K Kirschbaum [birth year] [id number]        35 y.o.  G2P2002  for annual exam.  Several issues noted below.  Past medical history,surgical history, problem list, medications, allergies, family history and social history were all reviewed and documented as reviewed in the EPIC chart.  ROS:  Performed with pertinent positives and negatives included in the history, assessment and plan.   Additional significant findings :  None   Exam: Kennon PortelaKim Gardner assistant Filed Vitals:   09/25/15 1551  BP: 120/74  Height: 5\' 2"  (1.575 m)  Weight: 178 lb (80.74 kg)   General appearance:  Normal affect, orientation and appearance. Skin: Grossly normal HEENT: Without gross lesions.  No cervical or supraclavicular adenopathy. Thyroid normal.  Lungs:  Clear without wheezing, rales or rhonchi Cardiac: RR, without RMG Abdominal:  Soft, nontender, without masses, guarding, rebound, organomegaly or hernia Breasts:  Examined lying and sitting without masses, retractions, discharge or axillary adenopathy. Pelvic:  Ext/BUS/vagina normal  Cervix normal. Pap smear/HPV done. IUD string visualized  Uterus anteverted, normal size, shape and contour, midline and mobile nontender   Adnexa without masses or tenderness    Anus and perineum normal   Rectovaginal normal sphincter tone without palpated masses or tenderness.    Assessment/Plan:  35 y.o. 502P2002 female for annual exam without menses, Mirena IUD.   1. PCOS/menorrhagia/hirsutism. Patient using Mirena IUD for menstrual suppression. Also placed on Yaz equivalent due to hirsutism and history of PCOS noting a  testosterone of 27 two years ago. Still complaining of significant hair growth under her chin. Discussed various options and ultimately will be to go ahead and add spironolactone 25 mg daily 2 weeks then 50 mg daily. She is to call me in several months in follow up and will see how she is doing. I did recommend she come back in 3-6 months to recheck  electro-lyte level once we stabilize her on her spironolactone. 2. History of HGSIL.  Patient had LGSIL with positive high-risk HPV screen 2015 with colposcopic biopsy showing HGSIL. LEEP performed 10/2013 with LGSIL positive endocervical margin. ECC was negative. Pap smear/HPV 09/2014 showed LGSIL with negative high-risk HPV. She subsequently had an adequate negative colposcopy with negative ECC 10/2014. Pap smear/HPV done today. 3. Breast health. SBE monthly reviewed. Screening mammographic recommendations between 35 and 40 reviewed. Patient has no strong family history of first wait closer to 40. 4. Health maintenance. Baseline CBC, CMP, lipid profile, urinalysis ordered. Follow up 1 year, sooner as needed.   Dara LordsFONTAINE,Curvin Hunger P MD, 4:17 PM 09/25/2015

## 2015-09-25 NOTE — Patient Instructions (Signed)
Start on the diuretic pill once daily for 2 weeks and then 2 pills daily. Call me in several months and follow up to see how you're doing.  You may obtain a copy of any labs that were done today by logging onto MyChart as outlined in the instructions provided with your AVS (after visit summary). The office will not call with normal lab results but certainly if there are any significant abnormalities then we will contact you.   Health Maintenance Adopting a healthy lifestyle and getting preventive care can go a long way to promote health and wellness. Talk with your health care provider about what schedule of regular examinations is right for you. This is a good chance for you to check in with your provider about disease prevention and staying healthy. In between checkups, there are plenty of things you can do on your own. Experts have done a lot of research about which lifestyle changes and preventive measures are most likely to keep you healthy. Ask your health care provider for more information. WEIGHT AND DIET  Eat a healthy diet  Be sure to include plenty of vegetables, fruits, low-fat dairy products, and lean protein.  Do not eat a lot of foods high in solid fats, added sugars, or salt.  Get regular exercise. This is one of the most important things you can do for your health.  Most adults should exercise for at least 150 minutes each week. The exercise should increase your heart rate and make you sweat (moderate-intensity exercise).  Most adults should also do strengthening exercises at least twice a week. This is in addition to the moderate-intensity exercise.  Maintain a healthy weight  Body mass index (BMI) is a measurement that can be used to identify possible weight problems. It estimates body fat based on height and weight. Your health care provider can help determine your BMI and help you achieve or maintain a healthy weight.  For females 29 years of age and older:   A BMI  below 18.5 is considered underweight.  A BMI of 18.5 to 24.9 is normal.  A BMI of 25 to 29.9 is considered overweight.  A BMI of 30 and above is considered obese.  Watch levels of cholesterol and blood lipids  You should start having your blood tested for lipids and cholesterol at 35 years of age, then have this test every 5 years.  You may need to have your cholesterol levels checked more often if:  Your lipid or cholesterol levels are high.  You are older than 35 years of age.  You are at high risk for heart disease.  CANCER SCREENING   Lung Cancer  Lung cancer screening is recommended for adults 11-4 years old who are at high risk for lung cancer because of a history of smoking.  A yearly low-dose CT scan of the lungs is recommended for people who:  Currently smoke.  Have quit within the past 15 years.  Have at least a 30-pack-year history of smoking. A pack year is smoking an average of one pack of cigarettes a day for 1 year.  Yearly screening should continue until it has been 15 years since you quit.  Yearly screening should stop if you develop a health problem that would prevent you from having lung cancer treatment.  Breast Cancer  Practice breast self-awareness. This means understanding how your breasts normally appear and feel.  It also means doing regular breast self-exams. Let your health care provider know about any  changes, no matter how small.  If you are in your 20s or 30s, you should have a clinical breast exam (CBE) by a health care provider every 1-3 years as part of a regular health exam.  If you are 65 or older, have a CBE every year. Also consider having a breast X-ray (mammogram) every year.  If you have a family history of breast cancer, talk to your health care provider about genetic screening.  If you are at high risk for breast cancer, talk to your health care provider about having an MRI and a mammogram every year.  Breast cancer gene  (BRCA) assessment is recommended for women who have family members with BRCA-related cancers. BRCA-related cancers include:  Breast.  Ovarian.  Tubal.  Peritoneal cancers.  Results of the assessment will determine the need for genetic counseling and BRCA1 and BRCA2 testing. Cervical Cancer Routine pelvic examinations to screen for cervical cancer are no longer recommended for nonpregnant women who are considered low risk for cancer of the pelvic organs (ovaries, uterus, and vagina) and who do not have symptoms. A pelvic examination may be necessary if you have symptoms including those associated with pelvic infections. Ask your health care provider if a screening pelvic exam is right for you.   The Pap test is the screening test for cervical cancer for women who are considered at risk.  If you had a hysterectomy for a problem that was not cancer or a condition that could lead to cancer, then you no longer need Pap tests.  If you are older than 65 years, and you have had normal Pap tests for the past 10 years, you no longer need to have Pap tests.  If you have had past treatment for cervical cancer or a condition that could lead to cancer, you need Pap tests and screening for cancer for at least 20 years after your treatment.  If you no longer get a Pap test, assess your risk factors if they change (such as having a new sexual partner). This can affect whether you should start being screened again.  Some women have medical problems that increase their chance of getting cervical cancer. If this is the case for you, your health care provider may recommend more frequent screening and Pap tests.  The human papillomavirus (HPV) test is another test that may be used for cervical cancer screening. The HPV test looks for the virus that can cause cell changes in the cervix. The cells collected during the Pap test can be tested for HPV.  The HPV test can be used to screen women 50 years of age and  older. Getting tested for HPV can extend the interval between normal Pap tests from three to five years.  An HPV test also should be used to screen women of any age who have unclear Pap test results.  After 35 years of age, women should have HPV testing as often as Pap tests.  Colorectal Cancer  This type of cancer can be detected and often prevented.  Routine colorectal cancer screening usually begins at 35 years of age and continues through 35 years of age.  Your health care provider may recommend screening at an earlier age if you have risk factors for colon cancer.  Your health care provider may also recommend using home test kits to check for hidden blood in the stool.  A small camera at the end of a tube can be used to examine your colon directly (sigmoidoscopy or colonoscopy).  This is done to check for the earliest forms of colorectal cancer.  Routine screening usually begins at age 65.  Direct examination of the colon should be repeated every 5-10 years through 35 years of age. However, you may need to be screened more often if early forms of precancerous polyps or small growths are found. Skin Cancer  Check your skin from head to toe regularly.  Tell your health care provider about any new moles or changes in moles, especially if there is a change in a mole's shape or color.  Also tell your health care provider if you have a mole that is larger than the size of a pencil eraser.  Always use sunscreen. Apply sunscreen liberally and repeatedly throughout the day.  Protect yourself by wearing long sleeves, pants, a wide-brimmed hat, and sunglasses whenever you are outside. HEART DISEASE, DIABETES, AND HIGH BLOOD PRESSURE   Have your blood pressure checked at least every 1-2 years. High blood pressure causes heart disease and increases the risk of stroke.  If you are between 3 years and 35 years old, ask your health care provider if you should take aspirin to prevent  strokes.  Have regular diabetes screenings. This involves taking a blood sample to check your fasting blood sugar level.  If you are at a normal weight and have a low risk for diabetes, have this test once every three years after 35 years of age.  If you are overweight and have a high risk for diabetes, consider being tested at a younger age or more often. PREVENTING INFECTION  Hepatitis B  If you have a higher risk for hepatitis B, you should be screened for this virus. You are considered at high risk for hepatitis B if:  You were born in a country where hepatitis B is common. Ask your health care provider which countries are considered high risk.  Your parents were born in a high-risk country, and you have not been immunized against hepatitis B (hepatitis B vaccine).  You have HIV or AIDS.  You use needles to inject street drugs.  You live with someone who has hepatitis B.  You have had sex with someone who has hepatitis B.  You get hemodialysis treatment.  You take certain medicines for conditions, including cancer, organ transplantation, and autoimmune conditions. Hepatitis C  Blood testing is recommended for:  Everyone born from [birth year] through 1965.  Anyone with known risk factors for hepatitis C. Sexually transmitted infections (STIs)  You should be screened for sexually transmitted infections (STIs) including gonorrhea and chlamydia if:  You are sexually active and are younger than 35 years of age.  You are older than 35 years of age and your health care provider tells you that you are at risk for this type of infection.  Your sexual activity has changed since you were last screened and you are at an increased risk for chlamydia or gonorrhea. Ask your health care provider if you are at risk.  If you do not have HIV, but are at risk, it may be recommended that you take a prescription medicine daily to prevent HIV infection. This is called pre-exposure prophylaxis  (PrEP). You are considered at risk if:  You are sexually active and do not regularly use condoms or know the HIV status of your partner(s).  You take drugs by injection.  You are sexually active with a partner who has HIV. Talk with your health care provider about whether you are at high risk of  being infected with HIV. If you choose to begin PrEP, you should first be tested for HIV. You should then be tested every 3 months for as long as you are taking PrEP.  PREGNANCY   If you are premenopausal and you may become pregnant, ask your health care provider about preconception counseling.  If you may become pregnant, take 400 to 800 micrograms (mcg) of folic acid every day.  If you want to prevent pregnancy, talk to your health care provider about birth control (contraception). OSTEOPOROSIS AND MENOPAUSE   Osteoporosis is a disease in which the bones lose minerals and strength with aging. This can result in serious bone fractures. Your risk for osteoporosis can be identified using a bone density scan.  If you are 35 years of age or older, or if you are at risk for osteoporosis and fractures, ask your health care provider if you should be screened.  Ask your health care provider whether you should take a calcium or vitamin D supplement to lower your risk for osteoporosis.  Menopause may have certain physical symptoms and risks.  Hormone replacement therapy may reduce some of these symptoms and risks. Talk to your health care provider about whether hormone replacement therapy is right for you.  HOME CARE INSTRUCTIONS   Schedule regular health, dental, and eye exams.  Stay current with your immunizations.   Do not use any tobacco products including cigarettes, chewing tobacco, or electronic cigarettes.  If you are pregnant, do not drink alcohol.  If you are breastfeeding, limit how much and how often you drink alcohol.  Limit alcohol intake to no more than 1 drink per day for  nonpregnant women. One drink equals 12 ounces of beer, 5 ounces of wine, or 1 ounces of hard liquor.  Do not use street drugs.  Do not share needles.  Ask your health care provider for help if you need support or information about quitting drugs.  Tell your health care provider if you often feel depressed.  Tell your health care provider if you have ever been abused or do not feel safe at home. Document Released: 10/04/2010 Document Revised: 08/05/2013 Document Reviewed: 02/20/2013 [facility] Patient Information 2015 [place], [place]. This information is not intended to replace advice given to you by your health care provider. Make sure you discuss any questions you have with your health care provider.

## 2015-09-26 LAB — LIPID PANEL
CHOL/HDL RATIO: 2.1 ratio (ref <=5.0)
CHOLESTEROL: 142 mg/dL (ref 125–200)
HDL: 68 mg/dL (ref >=46)
LDL CALC: 57 mg/dL (ref <130)
Triglycerides: 87 mg/dL (ref <150)
VLDL: 17 mg/dL (ref <30)

## 2015-09-26 LAB — COMPREHENSIVE METABOLIC PANEL
ALBUMIN: 3.8 g/dL (ref 3.6–5.1)
ALT: 24 U/L (ref 6–29)
AST: 52 U/L — AB (ref 10–30)
Alkaline Phosphatase: 63 U/L (ref 33–115)
BILIRUBIN TOTAL: 0.4 mg/dL (ref 0.2–1.2)
BUN: 8 mg/dL (ref 7–25)
CALCIUM: 8.7 mg/dL (ref 8.6–10.2)
CO2: 21 mmol/L (ref 20–31)
Chloride: 103 mmol/L (ref 98–110)
Creat: 0.85 mg/dL (ref 0.50–1.10)
Glucose, Bld: 96 mg/dL (ref 65–99)
Potassium: 3.8 mmol/L (ref 3.5–5.3)
Sodium: 134 mmol/L — ABNORMAL LOW (ref 135–146)
TOTAL PROTEIN: 6.2 g/dL (ref 6.1–8.1)

## 2015-09-26 LAB — URINALYSIS W MICROSCOPIC + REFLEX CULTURE
BILIRUBIN URINE: NEGATIVE
Bacteria, UA: NONE SEEN [HPF]
CASTS: NONE SEEN [LPF]
CRYSTALS: NONE SEEN [HPF]
GLUCOSE, UA: NEGATIVE
Hgb urine dipstick: NEGATIVE
Ketones, ur: NEGATIVE
Leukocytes, UA: NEGATIVE
Nitrite: NEGATIVE
PROTEIN: NEGATIVE
RBC / HPF: NONE SEEN RBC/HPF (ref <=2)
SPECIFIC GRAVITY, URINE: 1.025 (ref 1.001–1.035)
Squamous Epithelial / LPF: NONE SEEN [HPF] (ref <=5)
Yeast: NONE SEEN [HPF]
pH: 5 (ref 5.0–8.0)

## 2015-09-27 LAB — URINE CULTURE
Colony Count: NO GROWTH
Organism ID, Bacteria: NO GROWTH

## 2015-09-29 LAB — PAP IG AND HPV HIGH-RISK: HPV DNA High Risk: NOT DETECTED

## 2015-10-08 ENCOUNTER — Other Ambulatory Visit: Payer: Self-pay | Admitting: Gynecology

## 2016-01-05 ENCOUNTER — Encounter (HOSPITAL_COMMUNITY): Payer: Self-pay

## 2016-02-29 ENCOUNTER — Emergency Department (HOSPITAL_COMMUNITY): Payer: 59

## 2016-02-29 ENCOUNTER — Encounter (HOSPITAL_COMMUNITY): Payer: Self-pay | Admitting: Emergency Medicine

## 2016-02-29 ENCOUNTER — Emergency Department (HOSPITAL_COMMUNITY)
Admission: EM | Admit: 2016-02-29 | Discharge: 2016-02-29 | Disposition: A | Discharge status: Home / Self Care | Payer: 59 | Attending: Emergency Medicine | Admitting: Emergency Medicine

## 2016-02-29 DIAGNOSIS — Y92410 Unspecified street and highway as the place of occurrence of the external cause: Secondary | ICD-10-CM | POA: Diagnosis not present

## 2016-02-29 DIAGNOSIS — Y999 Unspecified external cause status: Secondary | ICD-10-CM | POA: Insufficient documentation

## 2016-02-29 DIAGNOSIS — S00511A Abrasion of lip, initial encounter: Secondary | ICD-10-CM | POA: Insufficient documentation

## 2016-02-29 DIAGNOSIS — Y939 Activity, unspecified: Secondary | ICD-10-CM | POA: Diagnosis not present

## 2016-02-29 DIAGNOSIS — S60221A Contusion of right hand, initial encounter: Secondary | ICD-10-CM | POA: Insufficient documentation

## 2016-02-29 DIAGNOSIS — S0993XA Unspecified injury of face, initial encounter: Secondary | ICD-10-CM | POA: Diagnosis present

## 2016-02-29 DIAGNOSIS — M25561 Pain in right knee: Secondary | ICD-10-CM | POA: Insufficient documentation

## 2016-02-29 DIAGNOSIS — M545 Low back pain: Secondary | ICD-10-CM | POA: Diagnosis not present

## 2016-02-29 DIAGNOSIS — R52 Pain, unspecified: Secondary | ICD-10-CM

## 2016-02-29 DIAGNOSIS — M542 Cervicalgia: Secondary | ICD-10-CM | POA: Diagnosis not present

## 2016-02-29 MED ORDER — IBUPROFEN 800 MG PO TABS: 800.0000 mg | ORAL_TABLET | Freq: Three times a day (TID) | ORAL | 0 refills | Status: DC

## 2016-02-29 MED ORDER — CYCLOBENZAPRINE HCL 10 MG PO TABS: 10.0000 mg | ORAL_TABLET | Freq: Two times a day (BID) | ORAL | 0 refills | Status: DC | PRN

## 2016-02-29 MED ORDER — IBUPROFEN 800 MG PO TABS
800.0000 mg | ORAL_TABLET | Freq: Once | ORAL | Status: AC
  Administered 2016-02-29: 800 mg via ORAL
  Filled 2016-02-29: qty 1
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
## 2016-02-29 NOTE — ED Notes (Signed)
Patient transported to X-ray 
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
## 2016-02-29 NOTE — ED Triage Notes (Signed)
Per [facility] pt was alert and texting on cell phone. Currently at triage, pt is sleepy and states "I am sleepy, my head and arm is hurting."   C-collar placed.
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
## 2016-02-29 NOTE — ED Notes (Signed)
Pt verbalized understanding of discharge instructions and denies any further questions at this time.   

## 2016-05-15 ENCOUNTER — Other Ambulatory Visit: Payer: Self-pay | Admitting: Gynecology

## 2016-07-21 ENCOUNTER — Encounter (HOSPITAL_COMMUNITY): Payer: Self-pay

## 2016-07-21 ENCOUNTER — Emergency Department (HOSPITAL_COMMUNITY)
Admission: EM | Admit: 2016-07-21 | Discharge: 2016-07-21 | Disposition: A | Discharge status: Home / Self Care | Payer: 59 | Attending: Emergency Medicine | Admitting: Emergency Medicine

## 2016-07-21 DIAGNOSIS — R45851 Suicidal ideations: Secondary | ICD-10-CM | POA: Diagnosis present

## 2016-07-21 DIAGNOSIS — Z79899 Other long term (current) drug therapy: Secondary | ICD-10-CM | POA: Insufficient documentation

## 2016-07-21 DIAGNOSIS — F321 Major depressive disorder, single episode, moderate: Secondary | ICD-10-CM | POA: Diagnosis not present

## 2016-07-21 LAB — COMPREHENSIVE METABOLIC PANEL
ALBUMIN: 3.5 g/dL (ref 3.5–5.0)
ALK PHOS: 72 U/L (ref 38–126)
ALT: 12 U/L — AB (ref 14–54)
ANION GAP: 8 (ref 5–15)
AST: 15 U/L (ref 15–41)
BILIRUBIN TOTAL: 0.4 mg/dL (ref 0.3–1.2)
BUN: 14 mg/dL (ref 6–20)
CALCIUM: 8.9 mg/dL (ref 8.9–10.3)
CO2: 26 mmol/L (ref 22–32)
Chloride: 106 mmol/L (ref 101–111)
Creatinine, Ser: 1.05 mg/dL — ABNORMAL HIGH (ref 0.44–1.00)
GFR calc Af Amer: >60 mL/min (ref >60)
GFR calc non Af Amer: >60 mL/min (ref >60)
Glucose, Bld: 91 mg/dL (ref 65–99)
Potassium: 4.4 mmol/L (ref 3.5–5.1)
SODIUM: 140 mmol/L (ref 135–145)
TOTAL PROTEIN: 7.3 g/dL (ref 6.5–8.1)

## 2016-07-21 LAB — RAPID URINE DRUG SCREEN, HOSP PERFORMED
Amphetamines: NOT DETECTED
Barbiturates: NOT DETECTED
Benzodiazepines: POSITIVE — AB
COCAINE: NOT DETECTED
OPIATES: NOT DETECTED
Tetrahydrocannabinol: NOT DETECTED

## 2016-07-21 LAB — CBC
HCT: 34.3 % — ABNORMAL LOW (ref 36.0–46.0)
HEMOGLOBIN: 10 g/dL — AB (ref 12.0–15.0)
MCH: 23.9 pg — ABNORMAL LOW (ref 26.0–34.0)
MCHC: 29.2 g/dL — AB (ref 30.0–36.0)
MCV: 81.9 fL (ref 78.0–100.0)
PLATELETS: 488 10*3/uL — AB (ref 150–400)
RBC: 4.19 MIL/uL (ref 3.87–5.11)
RDW: 15.2 % (ref 11.5–15.5)
WBC: 10.8 10*3/uL — ABNORMAL HIGH (ref 4.0–10.5)

## 2016-07-21 LAB — ACETAMINOPHEN LEVEL

## 2016-07-21 LAB — SALICYLATE LEVEL

## 2016-07-21 LAB — I-STAT BETA HCG BLOOD, ED (MC, WL, AP ONLY): I-stat hCG, quantitative: <5 m[IU]/mL (ref <5)

## 2016-07-21 LAB — ETHANOL: Alcohol, Ethyl (B): <5 mg/dL (ref <5)

## 2016-07-21 MED ORDER — IBUPROFEN 800 MG PO TABS
800.0000 mg | ORAL_TABLET | Freq: Once | ORAL | Status: AC
  Administered 2016-07-21: 800 mg via ORAL
  Filled 2016-07-21: qty 1

## 2016-07-21 NOTE — ED Notes (Signed)
This RN Attempted blood draw x2, notified phlebotomy to obtain blood work.  Patient also unable to void at this time, patient will attempt again when able.

## 2016-07-21 NOTE — ED Provider Notes (Signed)
[facility] Provider Note   CSN: [id number] Arrival date & time: 07/21/16  1022     History   Chief Complaint Chief Complaint  Patient presents with  . Suicidal    HPI Gabriella Sawyer is a 36 y.o. female.  The history is provided by the patient and a parent. No language interpreter was used.  Depression  This is a recurrent problem. The problem occurs constantly. The problem has been gradually worsening. Pertinent negatives include no chest pain and no shortness of breath. Nothing aggravates the symptoms. Nothing relieves the symptoms. She has tried nothing for the symptoms. The treatment provided no relief.    Past Medical History:  Diagnosis Date  . Arthritis   . Knee problem    rt knee  . LGSIL (low grade squamous intraepithelial dysplasia) 09/2013, 09/2014   Positive high risk HPV screen 2015, negative high risk HPV 2016  . PCOS (polycystic ovarian syndrome)   . PONV (postoperative nausea and vomiting)   . VAIN I (vaginal intraepithelial neoplasia grade I) 07/2011   at hymenal ring 4 oclock    Patient Active Problem List   Diagnosis Date Noted  . Major depressive disorder, single episode, moderate (HCC) 07/21/2016  . VAIN I (vaginal intraepithelial neoplasia grade I) 07/29/2011  . PCOS (polycystic ovarian syndrome)     Past Surgical History:  Procedure Laterality Date  . CERVICAL BIOPSY  W/ LOOP ELECTRODE EXCISION  11/01/2013   HGSIL on cervical biopsy, LGSIL on LEEP specimen with endocervical margin involvement  . CESAREAN SECTION  12-03-2001  &  03-24-2003   LAST ONE W/ TUBAL LIGATION  . FOOT SURGERY  2000   left foot, screw inserted  . INTRAUTERINE DEVICE INSERTION  01/01/2015   Mirena  . KNEE ARTHROSCOPY  12/16/2011   Procedure: ARTHROSCOPY KNEE;  Surgeon: Javier Docker, MD;  Location: [facility];  Service: Orthopedics;  Laterality: Right;  Right Knee Arthroscopy with Excision of Tibial Exostosis, debridement right knee medial menisectomy  . KNEE  ARTHROSCOPY  03/21/2012   Procedure: ARTHROSCOPY KNEE;  Surgeon: Eugenia Mcalpine, MD;  Location: [facility];  Service: Orthopedics;  Laterality: Right;  right chondroplasty  . KNEE ARTHROSCOPY W/ MENISCECTOMY  07-08-2011  DR [facility]   RIGHT KNEE  . LAPAROSCOPIC CHOLECYSTECTOMY  12-22-2008  . MEDIAL COLLATERAL LIGAMENT REPAIR, KNEE  03/21/2012   Procedure: RECONSTRUCTION MEDIAL COLLATERAL LIGAMENT (MCL);  Surgeon: Eugenia Mcalpine, MD;  Location: [facility];  Service: Orthopedics;  Laterality: Right;  achilles tendon allograft  . ROUX-EN-Y GASTRIC BYPASS  01-22-2008   LAPAROSCOPIC  . TONSILLECTOMY AND ADENOIDECTOMY  1995  . TUBAL LIGATION    . WISDOM TOOTH EXTRACTION  2001    OB History    Gravida Para Term Preterm AB Living   SAB TAB Ectopic Multiple Live Births                   Home Medications    Prior to Admission medications   Medication Sig Start Date End Date Taking? Authorizing Provider  acetaminophen (TYLENOL) 500 MG tablet Take 1,000 mg by mouth every 6 (six) hours as needed (knee pain.).   Yes Historical Provider, MD  ALPRAZolam Prudy Feeler) 1 MG tablet Take 1 mg by mouth 3 (three) times daily as needed for anxiety. 07/09/16  Yes Historical Provider, MD  BLISOVI FE 1/20 1-20 MG-MCG tablet TAKE 1 TABLET BY MOUTH DAILY. 10/08/15  Yes Dara Lords, MD  Cyanocobalamin (  B-12 PO) Place 1 tablet under the tongue daily.    Yes Historical Provider, MD  EPINEPHrine (EPI-PEN) 0.3 mg/0.3 mL DEVI Inject 0.3 mg into the muscle once as needed. reaction   Yes Historical Provider, MD  gabapentin (NEURONTIN) 300 MG capsule Take 600 mg by mouth 3 (three) times daily. 07/08/16  Yes Historical Provider, MD  levonorgestrel (MIRENA) 20 MCG/24HR IUD 1 each by Intrauterine route once.   Yes Historical Provider, MD  Multiple Vitamin (MULTIVITAMIN WITH MINERALS) TABS Take 1 tablet by mouth daily.   Yes Historical Provider, MD  VIIBRYD 20 MG TABS Take 20 mg by mouth daily. 06/12/16  Yes Historical  Provider, MD  calcium citrate-vitamin D (CITRACAL+D) 315-200 MG-UNIT per tablet Take 2 tablets by mouth 2 (two) times daily.    Historical Provider, MD  cyclobenzaprine (FLEXERIL) 10 MG tablet Take 1 tablet (10 mg total) by mouth 2 (two) times daily as needed for muscle spasms. Patient not taking: Reported on 07/21/2016 02/29/16   Fayrene Helper, PA-C  drospirenone-ethinyl estradiol (LORYNA) 3-0.02 MG tablet Take 1 tablet by mouth daily. Patient not taking: Reported on 07/21/2016 09/25/15   Dara Lords, MD  ibuprofen (ADVIL,MOTRIN) 800 MG tablet Take 1 tablet (800 mg total) by mouth 3 (three) times daily. Patient not taking: Reported on 07/21/2016 02/29/16   Fayrene Helper, PA-C  spironolactone (ALDACTONE) 25 MG tablet TAKE 1 TABLET (25 MG TOTAL) BY MOUTH DAILY (FOR 2 WEEKS). THEN INCREASE TO TWICE DAILY IN 2 WEEKS Patient not taking: Reported on 07/21/2016 05/16/16   Dara Lords, MD  VYVANSE 30 MG capsule Take 30 mg by mouth every morning. 06/15/16   Historical Provider, MD    Family History Family History  Problem Relation Age of Onset  . Diabetes Father   . Hypertension Father   . Cancer Maternal Grandmother     COLON CANCER  . Diabetes Paternal Grandfather     Social History Social History  Substance Use Topics  . Smoking status: Never Smoker  . Smokeless tobacco: Never Used  . Alcohol use No     Allergies   Codeine and Shrimp [shellfish allergy]   Review of Systems Review of Systems  Respiratory: Negative for shortness of breath.   Cardiovascular: Negative for chest pain.  Psychiatric/Behavioral: Positive for depression.  All other systems reviewed and are negative.    Physical Exam Updated Vital Signs BP (!) 145/89 (BP Location: Right Arm)   Pulse (!) 117   Temp 98.2 F (36.8 C) (Oral)   Resp (!) 22   Ht  (1.575 m)   Wt 79.4 kg   SpO2 100%   BMI 32.01 kg/m   Physical Exam  Constitutional: She is oriented to person, place, and time. She appears  well-developed and well-nourished.  HENT:  Head: Normocephalic.  Eyes: EOM are normal.  Neck: Normal range of motion.  Cardiovascular: Normal rate.   Pulmonary/Chest: Effort normal and breath sounds normal.  Abdominal: She exhibits no distension.  Musculoskeletal: Normal range of motion.  Neurological: She is alert and oriented to person, place, and time.  Skin: Skin is warm.  Psychiatric: She has a normal mood and affect.  Nursing note and vitals reviewed.    ED Treatments / Results  Labs (all labs ordered are listed, but only abnormal results are displayed) Labs Reviewed  COMPREHENSIVE METABOLIC PANEL - Abnormal; Notable for the following:       Result Value   Creatinine, Ser 1.05 (*)    ALT 12 (*)  All other components within normal limits  ACETAMINOPHEN LEVEL - Abnormal; Notable for the following:    Acetaminophen (Tylenol), Serum <10 (*)    All other components within normal limits  CBC - Abnormal; Notable for the following:    WBC 10.8 (*)    Hemoglobin 10.0 (*)    HCT 34.3 (*)    MCH 23.9 (*)    MCHC 29.2 (*)    Platelets 488 (*)    All other components within normal limits  RAPID URINE DRUG SCREEN, HOSP PERFORMED - Abnormal; Notable for the following:    Benzodiazepines POSITIVE (*)    All other components within normal limits  ETHANOL  SALICYLATE LEVEL  I-STAT BETA HCG BLOOD, ED ([facility], [facility], [facility] ONLY)    EKG  EKG Interpretation None       Radiology No results found.  Procedures Procedures (including critical care time)  Medications Ordered in ED Medications  ibuprofen (ADVIL,MOTRIN) tablet 800 mg (not administered)     Initial Impression / Assessment and Plan / ED Course  I have reviewed the triage vital signs and the nursing notes.  Pertinent labs & imaging results that were available during my care of the patient were reviewed by me and considered in my medical decision making (see chart for details).     TTS evluated pt and set her up  with intensive outpatient treatment program.    Final Clinical Impressions(s) / ED Diagnoses   Final diagnoses:  Major depressive disorder, single episode, moderate (HCC)    New Prescriptions Current Discharge Medication List    An After Visit Summary was printed and given to the patient. Instructions given per TTS   Elson Areas, PA-C 07/21/16 1450    Lavera Guise, MD 07/21/16 2024

## 2016-07-21 NOTE — Discharge Instructions (Signed)
For your ongoing behavioral health needs, you are advised to follow up with the Mental Health Intensive Outpatient Program (MH-IOP) at the [facility] [place].  Your scheduled start date is Monday, August 01, 2016.  Plan to be there at 8:45 am.  Jeri Modena, the program facilitator, will call you sometime this week to finalize arrangements:       [facility]. [employer]. [address] [place] [address]      Contact person: Jeri Modena, MEd      [phone]

## 2016-07-21 NOTE — ED Notes (Signed)
Bed: [facility] Expected date:  Expected time:  Means of arrival:  Comments: 

## 2016-07-21 NOTE — BH Assessment (Signed)
[facility] Assessment Progress Note  Per Nanine Means, DNP, this pt does not require psychiatric hospitalization at this time.  However, pt would benefit from treatment in the Mental Health Intensive Outpatient Program (MH-IOP) at [facility].  Pt agrees to this.  This Clinical research associate called Jeri Modena, MEd, the program coordinator, to schedule pt for a start date.  Pt is scheduled for Monday, 08/01/2016, and is to be at the [facility] at 08:45.  [place] Sink will call the pt sometime this week.  These details have been included in pt's discharge instructions.  Pt's nurse has been notified.  Doylene Canning, MA Triage Specialist [phone]

## 2016-07-21 NOTE — ED Triage Notes (Addendum)
Patient bib [facility] sherriff office.  Patient states that has become depressed since she lost her job and told her children that she was going to hurt herself.  Patient states that does not have a plan but, is having thoughts of SI.  Patient denies use of alcohol or recreational drugs.  Patient states that she is prescribed Xanax and Neurontin on a daily basis and has taken her morning doses today.

## 2016-07-21 NOTE — ED Notes (Signed)
Patient is A & O x4.  She understood AVS instructions.  

## 2016-07-21 NOTE — ED Notes (Signed)
Pt has been seen and wand by security. 

## 2016-07-21 NOTE — BH Assessment (Addendum)
Assessment Note  Gabriella Sawyer is an 36 y.o. female. She presents to [facility] brought by [employer]. Patient is voluntary. Patient told her 2 children daughter/son) this morning as she was  dropping them off, "I hope that you guys one day get a better mommy that can provide to you". Apparently this made patient's children upset. Patient's daughter told someone at her school that she was afraid that her mother would harm herself. Sheriff was notified and went to patient's home to bring her to [facility].   She reports on-going depressive symptoms onset September 07/21/2016. Patient loss her job of 15 yrs. She worked for the [employer]. Sts that it was a lot of hostility at her job and they ended up terminating her. She has also since had 3 knee surgery's, gastric bypass, and loss her grandfather. Patient at this time feels that she is not able to financially contribute to her household. She takes her children to school every morning and comes back home to be alone. During the day patient applied for multiple jobs and hasn't heard anything about a job lead. She reports feelings of hopelessness, isolating self from others, fatigue, and isolating self from others. Patient's sleeping patterns have increased. She sleeps most of the day. Appetite is fair.   Patient has no previous psychiatric history. No psychiatric diagnosis. She denies current SI, HI, and AVH's. No prior suicide attempts or gestures. No history of violent behaviors. No legal issues. No history of substance abuse. Patient does not have outpatient therapist or psychiatrist. She met with her PCP. Patient is dressed in scrubs. She is tearful and anxious. Speech is normal. Affect is appropriate. Mood is sad and depressed. Patient's spouse is her support system.     Diagnosis: Major Depressive Disorder, Mild, Single Episode   Past Medical History:  Past Medical History:  Diagnosis Date  . Arthritis   . Knee problem    rt knee  . LGSIL (low  grade squamous intraepithelial dysplasia) 09/2013, 09/2014   Positive high risk HPV screen 2015, negative high risk HPV 2016  . PCOS (polycystic ovarian syndrome)   . PONV (postoperative nausea and vomiting)   . VAIN I (vaginal intraepithelial neoplasia grade I) 07/2011   at hymenal ring 4 oclock    Past Surgical History:  Procedure Laterality Date  . CERVICAL BIOPSY  W/ LOOP ELECTRODE EXCISION  11/01/2013   HGSIL on cervical biopsy, LGSIL on LEEP specimen with endocervical margin involvement  . CESAREAN SECTION  12-03-2001  &  03-24-2003   LAST ONE W/ TUBAL LIGATION  . FOOT SURGERY  2000   left foot, screw inserted  . INTRAUTERINE DEVICE INSERTION  01/01/2015   Mirena  . KNEE ARTHROSCOPY  12/16/2011   Procedure: ARTHROSCOPY KNEE;  Surgeon: Johnn Hai, MD;  Location: [facility];  Service: Orthopedics;  Laterality: Right;  Right Knee Arthroscopy with Excision of Tibial Exostosis, debridement right knee medial menisectomy  . KNEE ARTHROSCOPY  03/21/2012   Procedure: ARTHROSCOPY KNEE;  Surgeon: Sydnee Cabal, MD;  Location: [facility];  Service: Orthopedics;  Laterality: Right;  right chondroplasty  . KNEE ARTHROSCOPY W/ MENISCECTOMY  07-08-2011  [facility]   RIGHT KNEE  . LAPAROSCOPIC CHOLECYSTECTOMY  12-22-2008  . MEDIAL COLLATERAL LIGAMENT REPAIR, KNEE  03/21/2012   Procedure: RECONSTRUCTION MEDIAL COLLATERAL LIGAMENT (MCL);  Surgeon: Sydnee Cabal, MD;  Location: [facility];  Service: Orthopedics;  Laterality: Right;  achilles tendon allograft  . ROUX-EN-Y GASTRIC BYPASS  01-22-2008   LAPAROSCOPIC  .  TONSILLECTOMY AND ADENOIDECTOMY  1995  . TUBAL LIGATION    . WISDOM TOOTH EXTRACTION  2001    Family History:  Family History  Problem Relation Age of Onset  . Diabetes Father   . Hypertension Father   . Cancer Maternal Grandmother     COLON CANCER  . Diabetes Paternal Grandfather     Social History:  reports that she has never smoked. She has never used smokeless tobacco. She reports  that she does not drink alcohol or use drugs.  Additional Social History:     CIWA: CIWA-Ar BP: 118/79 Pulse Rate: (!) 106 COWS:    Allergies:  Allergies  Allergen Reactions  . Codeine Nausea And Vomiting  . Shrimp [Shellfish Allergy] Anaphylaxis    Social History:  reports that she has never smoked. She has never used smokeless tobacco. She reports that she does not drink alcohol or use drugs.  Additional Social History:  Alcohol / Drug Use Pain Medications: SEE MAR Prescriptions: SEE MAR Over the Counter: SEE MAR History of alcohol / drug use?: No history of alcohol / drug abuse  CIWA: CIWA-Ar BP: (!) 145/89 Pulse Rate: (!) 117 COWS:    Allergies:  Allergies  Allergen Reactions  . Codeine Nausea And Vomiting  . Shrimp [Shellfish Allergy] Anaphylaxis    Home Medications:  (Not in a hospital admission)  OB/GYN Status:  No LMP recorded. Patient is not currently having periods (Reason: IUD).  General Assessment Data [facility] of Assessment: [facility] TTS Assessment: In system Is this a Tele or Face-to-Face Assessment?: Face-to-Face Is this an Initial Assessment or a Re-assessment for this encounter?: Initial Assessment Marital status: Single Maiden name:  (b/a) Is patient pregnant?: No Pregnancy Status: No Living Arrangements: Other (Comment) ( ) Can pt return to current living arrangement?: No Admission Status: Voluntary Is patient capable of signing voluntary admission?: Yes Referral Source: Self/Family/Friend Insurance type:  Secretary/administrator)     [place] Living Arrangements: Other (Comment) ( ) Legal Guardian: Other: (no legal guardian ) Name of Psychiatrist:  (no psychiatrist ) Name of Therapist:  (no therapist )  Education Status Is patient currently in school?: No Current Grade:  (n/a) Highest grade of school patient has completed:  (n/a) Name of school:  (n/a) Contact person:  (n/a)  Risk to self with the past 6 months Suicidal  Ideation: No-Not Currently/Within Last 6 Months Has patient been a risk to self within the past 6 months prior to admission? : No Suicidal Intent: No-Not Currently/Within Last 6 Months Has patient had any suicidal intent within the past 6 months prior to admission? : No Is patient at risk for suicide?: No Suicidal Plan?: No Has patient had any suicidal plan within the past 6 months prior to admission? : No Access to Means: No What has been your use of drugs/alcohol within the last 12 months?:  (patient denies ) Previous Attempts/Gestures: No How many times?:  (0) Other Self Harm Risks:  (no self harm ) Intentional Self Injurious Behavior: None Family Suicide History: No Recent stressful life event(s): Other (Comment), Loss (Comment), Job Loss, Financial Problems (job loss 12/2015, unable to find employement, death of gfathe) Persecutory voices/beliefs?: No Depression: Yes Depression Symptoms: Feeling angry/irritable, Feeling worthless/self pity, Loss of interest in usual pleasures, Fatigue, Isolating, Tearfulness, Insomnia, Guilt Substance abuse history and/or treatment for substance abuse?: No Suicide prevention information given to non-admitted patients: Not applicable  Risk to Others within the past 6 months Homicidal Ideation: No Does patient  have any lifetime risk of violence toward others beyond the six months prior to admission? : No Thoughts of Harm to Others: No Current Homicidal Intent: No Current Homicidal Plan: No Access to Homicidal Means: No Identified Victim:  (n/a) History of harm to others?: No Assessment of Violence: None Noted Violent Behavior Description:  (patient is calm and cooperative ) Does patient have access to weapons?: No Criminal Charges Pending?: No Does patient have a court date: No Is patient on probation?: No  Psychosis Hallucinations: None noted Delusions: None noted  Mental Status Report Appearance/Hygiene: Disheveled Eye Contact:  Good Motor Activity: Freedom of movement Speech: Logical/coherent Level of Consciousness: Alert Mood: Depressed Affect: Appropriate to circumstance Anxiety Level: None Thought Processes: Coherent, Relevant Judgement: Impaired Orientation: Person, Place, Situation, Time Obsessive Compulsive Thoughts/Behaviors: None  Cognitive Functioning Concentration: Decreased Memory: Recent Intact, Remote Intact IQ: Average Insight: Fair Impulse Control: Fair Appetite: Fair Weight Loss:  (none reported) Weight Gain:  (none reported ) Sleep: Increased Total Hours of Sleep:  ("I sleep all day") Vegetative Symptoms: Staying in bed  ADLScreening [facility] Assessment Services) Patient's cognitive ability adequate to safely complete daily activities?: Yes Patient able to express need for assistance with ADLs?: Yes Independently performs ADLs?: Yes (appropriate for developmental age)  Prior Inpatient Therapy Prior Inpatient Therapy: No Prior Therapy Dates:  (n/a) Prior Therapy Facilty/Provider(s):  (n/a) Reason for Treatment:  (n/a)  Prior Outpatient Therapy Prior Outpatient Therapy: No Prior Therapy Dates:  (n/a) Prior Therapy Facilty/Provider(s):  (n/a) Reason for Treatment:  (n/a) Does patient have an ACCT team?: No Does patient have Intensive In-House Services?  : No Does patient have [facility] services? : No Does patient have P4CC services?: No  ADL Screening (condition at time of admission) Patient's cognitive ability adequate to safely complete daily activities?: Yes Is the patient deaf or have difficulty hearing?: No Does the patient have difficulty seeing, even when wearing glasses/contacts?: No Does the patient have difficulty concentrating, remembering, or making decisions?: No Patient able to express need for assistance with ADLs?: Yes Does the patient have difficulty dressing or bathing?: No Independently performs ADLs?: Yes (appropriate for developmental age) Does the patient  have difficulty walking or climbing stairs?: No Weakness of Legs: None Weakness of Arms/Hands: None  Home Assistive Devices/Equipment Home Assistive Devices/Equipment: None    Abuse/Neglect Assessment (Assessment to be complete while patient is alone) Physical Abuse: Denies Verbal Abuse: Denies Sexual Abuse: Denies Exploitation of patient/patient's resources: Denies Self-Neglect: Denies Values / Beliefs Cultural Requests During Hospitalization: None Spiritual Requests During Hospitalization: None   Advance Directives (For Healthcare) Does Patient Have a Medical Advance Directive?: No Would patient like information on creating a medical advance directive?: No - Patient declined Nutrition Screen- [place] Adult/[facility] Patient's home diet: Regular  Additional Information 1:1 In Past 12 Months?: No CIRT Risk: No Elopement Risk: No Does patient have medical clearance?: Yes     Disposition:  Disposition Initial Assessment Completed for this Encounter: Yes Disposition of Patient: Other dispositions ([facility] [place] Start Date 08/01/2016)   On Site Evaluation by:   Reviewed with Physician:      On Site Evaluation by:   Reviewed with Physician:    Waldon Merl 07/21/2016 1:09 PM

## 2016-07-21 NOTE — ED Notes (Signed)
Patient ambulatory to restroom.  Advised patient to provide urine sample and to place all belongings into patient belonging bag.  Patient is tearful but, cooperative at this time.  Officer is currently at bedside.

## 2016-07-27 ENCOUNTER — Other Ambulatory Visit: Payer: Self-pay | Admitting: Gynecology

## 2016-08-09 ENCOUNTER — Encounter (HOSPITAL_COMMUNITY): Payer: Self-pay

## 2016-09-27 ENCOUNTER — Encounter: Payer: 59 | Admitting: Gynecology

## 2016-09-29 ENCOUNTER — Ambulatory Visit (INDEPENDENT_AMBULATORY_CARE_PROVIDER_SITE_OTHER): Payer: 59 | Admitting: Gynecology

## 2016-09-29 ENCOUNTER — Encounter: Payer: Self-pay | Admitting: Gynecology

## 2016-09-29 VITALS — BP 124/76 | Ht 62.0 in | Wt 196.0 lb

## 2016-09-29 DIAGNOSIS — Z01419 Encounter for gynecological examination (general) (routine) without abnormal findings: Secondary | ICD-10-CM

## 2016-09-29 DIAGNOSIS — E669 Obesity, unspecified: Secondary | ICD-10-CM

## 2016-09-29 LAB — CBC WITH DIFFERENTIAL/PLATELET
Basophils Absolute: 63 cells/uL (ref 0–200)
Basophils Relative: 1 %
EOS PCT: 1 %
Eosinophils Absolute: 63 cells/uL (ref 15–500)
HCT: 33 % — ABNORMAL LOW (ref 35.0–45.0)
HEMOGLOBIN: 9.7 g/dL — AB (ref 11.7–15.5)
LYMPHS ABS: 2583 {cells}/uL (ref 850–3900)
Lymphocytes Relative: 41 %
MCH: 22.9 pg — ABNORMAL LOW (ref 27.0–33.0)
MCHC: 29.4 g/dL — AB (ref 32.0–36.0)
MCV: 78 fL — ABNORMAL LOW (ref 80.0–100.0)
MONOS PCT: 6 %
MPV: 9.4 fL (ref 7.5–12.5)
Monocytes Absolute: 378 cells/uL (ref 200–950)
NEUTROS ABS: 3213 {cells}/uL (ref 1500–7800)
NEUTROS PCT: 51 %
Platelets: 480 10*3/uL — ABNORMAL HIGH (ref 140–400)
RBC: 4.23 MIL/uL (ref 3.80–5.10)
RDW: 17.9 % — ABNORMAL HIGH (ref 11.0–15.0)
WBC: 6.3 10*3/uL (ref 3.8–10.8)

## 2016-09-29 LAB — TSH: TSH: 0.86 mIU/L

## 2016-09-29 MED ORDER — DROSPIRENONE-ETHINYL ESTRADIOL 3-0.02 MG PO TABS: 1.0000 | ORAL_TABLET | Freq: Every day | ORAL | 11 refills | Status: DC

## 2016-09-29 NOTE — Patient Instructions (Signed)
Office will follow up with you in reference to your blood work.

## 2016-09-29 NOTE — Progress Notes (Signed)
    Gabriella Sawyer 11-Aug-1980 [id number]        36 y.o.  G2P2002 for annual exam.    Past medical history,surgical history, problem list, medications, allergies, family history and social history were all reviewed and documented as reviewed in the EPIC chart.  ROS:  Performed with pertinent positives and negatives included in the history, assessment and plan.   Additional significant findings :  None   Exam: Kennon PortelaKim Gardner assistant Vitals:   09/29/16 1124  BP: 124/76  Weight: 196 lb (88.9 kg)  Height: 5\' 2"  (1.575 m)   Body mass index is 35.85 kg/m.  General appearance:  Normal affect, orientation and appearance. Skin: Grossly normal HEENT: Without gross lesions.  No cervical or supraclavicular adenopathy. Thyroid normal.  Lungs:  Clear without wheezing, rales or rhonchi Cardiac: RR, without RMG Abdominal:  Soft, nontender, without masses, guarding, rebound, organomegaly or hernia Breasts:  Examined lying and sitting without masses, retractions, discharge or axillary adenopathy. Pelvic:  Ext, BUS, Vagina: Normal  Cervix: Normal. Pap smear done  Uterus: Anteverted, normal size, shape and contour, midline and mobile nontender   Adnexa: Without masses or tenderness    Anus and perineum: Normal   Rectovaginal: Normal sphincter tone without palpated masses or tenderness.    Assessment/Plan:  36 y.o. 162P2002 female for annual exam without menses, Mirena IUD, tubal sterilization, BCPs.   1. PCOS/history of menorrhagia/hirsutism. Is using Mirena IUD 12/2014 for menstrual suppression with history of BTL. Also using Yaz for hirsutism suppression. Was transiently on spironolactone but discontinued this.  Would like to continue the Yaz for now. Risks again reviewed to include increased risk of thrombosis such as stroke heart attack DVT.  Never smoked. Refill 1 year provided.  Also reviewed potential for metformin. Recently had marginal hemoglobin A1c at another physician's office. Benefits  as far as weight/glucose control and possible PCOS management all discussed. Side effects to include GI reviewed. She had taken this in the past. Will check hemoglobin A1c and glucose today. Will follow up after her labs to discuss whether she wants to start this or not. 2. History of HGSIL.  LGSIL with positive high-risk HPV screen 2015 with colposcopic biopsy showing HGSIL. LEEP performed 10/2013 with LGSIL positive endocervical margin. ECC was negative. Pap smear/HPV 09/2014 showed LGSIL with negative high-risk HPV. She subsequently had an adequate negative colposcopy with negative ECC 10/2014.  Pap smear/HPV 2017 negative. Pap smear done today. 3. Breast health. SBE monthly reviewed. Plan baseline mammogram at 40. No strong family history of breast cancer. 4. Health maintenance. Baseline CBC, CMP, lipid profile, hemoglobin A1c, urinalysis ordered. Follow up for lab results. Follow up in one year for annual exam.   Gabriella Sawyer,Gabriella Kush P MD, 11:53 AM 09/29/2016

## 2016-09-29 NOTE — Addendum Note (Signed)
Addended by: Dayna BarkerGARDNER, KIMBERLY K on: 09/29/2016 12:24 PM   Modules accepted: Orders

## 2016-09-30 ENCOUNTER — Other Ambulatory Visit: Payer: Self-pay | Admitting: Gynecology

## 2016-09-30 DIAGNOSIS — R710 Precipitous drop in hematocrit: Secondary | ICD-10-CM

## 2016-09-30 DIAGNOSIS — R7989 Other specified abnormal findings of blood chemistry: Secondary | ICD-10-CM

## 2016-09-30 LAB — URINALYSIS W MICROSCOPIC + REFLEX CULTURE
BACTERIA UA: NONE SEEN [HPF]
BILIRUBIN URINE: NEGATIVE
Casts: NONE SEEN [LPF]
Crystals: NONE SEEN [HPF]
GLUCOSE, UA: NEGATIVE
Hgb urine dipstick: NEGATIVE
LEUKOCYTES UA: NEGATIVE
Nitrite: NEGATIVE
RBC / HPF: NONE SEEN RBC/HPF (ref <=2)
SPECIFIC GRAVITY, URINE: 1.031 (ref 1.001–1.035)
Yeast: NONE SEEN [HPF]
pH: 5.5 (ref 5.0–8.0)

## 2016-09-30 LAB — COMPREHENSIVE METABOLIC PANEL
ALBUMIN: 4 g/dL (ref 3.6–5.1)
ALT: 11 U/L (ref 6–29)
AST: 14 U/L (ref 10–30)
Alkaline Phosphatase: 88 U/L (ref 33–115)
BILIRUBIN TOTAL: 0.2 mg/dL (ref 0.2–1.2)
BUN: 17 mg/dL (ref 7–25)
CHLORIDE: 103 mmol/L (ref 98–110)
CO2: 18 mmol/L — AB (ref 20–31)
CREATININE: 1.15 mg/dL — AB (ref 0.50–1.10)
Calcium: 9.1 mg/dL (ref 8.6–10.2)
Glucose, Bld: 86 mg/dL (ref 65–99)
Potassium: 3.9 mmol/L (ref 3.5–5.3)
SODIUM: 136 mmol/L (ref 135–146)
Total Protein: 7 g/dL (ref 6.1–8.1)

## 2016-09-30 LAB — LIPID PANEL
CHOL/HDL RATIO: 2.7 ratio (ref <5.0)
CHOLESTEROL: 182 mg/dL (ref <200)
HDL: 68 mg/dL (ref >50)
LDL Cholesterol: 84 mg/dL (ref <100)
TRIGLYCERIDES: 150 mg/dL — AB (ref <150)
VLDL: 30 mg/dL (ref <30)

## 2016-09-30 LAB — HEMOGLOBIN A1C
Hgb A1c MFr Bld: 4.9 % (ref <5.7)
MEAN PLASMA GLUCOSE: 94 mg/dL

## 2016-09-30 MED ORDER — METFORMIN HCL 500 MG PO TABS: ORAL_TABLET | ORAL | 2 refills | Status: DC

## 2016-10-01 LAB — URINE CULTURE

## 2016-10-03 LAB — PAP IG W/ RFLX HPV ASCU

## 2016-10-04 ENCOUNTER — Other Ambulatory Visit: Payer: 59

## 2016-10-04 ENCOUNTER — Other Ambulatory Visit: Payer: Self-pay | Admitting: Gynecology

## 2016-10-04 DIAGNOSIS — R7989 Other specified abnormal findings of blood chemistry: Secondary | ICD-10-CM

## 2016-10-04 DIAGNOSIS — R710 Precipitous drop in hematocrit: Secondary | ICD-10-CM

## 2016-10-04 LAB — IRON: Iron: 14 ug/dL — ABNORMAL LOW (ref 40–190)

## 2016-10-05 LAB — FERRITIN: Ferritin: 5 ng/mL — ABNORMAL LOW (ref 10–154)

## 2016-10-06 LAB — TRANSFERRIN: Transferrin: 361 mg/dL — ABNORMAL HIGH (ref 188–341)

## 2016-12-21 ENCOUNTER — Other Ambulatory Visit: Payer: Self-pay | Admitting: Cardiology

## 2016-12-21 DIAGNOSIS — M25512 Pain in left shoulder: Secondary | ICD-10-CM

## 2016-12-25 ENCOUNTER — Other Ambulatory Visit: Payer: Self-pay | Admitting: Gynecology

## 2016-12-27 ENCOUNTER — Other Ambulatory Visit: Payer: Self-pay

## 2016-12-30 ENCOUNTER — Other Ambulatory Visit: Payer: Self-pay | Admitting: Gynecology

## 2017-01-01 ENCOUNTER — Ambulatory Visit
Admission: RE | Admit: 2017-01-01 | Discharge: 2017-01-01 | Disposition: A | Discharge status: Home / Self Care | Payer: 59 | Source: Ambulatory Visit | Attending: Cardiology | Admitting: Cardiology

## 2017-01-01 DIAGNOSIS — M25512 Pain in left shoulder: Secondary | ICD-10-CM

## 2017-01-02 ENCOUNTER — Other Ambulatory Visit: Payer: Self-pay

## 2017-01-03 ENCOUNTER — Other Ambulatory Visit: Payer: Self-pay

## 2017-02-27 ENCOUNTER — Other Ambulatory Visit: Payer: Self-pay

## 2017-02-27 NOTE — Telephone Encounter (Signed)
I thought patient stop this at last annual exam appointment

## 2017-02-27 NOTE — Telephone Encounter (Signed)
At last visit patient had reported she was not taking this Rx.

## 2017-03-11 ENCOUNTER — Other Ambulatory Visit: Payer: Self-pay | Admitting: Gynecology

## 2017-04-30 ENCOUNTER — Emergency Department
Admission: EM | Admit: 2017-04-30 | Discharge: 2017-04-30 | Disposition: A | Discharge status: Home / Self Care | Payer: 59 | Source: Home / Self Care | Attending: Family Medicine | Admitting: Family Medicine

## 2017-04-30 ENCOUNTER — Encounter: Payer: Self-pay | Admitting: Emergency Medicine

## 2017-04-30 DIAGNOSIS — M436 Torticollis: Secondary | ICD-10-CM

## 2017-04-30 DIAGNOSIS — R42 Dizziness and giddiness: Secondary | ICD-10-CM

## 2017-04-30 DIAGNOSIS — R1115 Cyclical vomiting syndrome unrelated to migraine: Secondary | ICD-10-CM

## 2017-04-30 DIAGNOSIS — R51 Headache: Secondary | ICD-10-CM

## 2017-04-30 DIAGNOSIS — R519 Headache, unspecified: Secondary | ICD-10-CM

## 2017-04-30 LAB — POCT INFLUENZA A/B
Influenza A, POC: NEGATIVE
Influenza B, POC: NEGATIVE

## 2017-04-30 MED ORDER — ONDANSETRON 8 MG PO TBDP
8.0000 mg | ORAL_TABLET | Freq: Once | ORAL | Status: AC
  Administered 2017-04-30: 8 mg via ORAL

## 2017-04-30 NOTE — ED Triage Notes (Signed)
Patient complaining of bilateral ear pain, vomitting x 2 days, HA, dizziness, runny nose, body chills.

## 2017-04-30 NOTE — ED Provider Notes (Signed)
[facility]    CSN: [id number] Arrival date & time: 04/30/17  1629     History   Chief Complaint Chief Complaint  Patient presents with  . Dizziness    HPI Gabriella Sawyer is a 37 y.o. female.   HPI Gabriella Sawyer is a 37 y.o. female presenting to UC with c/o sudden onset, persistent bilateral ear pain, nausea, vomiting, severe dizziness and generalized HA for 2 days.  Associated chills and rhinorrhea. She tried Tylenol once yesterday w/o relief.  She tried going shopping today with her husband but had to sit down and eventually leave due to feeling so bad.  She feels like the room is spinning whenever she opens her eyes and feels like she cannot move her neck as it is sore, stiff, and any movement causes severe dizziness. Denies known sick contacts or recent travel.  No hx of vertigo or migraines. No recent change in medications. No recent head injuries.    Past Medical History:  Diagnosis Date  . Arthritis   . Knee problem    rt knee  . LGSIL (low grade squamous intraepithelial dysplasia) 09/2013, 09/2014   Positive high risk HPV screen 2015, negative high risk HPV 2016  . PCOS (polycystic ovarian syndrome)   . PONV (postoperative nausea and vomiting)   . VAIN I (vaginal intraepithelial neoplasia grade I) 07/2011   at hymenal ring 4 oclock    Patient Active Problem List   Diagnosis Date Noted  . Major depressive disorder, single episode, moderate (HCC) 07/21/2016  . VAIN I (vaginal intraepithelial neoplasia grade I) 07/29/2011  . PCOS (polycystic ovarian syndrome)     Past Surgical History:  Procedure Laterality Date  . CERVICAL BIOPSY  W/ LOOP ELECTRODE EXCISION  11/01/2013   HGSIL on cervical biopsy, LGSIL on LEEP specimen with endocervical margin involvement  . CESAREAN SECTION  12-03-2001  &  03-24-2003   LAST ONE W/ TUBAL LIGATION  . FOOT SURGERY  2000   left foot, screw inserted  . INTRAUTERINE DEVICE INSERTION  01/01/2015   Mirena  . KNEE  ARTHROSCOPY  12/16/2011   Procedure: ARTHROSCOPY KNEE;  Surgeon: Javier Docker, MD;  Location: [facility];  Service: Orthopedics;  Laterality: Right;  Right Knee Arthroscopy with Excision of Tibial Exostosis, debridement right knee medial menisectomy  . KNEE ARTHROSCOPY  03/21/2012   Procedure: ARTHROSCOPY KNEE;  Surgeon: Eugenia Mcalpine, MD;  Location: [facility];  Service: Orthopedics;  Laterality: Right;  right chondroplasty  . KNEE ARTHROSCOPY W/ MENISCECTOMY  07-08-2011  DR [facility]   RIGHT KNEE  . KNEE SURGERY    . LAPAROSCOPIC CHOLECYSTECTOMY  12-22-2008  . MEDIAL COLLATERAL LIGAMENT REPAIR, KNEE  03/21/2012   Procedure: RECONSTRUCTION MEDIAL COLLATERAL LIGAMENT (MCL);  Surgeon: Eugenia Mcalpine, MD;  Location: [facility];  Service: Orthopedics;  Laterality: Right;  achilles tendon allograft  . ROUX-EN-Y GASTRIC BYPASS  01-22-2008   LAPAROSCOPIC  . TONSILLECTOMY AND ADENOIDECTOMY  1995  . TUBAL LIGATION    . WISDOM TOOTH EXTRACTION  2001    OB History    Gravida Para Term Preterm AB Living   2 2 2     2    SAB TAB Ectopic Multiple Live Births                   Home Medications    Prior to Admission medications   Medication Sig Start Date End Date Taking? Authorizing Provider  amphetamine-dextroamphetamine (ADDERALL) 20 MG tablet Take 20 mg by  mouth 2 (two) times daily.    [provider]  calcium citrate-vitamin D (CITRACAL+D) 315-200 MG-UNIT per tablet Take 2 tablets by mouth 2 (two) times daily.    [provider]  Cyanocobalamin (B-12 PO) Place 1 tablet under the tongue daily.     [provider]  EPINEPHrine (EPI-PEN) 0.3 mg/0.3 mL DEVI Inject 0.3 mg into the muscle once as needed. reaction    [provider]  gabapentin (NEURONTIN) 300 MG capsule Take 600 mg by mouth 3 (three) times daily. 07/08/16   [provider]  ibuprofen (ADVIL,MOTRIN) 800 MG tablet Take 1 tablet (800 mg total) by mouth 3 (three) times daily. Patient not taking: Reported  on 07/21/2016 02/29/16   Fayrene Helperran, [place], PA-C  levonorgestrel (MIRENA) 20 MCG/24HR IUD 1 each by Intrauterine route once.    [provider]    Family History Family History  Problem Relation Age of Onset  . Diabetes Father   . Hypertension Father   . Cancer Maternal Grandmother        COLON CANCER  . Diabetes Paternal Grandfather     Social History Social History   Tobacco Use  . Smoking status: Never Smoker  . Smokeless tobacco: Never Used  Substance Use Topics  . Alcohol use: No    Alcohol/week: 0.0 oz  . Drug use: No     Allergies   Codeine and Shrimp [shellfish allergy]   Review of Systems Review of Systems  Constitutional: Positive for chills and fatigue. Negative for fever.  HENT: Positive for congestion, ear pain and rhinorrhea. Negative for sore throat, trouble swallowing and voice change.   Eyes: Positive for photophobia. Negative for visual disturbance.  Respiratory: Negative for cough and shortness of breath.   Cardiovascular: Positive for palpitations. Negative for chest pain.  Gastrointestinal: Positive for nausea and vomiting. Negative for abdominal pain and diarrhea.  Musculoskeletal: Positive for neck pain and neck stiffness. Negative for arthralgias, back pain and myalgias.  Skin: Negative for rash.  Neurological: Positive for dizziness and headaches. Negative for syncope, weakness and light-headedness.     Physical Exam Triage Vital Signs ED Triage Vitals  Enc Vitals Group     BP 04/30/17 1725 119/87     Pulse Rate 04/30/17 1725 (!) 101     Resp --      Temp 04/30/17 1725 98.4 F (36.9 C)     Temp Source 04/30/17 1725 Oral     SpO2 04/30/17 1725 100 %     Weight 04/30/17 1726 187 lb (84.8 kg)     Height 04/30/17 1726 5\' 2"  (1.575 m)     Head Circumference --      Peak Flow --      Pain Score 04/30/17 1726 0     Pain Loc --      Pain Edu? --      Excl. in GC? --    Orthostatic VS for the past 24 hrs:  BP- Lying Pulse- Lying BP-  Sitting Pulse- Sitting BP- Standing at 0 minutes Pulse- Standing at 0 minutes  04/30/17 1847 (!) 153/95 78 (!) 160/112 89 (!) 145/101 94    Updated Vital Signs BP 119/87 (BP Location: Right Arm)   Pulse (!) 101   Temp 98.4 F (36.9 C) (Oral)   Ht 5\' 2"  (1.575 m)   Wt 187 lb (84.8 kg)   SpO2 100%   BMI 34.20 kg/m   Visual Acuity Right Eye Distance:   Left Eye Distance:  Bilateral Distance:    Right Eye Near:   Left Eye Near:    Bilateral Near:     Physical Exam  Constitutional: She is oriented to person, place, and time. She appears well-developed and well-nourished.  Pt lying on her Left side in darkened exam room, eyes closed.  Pt appears uncomfortable but is awake. Slowed responses but cooperative during exam.  HENT:  Head: Normocephalic and atraumatic.  Right Ear: Tympanic membrane normal.  Left Ear: Tympanic membrane normal.  Nose: Nose normal. Right sinus exhibits no maxillary sinus tenderness and no frontal sinus tenderness. Left sinus exhibits no maxillary sinus tenderness and no frontal sinus tenderness.  Mouth/Throat: Uvula is midline, oropharynx is clear and moist and mucous membranes are normal.  Eyes: Conjunctivae and EOM are normal. Pupils are equal, round, and reactive to light. Right eye exhibits no discharge. Left eye exhibits no discharge.  Neck: Neck supple.  Slight decreased head flexion. Limited head extension. Extremely limited head rotation (pt afraid of eliciting dizziness)   Cardiovascular: Regular rhythm. Tachycardia present.  Mildly tachycardic, regular rhythm  Pulmonary/Chest: Effort normal and breath sounds normal. No stridor. No respiratory distress. She has no wheezes. She has no rales.  Musculoskeletal: Normal range of motion.  Neurological: She is alert and oriented to person, place, and time.  Alert to person, place, time and events. Speech is clear. Slowed to move and open her eyes as movement elicits  dizziness.  Unable to ambulate w/o  assistance due to severe dizziness  Skin: Skin is warm and dry. Capillary refill takes less than 2 seconds. No rash noted. She is not diaphoretic.  Psychiatric: She has a normal mood and affect. Her behavior is normal.  Nursing note and vitals reviewed.    UC Treatments / Results  Labs (all labs ordered are listed, but only abnormal results are displayed) Labs Reviewed  POCT INFLUENZA A/B    EKG  EKG Interpretation None       Radiology No results found.  Procedures Procedures (including critical care time)  Medications Ordered in UC Medications  ondansetron (ZOFRAN-ODT) disintegrating tablet 8 mg (8 mg Oral Given 04/30/17 1825)     Initial Impression / Assessment and Plan / UC Course  I have reviewed the triage vital signs and the nursing notes.  Pertinent labs & imaging results that were available during my care of the patient were reviewed by me and considered in my medical decision making (see chart for details).    Flu test was negative but could be false negative.  No improvement of nausea after 8mg  Zofran ODT.  Limited movement of neck. Barely able to open her eyes for exam due to severe dizziness. Unable to ambulate w/o assistance due to severe dizziness.   Encouraged pt to go to ED for further evaluation and treatment of symptoms. Concern for meningitis, SAH, or other possible emergent process taking place at this time.  Attempted to call EMS for pt due to inability to walk unassisted. Pt and husband refused due to cost concern. Pt will be going POV with husband driving to [facility] for further evaluation and treatment.  Pt assisted out to the car via wheelchair.    Final Clinical Impressions(s) / UC Diagnoses   Final diagnoses:  Neck stiffness  Severe headache  Dizziness  Non-intractable cyclical vomiting with nausea    ED Discharge Orders    None       Controlled Substance Prescriptions [place] Controlled Substance Registry  consulted? Not Applicable  Lurene Shadow, [place] 05/01/17 [phone]

## 2017-05-01 ENCOUNTER — Telehealth: Payer: Self-pay

## 2017-05-01 NOTE — Telephone Encounter (Signed)
Left message to call the UC for follow up.  Follow up information given.

## 2017-05-13 ENCOUNTER — Other Ambulatory Visit: Payer: Self-pay | Admitting: Otolaryngology

## 2017-05-13 DIAGNOSIS — R519 Headache, unspecified: Secondary | ICD-10-CM

## 2017-05-13 DIAGNOSIS — R51 Headache: Principal | ICD-10-CM

## 2017-05-17 ENCOUNTER — Ambulatory Visit: Payer: 59 | Admitting: Neurology

## 2017-05-17 ENCOUNTER — Encounter: Payer: Self-pay | Admitting: Neurology

## 2017-05-17 ENCOUNTER — Telehealth: Payer: Self-pay | Admitting: Neurology

## 2017-05-17 DIAGNOSIS — G43019 Migraine without aura, intractable, without status migrainosus: Secondary | ICD-10-CM | POA: Insufficient documentation

## 2017-05-17 HISTORY — DX: Migraine without aura, intractable, without status migrainosus: G43.019

## 2017-05-17 MED ORDER — ONDANSETRON 4 MG PO TBDP: 4.0000 mg | ORAL_TABLET | Freq: Every day | ORAL | 1 refills | Status: DC | PRN

## 2017-05-17 MED ORDER — PREDNISONE 10 MG PO TABS: ORAL_TABLET | ORAL | 0 refills | Status: DC

## 2017-05-17 NOTE — Telephone Encounter (Signed)
Error

## 2017-05-17 NOTE — Progress Notes (Signed)
Reason for visit: Headache  Referring physician: Dr. Nils Flack is a 37 y.o. female  History of present illness:  Ms. Belmonte is a 37 year old left-handed white female with a history of onset of a headache that began over an hour and a half on 28 April 2017.  The patient was eating lunch and then gradually noted that she was having a mild headache and some nausea and light sensitivity.  Within an hour and a half the symptoms were fairly severe, the patient was developing neck stiffness and severe photophobia and phonophobia, nausea and vomiting.  The patient had dizziness and vertigo with the symptoms.  She laid in bed for 2 days, and then went to urgent care who sent her to the emergency room.  The patient underwent a CT scan of the brain that was unremarkable, no sinus disease was noted.  The patient was not running fevers.  The patient did undergo blood work revealing a normal white blood count and hemoglobin, chemistry profile was unremarkable.  The patient has had ongoing symptoms, she has headache in the frontal areas and in the back of the head and some neck stiffness.  She feels chills but no fevers.  She continues to have dizziness, ringing in the ears, photophobia and phonophobia.  She feels somewhat off balance, she has not sustained any falls, she denies weakness or numbness of the extremities.  She denies any problems controlling the bowels or the bladder.  She was seen by Dr. Gean Quint who set her up for MRI evaluation of the brain with and without gadolinium, but the patient does not believe that she can afford the $300 co-pay for the scan.  She comes to this office for further evaluation.  Past Medical History:  Diagnosis Date  . Arthritis   . Common migraine with intractable migraine 05/17/2017  . Knee problem    rt knee  . LGSIL (low grade squamous intraepithelial dysplasia) 09/2013, 09/2014   Positive high risk HPV screen 2015, negative high risk HPV 2016  . PCOS  (polycystic ovarian syndrome)   . PONV (postoperative nausea and vomiting)   . VAIN I (vaginal intraepithelial neoplasia grade I) 07/2011   at hymenal ring 4 oclock    Past Surgical History:  Procedure Laterality Date  . CERVICAL BIOPSY  W/ LOOP ELECTRODE EXCISION  11/01/2013   HGSIL on cervical biopsy, LGSIL on LEEP specimen with endocervical margin involvement  . CESAREAN SECTION  12-03-2001  &  03-24-2003   LAST ONE W/ TUBAL LIGATION  . FOOT SURGERY  2000   left foot, screw inserted  . INTRAUTERINE DEVICE INSERTION  01/01/2015   Mirena  . KNEE ARTHROSCOPY  12/16/2011   Procedure: ARTHROSCOPY KNEE;  Surgeon: Javier Docker, MD;  Location: [facility];  Service: Orthopedics;  Laterality: Right;  Right Knee Arthroscopy with Excision of Tibial Exostosis, debridement right knee medial menisectomy  . KNEE ARTHROSCOPY  03/21/2012   Procedure: ARTHROSCOPY KNEE;  Surgeon: Eugenia Mcalpine, MD;  Location: [facility];  Service: Orthopedics;  Laterality: Right;  right chondroplasty  . KNEE ARTHROSCOPY W/ MENISCECTOMY  07-08-2011  DR [facility]   RIGHT KNEE  . KNEE SURGERY    . LAPAROSCOPIC CHOLECYSTECTOMY  12-22-2008  . MEDIAL COLLATERAL LIGAMENT REPAIR, KNEE  03/21/2012   Procedure: RECONSTRUCTION MEDIAL COLLATERAL LIGAMENT (MCL);  Surgeon: Eugenia Mcalpine, MD;  Location: [facility];  Service: Orthopedics;  Laterality: Right;  achilles tendon allograft  . ROUX-EN-Y GASTRIC BYPASS  01-22-2008  LAPAROSCOPIC  . TONSILLECTOMY AND ADENOIDECTOMY  1995  . TUBAL LIGATION    . WISDOM TOOTH EXTRACTION  2001    Family History  Problem Relation Age of Onset  . Diabetes Father   . Hypertension Father   . Cancer Maternal Grandmother        COLON CANCER  . Diabetes Paternal Grandfather     Social history:  reports that  has never smoked. she has never used smokeless tobacco. She reports that she does not drink alcohol or use drugs.  Medications:  Prior to Admission medications   Medication Sig Start Date End Date  Taking? Authorizing Provider  amphetamine-dextroamphetamine (ADDERALL) 20 MG tablet Take 20 mg by mouth 2 (two) times daily.   Yes [provider]  calcium citrate-vitamin D (CITRACAL+D) 315-200 MG-UNIT per tablet Take 2 tablets by mouth 2 (two) times daily.   Yes [provider]  Cyanocobalamin (B-12 PO) Place 1 tablet under the tongue daily.    Yes [provider]  EPINEPHrine (EPI-PEN) 0.3 mg/0.3 mL DEVI Inject 0.3 mg into the muscle once as needed. reaction   Yes [provider]  gabapentin (NEURONTIN) 300 MG capsule Take 600 mg by mouth 3 (three) times daily. 07/08/16  Yes [provider]  ibuprofen (ADVIL,MOTRIN) 800 MG tablet Take 1 tablet (800 mg total) by mouth 3 (three) times daily. 02/29/16  Yes Fayrene Helper, PA-C  levonorgestrel (MIRENA) 20 MCG/24HR IUD 1 each by Intrauterine route once.   Yes [provider]  meclizine (ANTIVERT) 25 MG tablet Take 1 tablet by mouth 3 (three) times daily as needed. 05/02/17  Yes [provider]  ondansetron (ZOFRAN-ODT) 4 MG disintegrating tablet Take 1 tablet (4 mg total) by mouth daily as needed. 05/17/17  Yes York Spaniel, MD  predniSONE (DELTASONE) 10 MG tablet Begin taking 6 tablets daily, taper by one tablet daily until off the medication. 05/17/17   York Spaniel, MD      Allergies  Allergen Reactions  . Codeine Nausea And Vomiting  . Shrimp [Shellfish Allergy] Anaphylaxis    ROS:  Out of a complete 14 system review of symptoms, the patient complains only of the following symptoms, and all other reviewed systems are negative.  Fevers, chills Ringing in the ears, dizziness Eye pain Anemia Feeling cold Confusion, headache, dizziness Depression, anxiety, decreased energy, disinterest in activities  Blood pressure 119/79, pulse 73, height 5\' 2"  (1.575 m), weight 193 lb 8 oz (87.8 kg).  Physical Exam  General: The patient is alert and cooperative at the time of the  examination.  The patient is obese.  Eyes: Pupils are equal, round, and reactive to light. Discs are flat bilaterally.  Neck: The neck is supple, no carotid bruits are noted.  Respiratory: The respiratory examination is clear.  Cardiovascular: The cardiovascular examination reveals a regular rate and rhythm, no obvious murmurs or rubs are noted.  Skin: Extremities are without significant edema.  Neurologic Exam  Mental status: The patient is alert and oriented x 3 at the time of the examination. The patient has apparent normal recent and remote memory, with an apparently normal attention span and concentration ability.  Cranial nerves: Facial symmetry is present. There is good sensation of the face to pinprick and soft touch bilaterally. The strength of the facial muscles and the muscles to head turning and shoulder shrug are normal bilaterally. Speech is well enunciated, no aphasia or dysarthria is noted. Extraocular movements are full. Visual fields are full. The  tongue is midline, and the patient has symmetric elevation of the soft palate. No obvious hearing deficits are noted.  Motor: The motor testing reveals 5 over 5 strength of all 4 extremities. Good symmetric motor tone is noted throughout.  Sensory: Sensory testing is intact to pinprick, soft touch, vibration sensation, and position sense on all 4 extremities. No evidence of extinction is noted.  Coordination: Cerebellar testing reveals good finger-nose-finger and heel-to-shin bilaterally.  Gait and station: Gait is normal. Tandem gait is normal. Romberg is negative. No drift is seen.  Reflexes: Deep tendon reflexes are symmetric and normal bilaterally. Toes are downgoing bilaterally.   Assessment/Plan:  1.  Probable converted migraine  The patient denies any prior history of migraine headaches or any family history of headaches, but the current pain syndrome is consistent with this.  The patient had a gradual onset of the  headache over an hour and half, CT scan of the brain done through the emergency room was unremarkable, this was done without contrast.  The patient has been set up for MRI of the brain, she does not believe that she can afford the co-pay for the study.  We will give the patient a trial on a prednisone Dosepak 10 mg 6-day course, she is to call our office if this is not helpful for the headache, we will initiate a daily medication regimen.  A prescription was given for the Zofran as well.  She will follow-up in 3-4 months.  Marlan Palau. Keith Jeaneen Cala MD 05/17/2017 2:25 PM  [facility] [address] [place], [place] [id number]  Phone [phone] Fax [phone]

## 2017-05-17 NOTE — Patient Instructions (Signed)
   We will go on a prednisone dose pack for 6 days, call if the headache does not improve

## 2017-05-19 ENCOUNTER — Other Ambulatory Visit: Payer: 59

## 2017-05-22 ENCOUNTER — Telehealth: Payer: Self-pay | Admitting: Neurology

## 2017-05-22 NOTE — Telephone Encounter (Signed)
Pt called stating that predniSONE (DELTASONE) 10 MG tablet hasnt been working. Stating yesterday she got very dizzy and threw up. Pt is requesting a call back to discuss next option

## 2017-06-06 NOTE — Telephone Encounter (Signed)
I called the patient, left a message, I will call back later. 

## 2017-06-06 NOTE — Telephone Encounter (Signed)
I called the patient again, left a message again.  I will call tomorrow morning.

## 2017-06-07 MED ORDER — TOPIRAMATE 25 MG PO TABS: ORAL_TABLET | ORAL | 3 refills | Status: DC

## 2017-06-07 NOTE — Telephone Encounter (Signed)
Pt returning call stating th best time to reach her is 12-1 when she is at lunch, 3:30 she goes on break and once shes off at 5. Pt apologies for not being abe to get to her phone.

## 2017-06-07 NOTE — Telephone Encounter (Signed)
I called the patient.  The patient is having ongoing headaches.  She is very sensitive to odors, a certain odors may activate the headache immediately.  She will be started on Topamax at this time.  She will call for any dose adjustments.

## 2017-06-07 NOTE — Addendum Note (Signed)
Addended by: York SpanielWILLIS, CHARLES K on: 06/07/2017 06:08 PM   Modules accepted: Orders

## 2017-06-07 NOTE — Telephone Encounter (Signed)
I called the patient again a third time, left a message.  If the patient is having ongoing problems with headaches, she may need to be on a medication that she takes daily to help prevent the headache.  She will call our office back if this is a concern.

## 2017-08-10 ENCOUNTER — Encounter (HOSPITAL_COMMUNITY): Payer: Self-pay

## 2017-08-14 ENCOUNTER — Other Ambulatory Visit: Payer: Self-pay | Admitting: Gynecology

## 2017-10-03 ENCOUNTER — Ambulatory Visit (INDEPENDENT_AMBULATORY_CARE_PROVIDER_SITE_OTHER): Payer: 59 | Admitting: Gynecology

## 2017-10-03 ENCOUNTER — Encounter: Payer: Self-pay | Admitting: Gynecology

## 2017-10-03 VITALS — BP 118/76 | Ht 62.0 in | Wt 184.0 lb

## 2017-10-03 DIAGNOSIS — Z13220 Encounter for screening for lipoid disorders: Secondary | ICD-10-CM

## 2017-10-03 DIAGNOSIS — L680 Hirsutism: Secondary | ICD-10-CM | POA: Diagnosis not present

## 2017-10-03 DIAGNOSIS — D500 Iron deficiency anemia secondary to blood loss (chronic): Secondary | ICD-10-CM

## 2017-10-03 DIAGNOSIS — Z30431 Encounter for routine checking of intrauterine contraceptive device: Secondary | ICD-10-CM

## 2017-10-03 DIAGNOSIS — N898 Other specified noninflammatory disorders of vagina: Secondary | ICD-10-CM | POA: Diagnosis not present

## 2017-10-03 DIAGNOSIS — F410 Panic disorder [episodic paroxysmal anxiety] without agoraphobia: Secondary | ICD-10-CM

## 2017-10-03 DIAGNOSIS — Z01411 Encounter for gynecological examination (general) (routine) with abnormal findings: Secondary | ICD-10-CM

## 2017-10-03 LAB — WET PREP FOR TRICH, YEAST, CLUE

## 2017-10-03 MED ORDER — DROSPIRENONE-ETHINYL ESTRADIOL 3-0.02 MG PO TABS: 1.0000 | ORAL_TABLET | Freq: Every day | ORAL | 12 refills | Status: DC

## 2017-10-03 NOTE — Patient Instructions (Signed)
Office will call you with the culture results.  Follow-up in 1 year for annual exam.

## 2017-10-03 NOTE — Addendum Note (Signed)
Addended by: Dayna BarkerGARDNER, Erbie Arment K on: 10/03/2017 11:49 AM   Modules accepted: Orders

## 2017-10-03 NOTE — Progress Notes (Signed)
Gabriella Sawyer 08-19-1980 [id number]        37 y.o.  [id number] for annual gynecologic exam.  Notes some vaginal irritation of the last week or so.  Has noticed a discharge that comes and goes for months.  No odor.  No urinary symptoms such as frequency dysuria urgency low back pain fever or chills.  Currently being evaluated for panic attacks  Past medical history,surgical history, problem list, medications, allergies, family history and social history were all reviewed and documented as reviewed in the EPIC chart.  ROS:  Performed with pertinent positives and negatives included in the history, assessment and plan.   Additional significant findings : None   Exam: Kennon PortelaKim Gardner assistant Vitals:   10/03/17 1020  BP: 118/76  Weight: 184 lb (83.5 kg)  Height: 5\' 2"  (1.575 m)   Body mass index is 33.65 kg/m.  General appearance:  Normal affect, orientation and appearance. Skin: Grossly normal HEENT: Without gross lesions.  No cervical or supraclavicular adenopathy. Thyroid normal.  Lungs:  Clear without wheezing, rales or rhonchi Cardiac: RR, without RMG Abdominal:  Soft, nontender, without masses, guarding, rebound, organomegaly or hernia Breasts:  Examined lying and sitting without masses, retractions, discharge or axillary adenopathy. Pelvic:  Ext, BUS, Vagina: With irritative area upper inner left labia minora  Cervix: Normal.  IUD string visualized.  Pap smear done.  Uterus: Anteverted, normal size, shape and contour, midline and mobile nontender   Adnexa: Without masses or tenderness    Anus and perineum: Normal   Rectovaginal: Normal sphincter tone without palpated masses or tenderness.    Assessment/Plan:  37 y.o. [id number] female for annual gynecologic exam without menses, Mirena IUD, tubal sterilization with last C-section.   1. Irritated area upper inner left labia minora.  HSV screen done.  Not classic in appearance.  Wet prep was negative for yeast or bacterial  vaginosis.  Patient will follow-up on the HSV screen.  If positive then options to include daily suppressive therapy for the next 6 months or so versus intermittent outbreak therapy reviewed.  We also discussed the possibilities of subclinical exposure in the past with outbreaks now and that this does not necessarily indicate recent exposure.  Other possibilities such as trauma to the area discussed.  Patient will monitor for now and follow-up for HSV results. 2. History of PCOS/menorrhagia/hirsutism.  Continues on Yaz for hirsutism suppression.  Has Mirena IUD for menstrual suppression placed 2016.  Doing well with this combination.  Would like to continue.  We have discussed the risks to include thrombosis.  She is never smoked and not being followed for any major medical issues.  Refill x1 year provided. 3. Breast health.  Breast exam normal today.  We will plan baseline mammogram at age 37.  No strong family history of breast cancer. 4. Pap smear done today.  History of LGSIL with positive high-risk HPV screen 2015 with colposcopic biopsy showing HGSIL. LEEP performed 10/2013 with LGSIL positive endocervical margin. ECC was negative. Pap smear/HPV 09/2014 showed LGSIL with negative high-risk HPV. She subsequently had an adequate negative colposcopy with negative ECC 10/2014.  Pap smear/HPV 2017 negative.  Pap smear 2018 negative. 5. Health maintenance.  Baseline CBC, comprehensive metabolic panel, lipid profile, hemoglobin A1c, TSH given recent panic attack history and urine analysis ordered.  Discussed her issues of panic attacks and and she already has an appointment made with a psychiatrist and I encouraged her to follow-up for this..  Follow-up in 1 year for  annual gynecologic exam.   Dara Lords MD, 10:44 AM 10/03/2017

## 2017-10-04 ENCOUNTER — Encounter: Payer: 59 | Admitting: Gynecology

## 2017-10-04 ENCOUNTER — Other Ambulatory Visit: Payer: Self-pay | Admitting: Gynecology

## 2017-10-04 DIAGNOSIS — R7989 Other specified abnormal findings of blood chemistry: Secondary | ICD-10-CM

## 2017-10-04 LAB — CBC WITH DIFFERENTIAL/PLATELET
BASOS PCT: 0.9 %
Basophils Absolute: 68 cells/uL (ref 0–200)
Eosinophils Absolute: 188 cells/uL (ref 15–500)
Eosinophils Relative: 2.5 %
HEMATOCRIT: 42.4 % (ref 35.0–45.0)
Hemoglobin: 14.1 g/dL (ref 11.7–15.5)
Lymphs Abs: 2708 cells/uL (ref 850–3900)
MCH: 32.2 pg (ref 27.0–33.0)
MCHC: 33.3 g/dL (ref 32.0–36.0)
MCV: 96.8 fL (ref 80.0–100.0)
MPV: 11.1 fL (ref 7.5–12.5)
Monocytes Relative: 5.8 %
NEUTROS ABS: 4103 {cells}/uL (ref 1500–7800)
Neutrophils Relative %: 54.7 %
PLATELETS: 347 10*3/uL (ref 140–400)
RBC: 4.38 10*6/uL (ref 3.80–5.10)
RDW: 12.8 % (ref 11.0–15.0)
TOTAL LYMPHOCYTE: 36.1 %
WBC: 7.5 10*3/uL (ref 3.8–10.8)
WBCMIX: 435 {cells}/uL (ref 200–950)

## 2017-10-04 LAB — URINALYSIS, COMPLETE W/RFL CULTURE
BACTERIA UA: NONE SEEN /HPF
Bilirubin Urine: NEGATIVE
GLUCOSE, UA: NEGATIVE
HYALINE CAST: NONE SEEN /LPF
Hgb urine dipstick: NEGATIVE
Ketones, ur: NEGATIVE
Leukocyte Esterase: NEGATIVE
Nitrites, Initial: NEGATIVE
PH: 6.5 (ref 5.0–8.0)
Protein, ur: NEGATIVE
RBC / HPF: NONE SEEN /HPF (ref 0–2)
SPECIFIC GRAVITY, URINE: 1.013 (ref 1.001–1.03)
Squamous Epithelial / LPF: NONE SEEN /HPF (ref <=5)
WBC, UA: NONE SEEN /HPF (ref 0–5)

## 2017-10-04 LAB — COMPREHENSIVE METABOLIC PANEL
AG Ratio: 1.5 (calc) (ref 1.0–2.5)
ALBUMIN MSPROF: 4 g/dL (ref 3.6–5.1)
ALT: 12 U/L (ref 6–29)
AST: 16 U/L (ref 10–30)
Alkaline phosphatase (APISO): 72 U/L (ref 33–115)
BILIRUBIN TOTAL: 0.3 mg/dL (ref 0.2–1.2)
BUN/Creatinine Ratio: 11 (calc) (ref 6–22)
BUN: 13 mg/dL (ref 7–25)
CO2: 20 mmol/L (ref 20–32)
CREATININE: 1.2 mg/dL — AB (ref 0.50–1.10)
Calcium: 9.6 mg/dL (ref 8.6–10.2)
Chloride: 109 mmol/L (ref 98–110)
Globulin: 2.6 g/dL (calc) (ref 1.9–3.7)
Glucose, Bld: 93 mg/dL (ref 65–99)
POTASSIUM: 5 mmol/L (ref 3.5–5.3)
SODIUM: 142 mmol/L (ref 135–146)
TOTAL PROTEIN: 6.6 g/dL (ref 6.1–8.1)

## 2017-10-04 LAB — NO CULTURE INDICATED

## 2017-10-04 LAB — HEMOGLOBIN A1C
EAG (MMOL/L): 5 (calc)
Hgb A1c MFr Bld: 4.8 % of total Hgb (ref <5.7)
MEAN PLASMA GLUCOSE: 91 (calc)

## 2017-10-04 LAB — PAP IG W/ RFLX HPV ASCU

## 2017-10-04 LAB — LIPID PANEL
CHOL/HDL RATIO: 2.3 (calc) (ref <5.0)
Cholesterol: 151 mg/dL (ref <200)
HDL: 65 mg/dL (ref >50)
LDL Cholesterol (Calc): 63 mg/dL (calc)
NON-HDL CHOLESTEROL (CALC): 86 mg/dL (ref <130)
TRIGLYCERIDES: 149 mg/dL (ref <150)

## 2017-10-04 LAB — TSH: TSH: 1.16 mIU/L

## 2017-10-04 MED ORDER — FLUCONAZOLE 150 MG PO TABS: 150.0000 mg | ORAL_TABLET | Freq: Once | ORAL | 0 refills | Status: AC

## 2017-10-04 NOTE — Telephone Encounter (Signed)
Hemoglobin level was 14 which is in the normal range between 11-15.  Hematocrit runs in the 35-45 range and may be that is what she is thinking about which is a different measurement.  Her transferrin and ferritin levels were low a year ago when her hemoglobin was 9 consistent with iron deficiency anemia.  She has corrected her hemoglobin which indicates that her iron levels are keeping up.

## 2017-10-06 ENCOUNTER — Encounter: Payer: 59 | Admitting: Gynecology

## 2017-10-07 LAB — SURESWAB HSV, TYPE 1/2 DNA, PCR
HSV 1 DNA: NOT DETECTED
HSV 2 DNA: NOT DETECTED

## 2017-11-15 ENCOUNTER — Other Ambulatory Visit: Payer: 59

## 2017-11-15 DIAGNOSIS — R7989 Other specified abnormal findings of blood chemistry: Secondary | ICD-10-CM

## 2017-11-16 LAB — BUN: BUN: 7 mg/dL (ref 7–25)

## 2017-11-16 LAB — CREATININE, SERUM: Creat: 0.87 mg/dL (ref 0.50–1.10)

## 2017-11-17 ENCOUNTER — Encounter: Payer: Self-pay | Admitting: Gynecology

## 2017-11-27 ENCOUNTER — Encounter: Payer: Self-pay | Admitting: Neurology

## 2017-11-27 ENCOUNTER — Telehealth: Payer: Self-pay | Admitting: Neurology

## 2017-11-27 ENCOUNTER — Ambulatory Visit: Payer: 59 | Admitting: Neurology

## 2017-11-27 NOTE — Telephone Encounter (Signed)
This patient did not show for a revisit appointment today. 

## 2017-12-18 ENCOUNTER — Other Ambulatory Visit: Payer: Self-pay | Admitting: Neurology

## 2018-01-10 ENCOUNTER — Other Ambulatory Visit: Payer: Self-pay | Admitting: Neurology

## 2018-07-20 ENCOUNTER — Other Ambulatory Visit: Payer: Self-pay | Admitting: Family Medicine

## 2018-07-20 ENCOUNTER — Ambulatory Visit: Payer: Self-pay

## 2018-07-20 ENCOUNTER — Other Ambulatory Visit: Payer: Self-pay

## 2018-07-20 DIAGNOSIS — M25561 Pain in right knee: Secondary | ICD-10-CM

## 2018-07-30 ENCOUNTER — Other Ambulatory Visit: Payer: Self-pay

## 2018-07-30 ENCOUNTER — Encounter: Payer: Self-pay | Admitting: Emergency Medicine

## 2018-07-30 ENCOUNTER — Ambulatory Visit
Admission: EM | Admit: 2018-07-30 | Discharge: 2018-07-30 | Disposition: A | Discharge status: Home / Self Care | Payer: 59 | Attending: Family Medicine | Admitting: Family Medicine

## 2018-07-30 ENCOUNTER — Ambulatory Visit (INDEPENDENT_AMBULATORY_CARE_PROVIDER_SITE_OTHER): Payer: 59

## 2018-07-30 DIAGNOSIS — R05 Cough: Secondary | ICD-10-CM | POA: Diagnosis not present

## 2018-07-30 DIAGNOSIS — R059 Cough, unspecified: Secondary | ICD-10-CM

## 2018-07-30 DIAGNOSIS — R51 Headache: Secondary | ICD-10-CM

## 2018-07-30 MED ORDER — HYDROCODONE-HOMATROPINE 5-1.5 MG/5ML PO SYRP: 5.0000 mL | ORAL_SOLUTION | Freq: Four times a day (QID) | ORAL | 0 refills | Status: DC | PRN

## 2018-07-30 MED ORDER — BENZONATATE 100 MG PO CAPS: 100.0000 mg | ORAL_CAPSULE | Freq: Three times a day (TID) | ORAL | 0 refills | Status: DC

## 2018-07-30 MED ORDER — KETOROLAC TROMETHAMINE 30 MG/ML IJ SOLN
30.0000 mg | Freq: Once | INTRAMUSCULAR | Status: AC
  Administered 2018-07-30: 11:00:00 30 mg via INTRAMUSCULAR

## 2018-07-30 MED ORDER — ALBUTEROL SULFATE HFA 108 (90 BASE) MCG/ACT IN AERS: 1.0000 | INHALATION_SPRAY | Freq: Four times a day (QID) | RESPIRATORY_TRACT | 0 refills | Status: DC | PRN

## 2018-07-30 MED ORDER — PREDNISONE 10 MG PO TABS: 40.0000 mg | ORAL_TABLET | Freq: Every day | ORAL | 0 refills | Status: AC

## 2018-07-30 NOTE — Discharge Instructions (Addendum)
Your chest x-ray was normal We will try another burst of steroids And refill the Hycodan cough syrup You can take the Tessalon Perles during the day for cough to avoid drowsiness Continue taking the daily allergy medicine Continue using the humidifier You can use the albuterol inhaler every 6 hours as needed for cough, wheezing, shortness of breath If your symptoms do not improve in the next week or worsen with this treatment you need to follow-up with your doctor

## 2018-07-30 NOTE — ED Notes (Signed)
Patient able to ambulate independently  

## 2018-07-30 NOTE — ED Triage Notes (Signed)
Pt presents to [facility] for assessment of cough, congestion.  Pt states she has had bloody mucous production.  Pt states she has been working with her PCP over the phone, has tried an antibiotic, prednisone and tessalon pearls.  Pt states symptoms started in march, and is not improving.

## 2018-07-30 NOTE — ED Provider Notes (Addendum)
[name]    CSN: [id number] Arrival date & time: 07/30/18  1010     History   Chief Complaint Chief Complaint  Patient presents with  . Cough    HPI Gabriella Sawyer is a 38 y.o. female.   Patient is a 38 year old female presents today with persistent cough that has been waxing and waning over the past month.  The cough is productive with blood-tinged sputum.  She has also had some associated nasal congestion, rhinorrhea and ear discomfort.  Reports the cough is worse at night.  Over the past month she has been treated with amoxicillin, cefdinir, prednisone, Tessalon Perles and Hycodan without much relief of her symptoms.  She is still having a productive cough with thick mucus.  Denies any associated fevers.  She is having some chest discomfort and back discomfort with breathing and coughing.  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  She has not been around any sick contacts that she knows of.  No known COVID exposure.  Denies any shortness of breath.   ROS per HPI      Past Medical History:  Diagnosis Date  . Arthritis   . Common migraine with intractable migraine 05/17/2017  . LGSIL (low grade squamous intraepithelial dysplasia) 09/2013, 09/2014   Positive high risk HPV screen 2015, negative high risk HPV 2016  . Panic attacks   . PCOS (polycystic ovarian syndrome)   . PONV (postoperative nausea and vomiting)     Patient Active Problem List   Diagnosis Date Noted  . Common migraine with intractable migraine 05/17/2017  . Major depressive disorder, single episode, moderate (HCC) 07/21/2016  . VAIN I (vaginal intraepithelial neoplasia grade I) 07/29/2011  . PCOS (polycystic ovarian syndrome)     Past Surgical History:  Procedure Laterality Date  . CERVICAL BIOPSY  W/ LOOP ELECTRODE EXCISION  11/01/2013   HGSIL on cervical biopsy, LGSIL on LEEP specimen with endocervical margin involvement  . CESAREAN SECTION   12-03-2001  &  03-24-2003   LAST ONE W/ TUBAL LIGATION  . FOOT SURGERY  2000   left foot, screw inserted  . INTRAUTERINE DEVICE INSERTION  01/01/2015   Mirena  . KNEE ARTHROSCOPY  12/16/2011   Procedure: ARTHROSCOPY KNEE;  Surgeon: Javier Docker, MD;  Location: [facility];  Service: Orthopedics;  Laterality: Right;  Right Knee Arthroscopy with Excision of Tibial Exostosis, debridement right knee medial menisectomy  . KNEE ARTHROSCOPY  03/21/2012   Procedure: ARTHROSCOPY KNEE;  Surgeon: Eugenia Mcalpine, MD;  Location: [facility];  Service: Orthopedics;  Laterality: Right;  right chondroplasty  . KNEE ARTHROSCOPY W/ MENISCECTOMY  07-08-2011  DR [facility]   RIGHT KNEE  . KNEE SURGERY    . LAPAROSCOPIC CHOLECYSTECTOMY  12-22-2008  . MEDIAL COLLATERAL LIGAMENT REPAIR, KNEE  03/21/2012   Procedure: RECONSTRUCTION MEDIAL COLLATERAL LIGAMENT (MCL);  Surgeon: Eugenia Mcalpine, MD;  Location: [facility];  Service: Orthopedics;  Laterality: Right;  achilles tendon allograft  . ROUX-EN-Y GASTRIC BYPASS  01-22-2008   LAPAROSCOPIC  . TONSILLECTOMY AND ADENOIDECTOMY  1995  . TUBAL LIGATION    . WISDOM TOOTH EXTRACTION  2001    OB History    Gravida  2   Para  2   Term  2   Preterm      AB      Living  2     SAB      TAB      Ectopic  Multiple      Live Births               Home Medications    Prior to Admission medications   Medication Sig Start Date End Date Taking? Authorizing Provider  albuterol (VENTOLIN HFA) 108 (90 Base) MCG/ACT inhaler Inhale 1-2 puffs into the lungs every 6 (six) hours as needed for wheezing or shortness of breath. 07/30/18   Saleema Weppler A, NP  ALPRAZolam (XANAX) 1 MG tablet Take 1 mg by mouth at bedtime as needed for anxiety.    [provider]  amphetamine-dextroamphetamine (ADDERALL) 20 MG tablet Take 20 mg by mouth 2 (two) times daily.    [provider]  benzonatate (TESSALON) 100 MG capsule Take 1 capsule (100 mg total) by mouth every 8  (eight) hours. 07/30/18   Dahlia ByesBast, Xianna Siverling A, NP  calcium citrate-vitamin D (CITRACAL+D) 315-200 MG-UNIT per tablet Take 2 tablets by mouth 2 (two) times daily.    [provider]  Cyanocobalamin (B-12 PO) Place 1 tablet under the tongue daily.     [provider]  drospirenone-ethinyl estradiol (NIKKI) 3-0.02 MG tablet Take 1 tablet by mouth daily. 10/03/17   Fontaine, Nadyne Coombesimothy P, MD  EPINEPHrine (EPI-PEN) 0.3 mg/0.3 mL DEVI Inject 0.3 mg into the muscle once as needed. reaction    [provider]  gabapentin (NEURONTIN) 300 MG capsule Take 600 mg by mouth 3 (three) times daily. 07/08/16   [provider]  HYDROcodone-homatropine (HYCODAN) 5-1.5 MG/5ML syrup Take 5 mLs by mouth every 6 (six) hours as needed for cough. 07/30/18   Janace ArisBast, Reagen Goates A, NP  levonorgestrel (MIRENA) 20 MCG/24HR IUD 1 each by Intrauterine route once.    [provider]  ondansetron (ZOFRAN-ODT) 4 MG disintegrating tablet Take 1 tablet (4 mg total) by mouth daily as needed. 05/17/17   York SpanielWillis, Charles K, MD  PARoxetine (PAXIL) 20 MG tablet Take 20 mg by mouth daily.    [provider]  predniSONE (DELTASONE) 10 MG tablet Take 4 tablets (40 mg total) by mouth daily for 5 days. 07/30/18 08/04/18  Dahlia ByesBast, Perris Conwell A, NP  topiramate (TOPAMAX) 25 MG tablet Take one tablet at night for one week, then take 2 tablets at night for one week, then take 3 tablets at night. 06/07/17   York SpanielWillis, Charles K, MD    Family History Family History  Problem Relation Age of Onset  . Diabetes Father   . Hypertension Father   . Cancer Maternal Grandmother        COLON CANCER  . Diabetes Paternal Grandfather     Social History Social History   Tobacco Use  . Smoking status: Never Smoker  . Smokeless tobacco: Never Used  Substance Use Topics  . Alcohol use: No    Alcohol/week: 0.0 standard drinks  . Drug use: No     Allergies   Codeine and Shrimp [shellfish allergy]   Review of Systems Review of  Systems  Constitutional: Positive for activity change and fatigue.  HENT: Positive for congestion, mouth sores, postnasal drip and rhinorrhea.   Respiratory: Positive for cough and wheezing.   Cardiovascular: Positive for chest pain.  Musculoskeletal: Positive for myalgias.     Physical Exam Triage Vital Signs ED Triage Vitals  Enc Vitals Group     BP 07/30/18 1020 (!) 130/92     Pulse Rate 07/30/18 1020 68     Resp 07/30/18 1020 (!) 22     Temp 07/30/18 1020 98.3 F (36.8  C)     Temp Source 07/30/18 1020 Oral     SpO2 07/30/18 1020 99 %     Weight --      Height --      Head Circumference --      Peak Flow --      Pain Score 07/30/18 1021 6     Pain Loc --      Pain Edu? --      Excl. in GC? --    No data found.  Updated Vital Signs BP (!) 130/92 (BP Location: Left Arm)   Pulse 68   Temp 98.3 F (36.8 C) (Oral)   Resp (!) 22   SpO2 99%   Visual Acuity Right Eye Distance:   Left Eye Distance:   Bilateral Distance:    Right Eye Near:   Left Eye Near:    Bilateral Near:     Physical Exam Vitals signs and nursing note reviewed.  Constitutional:      Appearance: She is ill-appearing.  HENT:     Head: Normocephalic and atraumatic.     Right Ear: Tympanic membrane and ear canal normal.     Left Ear: Tympanic membrane and ear canal normal.     Nose: Congestion and rhinorrhea present.     Mouth/Throat:     Pharynx: Oropharynx is clear.  Eyes:     Conjunctiva/sclera: Conjunctivae normal.  Neck:     Musculoskeletal: Normal range of motion.  Cardiovascular:     Rate and Rhythm: Normal rate and regular rhythm.     Pulses: Normal pulses.     Heart sounds: Normal heart sounds.  Pulmonary:     Effort: Pulmonary effort is normal.     Comments: Coarse lung sounds in bases of lungs Musculoskeletal: Normal range of motion.  Skin:    General: Skin is warm and dry.  Psychiatric:        Mood and Affect: Mood normal.      UC Treatments / Results  Labs (all  labs ordered are listed, but only abnormal results are displayed) Labs Reviewed - No data to display  EKG None  Radiology Dg Chest 2 View  Result Date: 07/30/2018 CLINICAL DATA:  Cough and congestion.  No improvement for 1 month. EXAM: CHEST - 2 VIEW COMPARISON:  None. FINDINGS: The heart size and mediastinal contours are within normal limits. Both lungs are clear. The visualized skeletal structures are unremarkable. IMPRESSION: Negative two view chest x-ray Electronically Signed   By: Marin Roberts M.D.   On: 07/30/2018 10:55    Procedures Procedures (including critical care time)  Medications Ordered in UC Medications  ketorolac (TORADOL) 30 MG/ML injection 30 mg (30 mg Intramuscular Given 07/30/18 1111)    Initial Impression / Assessment and Plan / UC Course  I have reviewed the triage vital signs and the nursing notes.  Pertinent labs & imaging results that were available during my care of the patient were reviewed by me and considered in my medical decision making (see chart for details).    Cough   Chest x-ray was normal Most likely lung inflammation due to viral illness We will treat with burst of steroids Tessalon Perles and Hycodan for cough Albuterol to use as needed every 6 hours for cough, wheezing, shortness of breath Toradol injection given here for headache Continue the allergy medications.  Instructed that if her symptoms do not improve or worsen over the next week she will need to follow-up with her doctor for  further management Final Clinical Impressions(s) / UC Diagnoses   Final diagnoses:  Cough     Discharge Instructions     Your chest x-ray was normal We will try another burst of steroids And refill the Hycodan cough syrup You can take the Tessalon Perles during the day for cough to avoid drowsiness Continue taking the daily allergy medicine Continue using the humidifier You can use the albuterol inhaler every 6 hours as needed for  cough, wheezing, shortness of breath If your symptoms do not improve in the next week or worsen with this treatment you need to follow-up with your doctor     ED Prescriptions    Medication Sig Dispense Auth. Provider   predniSONE (DELTASONE) 10 MG tablet Take 4 tablets (40 mg total) by mouth daily for 5 days. 20 tablet Aalia Greulich A, NP   benzonatate (TESSALON) 100 MG capsule Take 1 capsule (100 mg total) by mouth every 8 (eight) hours. 21 capsule Dequane Strahan A, NP   HYDROcodone-homatropine (HYCODAN) 5-1.5 MG/5ML syrup  (Status: Discontinued) Take 5 mLs by mouth every 6 (six) hours as needed for cough. 120 mL Delvonte Berenson A, NP   albuterol (VENTOLIN HFA) 108 (90 Base) MCG/ACT inhaler Inhale 1-2 puffs into the lungs every 6 (six) hours as needed for wheezing or shortness of breath. 1 Inhaler Deloise Marchant A, NP   HYDROcodone-homatropine (HYCODAN) 5-1.5 MG/5ML syrup Take 5 mLs by mouth every 6 (six) hours as needed for cough. 120 mL Dahlia Byes A, NP     Controlled Substance Prescriptions [place] Controlled Substance Registry consulted? Not Applicable        Janace Aris, NP 07/30/18 1222

## 2018-09-01 ENCOUNTER — Other Ambulatory Visit: Payer: Self-pay | Admitting: Gynecology

## 2018-09-26 ENCOUNTER — Other Ambulatory Visit: Payer: Self-pay | Admitting: Gynecology

## 2018-10-08 ENCOUNTER — Encounter: Payer: 59 | Admitting: Gynecology

## 2018-10-08 DIAGNOSIS — Z0289 Encounter for other administrative examinations: Secondary | ICD-10-CM

## 2018-10-23 ENCOUNTER — Other Ambulatory Visit: Payer: Self-pay | Admitting: Gynecology

## 2018-10-23 ENCOUNTER — Other Ambulatory Visit: Payer: Self-pay

## 2018-10-23 NOTE — Telephone Encounter (Signed)
CE scheduled 10/24/18. 

## 2018-10-24 ENCOUNTER — Encounter: Payer: 59 | Admitting: Gynecology

## 2018-11-15 ENCOUNTER — Other Ambulatory Visit: Payer: Self-pay

## 2018-11-15 ENCOUNTER — Encounter: Payer: Self-pay | Admitting: Gynecology

## 2018-11-15 ENCOUNTER — Ambulatory Visit (INDEPENDENT_AMBULATORY_CARE_PROVIDER_SITE_OTHER): Payer: 59 | Admitting: Gynecology

## 2018-11-15 VITALS — BP 118/70 | Ht 63.0 in | Wt 220.0 lb

## 2018-11-15 DIAGNOSIS — Z01419 Encounter for gynecological examination (general) (routine) without abnormal findings: Secondary | ICD-10-CM

## 2018-11-15 DIAGNOSIS — Z30431 Encounter for routine checking of intrauterine contraceptive device: Secondary | ICD-10-CM | POA: Diagnosis not present

## 2018-11-15 DIAGNOSIS — Z13220 Encounter for screening for lipoid disorders: Secondary | ICD-10-CM | POA: Diagnosis not present

## 2018-11-15 MED ORDER — DROSPIRENONE-ETHINYL ESTRADIOL 3-0.02 MG PO TABS: 1.0000 | ORAL_TABLET | Freq: Every day | ORAL | 4 refills | Status: DC

## 2018-11-15 NOTE — Addendum Note (Signed)
Addended by: Nelva Nay on: 11/15/2018 02:56 PM   Modules accepted: Orders

## 2018-11-15 NOTE — Patient Instructions (Signed)
Follow-up in 1 year for annual exam 

## 2018-11-15 NOTE — Progress Notes (Signed)
    Gabriella Sawyer Sep 01, 1980 [id number]        38 y.o.  G2P2002 for annual gynecologic exam.  Without gynecologic complaints  Past medical history,surgical history, problem list, medications, allergies, family history and social history were all reviewed and documented as reviewed in the EPIC chart.  ROS:  Performed with pertinent positives and negatives included in the history, assessment and plan.   Additional significant findings : None   Exam: Gabriella Sawyer assistant Vitals:   11/15/18 1357  BP: 118/70  Weight: 220 lb (99.8 kg)  Height: 5\' 3"  (1.6 m)   Body mass index is 38.97 kg/m.  General appearance:  Normal affect, orientation and appearance. Skin: Grossly normal HEENT: Without gross lesions.  No cervical or supraclavicular adenopathy. Thyroid normal.  Lungs:  Clear without wheezing, rales or rhonchi Cardiac: RR, without RMG Abdominal:  Soft, nontender, without masses, guarding, rebound, organomegaly or hernia Breasts:  Examined lying and sitting without masses, retractions, discharge or axillary adenopathy. Pelvic:  Ext, BUS, Vagina: Normal  Cervix: Normal.  IUD string visualized.  Pap smear done  Uterus: Anteverted, normal size, shape and contour, midline and mobile nontender   Adnexa: Without masses or tenderness    Anus and perineum: Normal   Rectovaginal: Normal sphincter tone without palpated masses or tenderness.    Assessment/Plan:  39 y.o. G64P2002 female for annual gynecologic exam.  Without menses, Mirena IUD, tubal sterilization  1. Mirena IUD for menstrual suppression placed 12/2014.  Also on Yaz for hirsutism doing well.  Desires to continue.  We discussed the risks multiple times to include thrombosis.  Refill x1 year provided.  Need to replace IUD next year discussed if she desires to continue for menstrual suppression.  She does have a tubal sterilization. 2. Breast health.  Breast exam normal today.  Recommend baseline mammography at age 32. 34. Pap  smear 2019 normal.  Pap smear done today.  History of HGSIL with LEEP 2015.  Positive endocervical margin with negative follow-up ECC.  Pap smear 2016 showed LGSIL.  Pap smear/HPV 2017 negative.  Pap smear 2018 and 2019 negative.  Discussed going to less frequent screening intervals if this Pap smear today is normal. 4. Health maintenance.  Baseline CBC, CMP and lipid profile done.  TSH hemoglobin A1c last year normal.  Follow-up in 1 year, sooner as needed.   [name], 2:22 PM 11/15/2018

## 2018-11-16 ENCOUNTER — Encounter: Payer: Self-pay | Admitting: Gynecology

## 2018-11-16 LAB — COMPREHENSIVE METABOLIC PANEL
AG Ratio: 1.6 (calc) (ref 1.0–2.5)
ALT: 9 U/L (ref 6–29)
AST: 15 U/L (ref 10–30)
Albumin: 3.6 g/dL (ref 3.6–5.1)
Alkaline phosphatase (APISO): 68 U/L (ref 31–125)
BUN: 10 mg/dL (ref 7–25)
CO2: 23 mmol/L (ref 20–32)
Calcium: 9.1 mg/dL (ref 8.6–10.2)
Chloride: 107 mmol/L (ref 98–110)
Creat: 0.89 mg/dL (ref 0.50–1.10)
Globulin: 2.3 g/dL (calc) (ref 1.9–3.7)
Glucose, Bld: 87 mg/dL (ref 65–99)
Potassium: 4.3 mmol/L (ref 3.5–5.3)
Sodium: 140 mmol/L (ref 135–146)
Total Bilirubin: 0.3 mg/dL (ref 0.2–1.2)
Total Protein: 5.9 g/dL — ABNORMAL LOW (ref 6.1–8.1)

## 2018-11-16 LAB — LIPID PANEL
Cholesterol: 171 mg/dL (ref <200)
HDL: 65 mg/dL (ref >=50)
LDL Cholesterol (Calc): 86 mg/dL (calc)
Non-HDL Cholesterol (Calc): 106 mg/dL (calc) (ref <130)
Total CHOL/HDL Ratio: 2.6 (calc) (ref <5.0)
Triglycerides: 105 mg/dL (ref <150)

## 2018-11-16 LAB — CBC WITH DIFFERENTIAL/PLATELET
Absolute Monocytes: 611 cells/uL (ref 200–950)
Basophils Absolute: 92 cells/uL (ref 0–200)
Basophils Relative: 1.3 %
Eosinophils Absolute: 220 cells/uL (ref 15–500)
Eosinophils Relative: 3.1 %
HCT: 34.7 % — ABNORMAL LOW (ref 35.0–45.0)
Hemoglobin: 11.1 g/dL — ABNORMAL LOW (ref 11.7–15.5)
Lymphs Abs: 2819 cells/uL (ref 850–3900)
MCH: 30.9 pg (ref 27.0–33.0)
MCHC: 32 g/dL (ref 32.0–36.0)
MCV: 96.7 fL (ref 80.0–100.0)
MPV: 10.9 fL (ref 7.5–12.5)
Monocytes Relative: 8.6 %
Neutro Abs: 3358 cells/uL (ref 1500–7800)
Neutrophils Relative %: 47.3 %
Platelets: 414 10*3/uL — ABNORMAL HIGH (ref 140–400)
RBC: 3.59 10*6/uL — ABNORMAL LOW (ref 3.80–5.10)
RDW: 12.4 % (ref 11.0–15.0)
Total Lymphocyte: 39.7 %
WBC: 7.1 10*3/uL (ref 3.8–10.8)

## 2018-11-16 LAB — PAP IG W/ RFLX HPV ASCU

## 2018-11-16 NOTE — Telephone Encounter (Signed)
There are prescription iron supplements but any over-the-counter would be okay and they come very cheaply.

## 2018-11-16 NOTE — Telephone Encounter (Signed)
Check with pharmacist for me.  Patient interested in liquid iron that I am not used to prescribing.  What I read is ferrous sulfate 220 is 1 preparation with usual dosage 1 teaspoon daily.  I would prefer though for the pharmacist to make recommendations for an liquid iron supplement for anemia

## 2018-11-19 MED ORDER — FERROUS SULFATE 220 (44 FE) MG/5ML PO LIQD: ORAL | 0 refills | Status: DC

## 2018-11-19 NOTE — Telephone Encounter (Signed)
Byran from [facility] called me back and told me how to prescribe dosage. Rx sent.

## 2018-11-19 NOTE — Telephone Encounter (Signed)
I called and left message with pharmacist to call me regarding the below.

## 2018-12-25 ENCOUNTER — Encounter: Payer: Self-pay | Admitting: Gynecology

## 2019-02-27 ENCOUNTER — Encounter (HOSPITAL_BASED_OUTPATIENT_CLINIC_OR_DEPARTMENT_OTHER): Payer: Self-pay | Admitting: *Deleted

## 2019-02-27 ENCOUNTER — Emergency Department (HOSPITAL_BASED_OUTPATIENT_CLINIC_OR_DEPARTMENT_OTHER)
Admission: EM | Admit: 2019-02-27 | Discharge: 2019-02-28 | Disposition: A | Discharge status: Home / Self Care | Payer: 59 | Attending: Emergency Medicine | Admitting: Emergency Medicine

## 2019-02-27 ENCOUNTER — Other Ambulatory Visit: Payer: Self-pay

## 2019-02-27 ENCOUNTER — Emergency Department (HOSPITAL_BASED_OUTPATIENT_CLINIC_OR_DEPARTMENT_OTHER): Payer: 59

## 2019-02-27 DIAGNOSIS — Y939 Activity, unspecified: Secondary | ICD-10-CM | POA: Insufficient documentation

## 2019-02-27 DIAGNOSIS — S97121A Crushing injury of right lesser toe(s), initial encounter: Secondary | ICD-10-CM | POA: Insufficient documentation

## 2019-02-27 DIAGNOSIS — Y999 Unspecified external cause status: Secondary | ICD-10-CM | POA: Diagnosis not present

## 2019-02-27 DIAGNOSIS — Y929 Unspecified place or not applicable: Secondary | ICD-10-CM | POA: Diagnosis not present

## 2019-02-27 DIAGNOSIS — W208XXA Other cause of strike by thrown, projected or falling object, initial encounter: Secondary | ICD-10-CM | POA: Diagnosis not present

## 2019-02-27 DIAGNOSIS — Z7982 Long term (current) use of aspirin: Secondary | ICD-10-CM | POA: Diagnosis not present

## 2019-02-27 NOTE — ED Triage Notes (Signed)
Pt c/o right 2nd toe injury x 8 hrs ago

## 2019-02-28 MED ORDER — ONDANSETRON 8 MG PO TBDP: 8.0000 mg | ORAL_TABLET | Freq: Three times a day (TID) | ORAL | 1 refills | Status: DC | PRN

## 2019-02-28 MED ORDER — NAPROXEN 250 MG PO TABS
500.0000 mg | ORAL_TABLET | Freq: Once | ORAL | Status: AC
  Administered 2019-02-28: 500 mg via ORAL
  Filled 2019-02-28: qty 2

## 2019-02-28 MED ORDER — HYDROCODONE-ACETAMINOPHEN 5-325 MG PO TABS: 1.0000 | ORAL_TABLET | Freq: Four times a day (QID) | ORAL | 0 refills | Status: DC | PRN

## 2019-02-28 NOTE — ED Provider Notes (Signed)
[facility] Provider Note: Gabriella Dell, MD, FACEP  CSN: [id number] MRN: [id number] ARRIVAL: 02/27/19 at 2135 ROOM: MH05/[facility]05   CHIEF COMPLAINT  Toe Injury   HISTORY OF PRESENT ILLNESS  02/28/19 12:42 AM Gabriella Sawyer is a 38 y.o. female who dropped a Pyrex dish containing ham onto her right second toe yesterday morning.  She is having 6 out of 10 pain in that toe, worse with ambulation or movement.  There is also associated ecchymosis.  She works on her feet and was standing on it through most of the day yesterday.   Past Medical History:  Diagnosis Date  . Arthritis   . Common migraine with intractable migraine 05/17/2017  . LGSIL (low grade squamous intraepithelial dysplasia) 09/2013, 09/2014   Positive high risk HPV screen 2015, negative high risk HPV 2016  . Panic attacks   . PCOS (polycystic ovarian syndrome)   . PONV (postoperative nausea and vomiting)     Past Surgical History:  Procedure Laterality Date  . CERVICAL BIOPSY  W/ LOOP ELECTRODE EXCISION  11/01/2013   HGSIL on cervical biopsy, LGSIL on LEEP specimen with endocervical margin involvement  . CESAREAN SECTION  12-03-2001  &  03-24-2003   LAST ONE W/ TUBAL LIGATION  . FOOT SURGERY  2000   left foot, screw inserted  . INTRAUTERINE DEVICE INSERTION  01/01/2015   Mirena  . KNEE ARTHROSCOPY  12/16/2011   Procedure: ARTHROSCOPY KNEE;  Surgeon: Javier Docker, MD;  Location: [facility];  Service: Orthopedics;  Laterality: Right;  Right Knee Arthroscopy with Excision of Tibial Exostosis, debridement right knee medial menisectomy  . KNEE ARTHROSCOPY  03/21/2012   Procedure: ARTHROSCOPY KNEE;  Surgeon: Eugenia Mcalpine, MD;  Location: [facility];  Service: Orthopedics;  Laterality: Right;  right chondroplasty  . KNEE ARTHROSCOPY W/ MENISCECTOMY  07-08-2011  DR [facility]   RIGHT KNEE  . KNEE SURGERY    . LAPAROSCOPIC CHOLECYSTECTOMY  12-22-2008  . MEDIAL COLLATERAL LIGAMENT REPAIR, KNEE  03/21/2012   Procedure:  RECONSTRUCTION MEDIAL COLLATERAL LIGAMENT (MCL);  Surgeon: Eugenia Mcalpine, MD;  Location: [facility];  Service: Orthopedics;  Laterality: Right;  achilles tendon allograft  . ROUX-EN-Y GASTRIC BYPASS  01-22-2008   LAPAROSCOPIC  . TONSILLECTOMY AND ADENOIDECTOMY  1995  . TUBAL LIGATION    . WISDOM TOOTH EXTRACTION  2001    Family History  Problem Relation Age of Onset  . Diabetes Father   . Hypertension Father   . Cancer Maternal Grandmother        COLON CANCER  . Diabetes Paternal Grandfather     Social History   Tobacco Use  . Smoking status: Never Smoker  . Smokeless tobacco: Never Used  Substance Use Topics  . Alcohol use: No    Alcohol/week: 0.0 standard drinks  . Drug use: No    Prior to Admission medications   Medication Sig Start Date End Date Taking? Authorizing Provider  albuterol (VENTOLIN HFA) 108 (90 Base) MCG/ACT inhaler Inhale 1-2 puffs into the lungs every 6 (six) hours as needed for wheezing or shortness of breath. 07/30/18   Bast, Traci A, NP  ALPRAZolam (XANAX) 1 MG tablet Take 1 mg by mouth at bedtime as needed for anxiety.    [provider]  amphetamine-dextroamphetamine (ADDERALL) 20 MG tablet Take 20 mg by mouth 2 (two) times daily.    [provider]  calcium citrate-vitamin D (CITRACAL+D) 315-200 MG-UNIT per tablet Take 2 tablets by mouth 2 (two) times daily.  [provider]  Cyanocobalamin (B-12 PO) Place 1 tablet under the tongue daily.     [provider]  drospirenone-ethinyl estradiol (NIKKI) 3-0.02 MG tablet Take 1 tablet by mouth daily. 11/15/18   Fontaine, Belinda Block, MD  EPINEPHrine (EPI-PEN) 0.3 mg/0.3 mL DEVI Inject 0.3 mg into the muscle once as needed. reaction    [provider]  Ferrous Sulfate 220 (44 Fe) MG/5ML LIQD Take 7.5 ml once daily 11/19/18   Fontaine, Belinda Block, MD  gabapentin (NEURONTIN) 300 MG capsule Take 600 mg by mouth 3 (three) times daily. 07/08/16   [provider]   levonorgestrel (MIRENA) 20 MCG/24HR IUD 1 each by Intrauterine route once.    [provider]  PARoxetine (PAXIL) 20 MG tablet Take 20 mg by mouth daily.    [provider]  topiramate (TOPAMAX) 25 MG tablet Take one tablet at night for one week, then take 2 tablets at night for one week, then take 3 tablets at night. 06/07/17   Kathrynn Ducking, MD    Allergies Codeine and Shrimp [shellfish allergy]   REVIEW OF SYSTEMS  Negative except as noted here or in the History of Present Illness.   PHYSICAL EXAMINATION  Initial Vital Signs Blood pressure 118/79, pulse 63, temperature 98.1 F (36.7 C), resp. rate 16, height 5\' 2"  (1.575 m), weight 83.9 kg, SpO2 100 %.  Examination General: Well-developed, well-nourished female in no acute distress; appearance consistent with age of record HENT: normocephalic; atraumatic Eyes: Normal appearance Neck: supple Heart: regular rate and rhythm Lungs: clear to auscultation bilaterally Abdomen: soft; nondistended; nontender; bowel sounds present Extremities: No deformity; pulses normal; ecchymosis and tenderness of right second toe, range of motion not tested due to pain Neurologic: Awake, alert and oriented; motor function intact in all extremities and symmetric; no facial droop Skin: Warm and dry Psychiatric: Normal mood and affect   RESULTS  Summary of this visit's results, reviewed and interpreted by myself:   EKG Interpretation  Date/Time:    Ventricular Rate:    PR Interval:    QRS Duration:   QT Interval:    QTC Calculation:   R Axis:     Text Interpretation:        Laboratory Studies: No results found for this or any previous visit (from the past 24 hour(s)). Imaging Studies: Dg Toe 2nd Right  Result Date: 02/27/2019 CLINICAL DATA:  Crushing injury, dropped glassware on foot this morning EXAM: RIGHT SECOND TOE COMPARISON:  None. FINDINGS: Minimal soft tissue swelling. No acute bony abnormality.  Specifically, no fracture, subluxation, or dislocation. IMPRESSION: No acute osseous abnormality.  Minimal swelling. Electronically Signed   By: Lovena Le M.D.   On: 02/27/2019 22:16    ED COURSE and MDM  Nursing notes, initial and subsequent vitals signs, including pulse oximetry, reviewed and interpreted by myself.  Vitals:   02/27/19 2144 02/27/19 2146  BP:  118/79  Pulse:  63  Resp:  16  Temp:  98.1 F (36.7 C)  SpO2:  100%  Weight: 83.9 kg   Height: 5\' 2"  (1.575 m)    Medications - No data to display  There is no evidence of fracture on radiograph.  I suspect she has a deep contusion due to her crush injury.  PROCEDURES  Procedures   ED DIAGNOSES     ICD-10-CM   1. Crushing injury of second toe of right foot, initial encounter  O87.867E        Gabriella Sawyer, Jenny Reichmann, MD  02/28/19 0104  

## 2019-02-28 NOTE — ED Notes (Signed)
ED Provider at bedside. 

## 2019-03-18 ENCOUNTER — Encounter: Payer: Self-pay | Admitting: Gynecology

## 2019-03-18 ENCOUNTER — Telehealth: Payer: Self-pay | Admitting: *Deleted

## 2019-03-18 MED ORDER — YAZ 3-0.02 MG PO TABS: 1.0000 | ORAL_TABLET | Freq: Every day | ORAL | 2 refills | Status: DC

## 2019-03-18 NOTE — Telephone Encounter (Signed)
Patient called stating insurance will no longer pay for generic Dan Maker, the preferred medication is Yaz. I explained to her that Lexine Baton is generic yaz and I will send in brand Yaz. Patient verbalized she understood, will call if problems.

## 2019-03-18 NOTE — Telephone Encounter (Signed)
Dr.Fontaine patient insurance will no longer pay for generic yaz, she provided a list of no cost birth control pills for you to pick from.

## 2019-03-18 NOTE — Telephone Encounter (Signed)
Patient called back stating the brand Gabriella Sawyer is too expensive, I asked patient to check with insurance and call me back with name so I can relay to to provider.

## 2019-03-19 MED ORDER — NORETHIN ACE-ETH ESTRAD-FE 1-20 MG-MCG PO TABS: 1.0000 | ORAL_TABLET | Freq: Every day | ORAL | 2 refills | Status: DC

## 2019-03-19 NOTE — Telephone Encounter (Signed)
We can try Junel or Microgestin

## 2019-09-04 ENCOUNTER — Other Ambulatory Visit: Payer: Self-pay

## 2019-09-04 ENCOUNTER — Ambulatory Visit
Admission: EM | Admit: 2019-09-04 | Discharge: 2019-09-04 | Disposition: A | Discharge status: Home / Self Care | Payer: 59 | Attending: Physician Assistant | Admitting: Physician Assistant

## 2019-09-04 DIAGNOSIS — H1032 Unspecified acute conjunctivitis, left eye: Secondary | ICD-10-CM

## 2019-09-04 MED ORDER — HYDROCODONE-ACETAMINOPHEN 5-325 MG PO TABS: 1.0000 | ORAL_TABLET | Freq: Four times a day (QID) | ORAL | 0 refills | Status: DC | PRN

## 2019-09-04 MED ORDER — CIPROFLOXACIN HCL 0.3 % OP SOLN: OPHTHALMIC | 0 refills | Status: DC

## 2019-09-04 NOTE — ED Provider Notes (Signed)
[name]    CSN: [id number] Arrival date & time: 09/04/19  0930      History   Chief Complaint Chief Complaint  Patient presents with   Eye Pain    HPI BRISTOL OSENTOSKI is a 39 y.o. female.   Pt works in a pharmacy.  Pt reports she is unsure what she could have been exposed to.  Pt denies any injury.    The history is provided by the patient. No language interpreter was used.  Eye Pain This is a new problem. The current episode started 6 to 12 hours ago. The problem occurs constantly. The problem has been gradually worsening. Nothing aggravates the symptoms. Nothing relieves the symptoms. She has tried nothing for the symptoms. The treatment provided no relief.    Past Medical History:  Diagnosis Date   Arthritis    Common migraine with intractable migraine 05/17/2017   LGSIL (low grade squamous intraepithelial dysplasia) 09/2013, 09/2014   Positive high risk HPV screen 2015, negative high risk HPV 2016   Panic attacks    PCOS (polycystic ovarian syndrome)    PONV (postoperative nausea and vomiting)     Patient Active Problem List   Diagnosis Date Noted   Common migraine with intractable migraine 05/17/2017   Major depressive disorder, single episode, moderate ([place]) 07/21/2016   VAIN I (vaginal intraepithelial neoplasia grade I) 07/29/2011   PCOS (polycystic ovarian syndrome)     Past Surgical History:  Procedure Laterality Date   CERVICAL BIOPSY  W/ LOOP ELECTRODE EXCISION  11/01/2013   HGSIL on cervical biopsy, LGSIL on LEEP specimen with endocervical margin involvement   CESAREAN SECTION  12-03-2001  &  03-24-2003   LAST ONE W/ TUBAL LIGATION   FOOT SURGERY  2000   left foot, screw inserted   INTRAUTERINE DEVICE INSERTION  01/01/2015   Mirena   KNEE ARTHROSCOPY  12/16/2011   Procedure: ARTHROSCOPY KNEE;  Surgeon: Johnn Hai, MD;  Location: [facility];  Service: Orthopedics;  Laterality: Right;  Right Knee Arthroscopy with Excision of  Tibial Exostosis, debridement right knee medial menisectomy   KNEE ARTHROSCOPY  03/21/2012   Procedure: ARTHROSCOPY KNEE;  Surgeon: Sydnee Cabal, MD;  Location: [facility];  Service: Orthopedics;  Laterality: Right;  right chondroplasty   KNEE ARTHROSCOPY W/ MENISCECTOMY  07-08-2011  DR [facility]   RIGHT KNEE   KNEE SURGERY     LAPAROSCOPIC CHOLECYSTECTOMY  12-22-2008   MEDIAL COLLATERAL LIGAMENT REPAIR, KNEE  03/21/2012   Procedure: RECONSTRUCTION MEDIAL COLLATERAL LIGAMENT (MCL);  Surgeon: Sydnee Cabal, MD;  Location: [facility];  Service: Orthopedics;  Laterality: Right;  achilles tendon allograft   ROUX-EN-Y GASTRIC BYPASS  01-22-2008   LAPAROSCOPIC   TONSILLECTOMY AND ADENOIDECTOMY  1995   TUBAL LIGATION     WISDOM TOOTH EXTRACTION  2001    OB History    Gravida  2   Para  2   Term  2   Preterm      AB      Living  2     SAB      TAB      Ectopic      Multiple      Live Births               Home Medications    Prior to Admission medications   Medication Sig Start Date End Date Taking? Authorizing Provider  albuterol (VENTOLIN HFA) 108 (90 Base) MCG/ACT inhaler Inhale 1-2 puffs into the lungs  every 6 (six) hours as needed for wheezing or shortness of breath. 07/30/18   Dahlia Byes A, NP  ALPRAZolam (XANAX) 1 MG tablet Take 1 mg by mouth at bedtime as needed for anxiety.    [provider]  calcium citrate-vitamin D (CITRACAL+D) 315-200 MG-UNIT per tablet Take 2 tablets by mouth 2 (two) times daily.    [provider]  ciprofloxacin (CILOXAN) 0.3 % ophthalmic solution Administer 1 drop, every 1 hours, while awake, for 1 days. Then 1 drop, every 4 hours, while awake, for the next 5 days. 09/04/19   Elson Areas, PA-C  Cyanocobalamin (B-12 PO) Place 1 tablet under the tongue daily.     [provider]  EPINEPHrine (EPI-PEN) 0.3 mg/0.3 mL DEVI Inject 0.3 mg into the muscle once as needed. reaction    [provider]  Ferrous  Sulfate 220 (44 Fe) MG/5ML LIQD Take 7.5 ml once daily 11/19/18   Fontaine, Nadyne Coombes, MD  gabapentin (NEURONTIN) 300 MG capsule Take 600 mg by mouth 3 (three) times daily. 07/08/16   [provider]  HYDROcodone-acetaminophen (NORCO/VICODIN) 5-325 MG tablet Take 1 tablet by mouth every 6 (six) hours as needed for moderate pain. 09/04/19 09/03/20  Elson Areas, PA-C  levonorgestrel (MIRENA) 20 MCG/24HR IUD 1 each by Intrauterine route once.    [provider]  norethindrone-ethinyl estradiol (JUNEL FE 1/20) 1-20 MG-MCG tablet Take 1 tablet by mouth daily. 03/19/19   Fontaine, Nadyne Coombes, MD  ondansetron (ZOFRAN ODT) 8 MG disintegrating tablet Take 1 tablet (8 mg total) by mouth every 8 (eight) hours as needed for nausea or vomiting. 02/28/19   Molpus, John, MD  PARoxetine (PAXIL) 20 MG tablet Take 20 mg by mouth daily.    [provider]    Family History Family History  Problem Relation Age of Onset   Diabetes Father    Hypertension Father    Cancer Maternal Grandmother        COLON CANCER   Diabetes Paternal Grandfather     Social History Social History   Tobacco Use   Smoking status: Never Smoker   Smokeless tobacco: Never Used  Substance Use Topics   Alcohol use: No    Alcohol/week: 0.0 standard drinks   Drug use: No     Allergies   Codeine and Shrimp [shellfish allergy]   Review of Systems Review of Systems  Eyes: Positive for pain.  All other systems reviewed and are negative.    Physical Exam Triage Vital Signs ED Triage Vitals [09/04/19 0946]  Enc Vitals Group     BP 110/76     Pulse Rate 84     Resp 16     Temp 98.2 F (36.8 C)     Temp Source Oral     SpO2 96 %     Weight      Height      Head Circumference      Peak Flow      Pain Score 10     Pain Loc      Pain Edu?      Excl. in [place]?    No data found.  Updated Vital Signs BP 110/76 (BP Location: Left Arm)    Pulse 84    Temp 98.2 F (36.8 C) (Oral)    Resp 16     SpO2 96%   Visual Acuity Right Eye Distance:   Left Eye Distance:   Bilateral Distance:    Right Eye Near:  Left Eye Near:    Bilateral Near:     Physical Exam Vitals and nursing note reviewed.  Constitutional:      Appearance: She is well-developed.  HENT:     Head: Normocephalic.  Eyes:     General:        Left eye: Discharge present.    Extraocular Movements: Extraocular movements intact.     Pupils: Pupils are equal, round, and reactive to light.     Comments: injjected left conjunctiva,  fluro no ulcer or lesion.  No foreeign body   Cardiovascular:     Rate and Rhythm: Normal rate.  Pulmonary:     Effort: Pulmonary effort is normal.  Abdominal:     General: There is no distension.  Musculoskeletal:        General: Normal range of motion.     Cervical back: Normal range of motion.  Skin:    Coloration: Skin is jaundiced.  Neurological:     Mental Status: She is alert and oriented to person, place, and time.  Psychiatric:        Mood and Affect: Mood normal.      UC Treatments / Results  Labs (all labs ordered are listed, but only abnormal results are displayed) Labs Reviewed - No data to display  EKG   Radiology No results found.  Procedures Procedures (including critical care time)  Medications Ordered in UC Medications - No data to display  Initial Impression / Assessment and Plan / UC Course  I have reviewed the triage vital signs and the nursing notes.  Pertinent labs & imaging results that were available during my care of the patient were reviewed by me and considered in my medical decision making (see chart for details).     MDM:   Pt given cipro eye drops, cipro eye drops  Final Clinical Impressions(s) / UC Diagnoses   Final diagnoses:  Acute bacterial conjunctivitis of left eye   Discharge Instructions   None    ED Prescriptions    Medication Sig Dispense Auth. Provider   ciprofloxacin (CILOXAN) 0.3 % ophthalmic solution  Administer 1 drop, every 1 hours, while awake, for 1 days. Then 1 drop, every 4 hours, while awake, for the next 5 days. 5 mL Carina Chaplin K, [place]   HYDROcodone-acetaminophen (NORCO/VICODIN) 5-325 MG tablet Take 1 tablet by mouth every 6 (six) hours as needed for moderate pain. 16 tablet Elson Areas, [place]     I have reviewed the PDMP during this encounter.  An After Visit Summary was printed and given to the patient.    Elson Areas, [place] 09/04/19 1014

## 2019-09-04 NOTE — ED Triage Notes (Signed)
Pt c/o lt eye redness, drainage, light sensitive, and pain since yesterday.

## 2019-09-06 ENCOUNTER — Ambulatory Visit
Admission: EM | Admit: 2019-09-06 | Discharge: 2019-09-06 | Disposition: A | Discharge status: Home / Self Care | Payer: 59 | Attending: Emergency Medicine | Admitting: Emergency Medicine

## 2019-09-06 DIAGNOSIS — H109 Unspecified conjunctivitis: Secondary | ICD-10-CM | POA: Diagnosis not present

## 2019-09-06 MED ORDER — CIPROFLOXACIN HCL 0.3 % OP SOLN: OPHTHALMIC | 0 refills | Status: DC

## 2019-09-06 NOTE — Discharge Instructions (Addendum)
Use eyedrops as directed on eye(s) as prescribed.  Important to follow up with Ophthalmology (eye doctor). Return sooner for worsening of symptoms, change in vision, sensitivity to light, eye swelling, painful eye movement, or fever.  

## 2019-09-06 NOTE — ED Provider Notes (Signed)
[name]    CSN: [id number] Arrival date & time: 09/06/19  9381      History   Chief Complaint Chief Complaint  Patient presents with  . Eye Drainage    HPI Gabriella Sawyer is a 39 y.o. female presenting for right eye irritation.  Patient seen initially for this on 09/04/2019: Given ciprofloxacin drops for acute bacterial conjunctivitis and Norco for pain.  Was instructed to follow-up with ophthalmology for persistent or worsening symptoms.  Patient states she has not done so.  Patient denying visual change, fever, trauma to area.  Has been using ciprofloxacin eyedrops in left eye.  Does wear contact lenses: Has been wearing glasses since last seen.  Past Medical History:  Diagnosis Date  . Arthritis   . Common migraine with intractable migraine 05/17/2017  . LGSIL (low grade squamous intraepithelial dysplasia) 09/2013, 09/2014   Positive high risk HPV screen 2015, negative high risk HPV 2016  . Panic attacks   . PCOS (polycystic ovarian syndrome)   . PONV (postoperative nausea and vomiting)     Patient Active Problem List   Diagnosis Date Noted  . Common migraine with intractable migraine 05/17/2017  . Major depressive disorder, single episode, moderate ([facility]) 07/21/2016  . VAIN I (vaginal intraepithelial neoplasia grade I) 07/29/2011  . PCOS (polycystic ovarian syndrome)     Past Surgical History:  Procedure Laterality Date  . CERVICAL BIOPSY  W/ LOOP ELECTRODE EXCISION  11/01/2013   HGSIL on cervical biopsy, LGSIL on LEEP specimen with endocervical margin involvement  . CESAREAN SECTION  12-03-2001  &  03-24-2003   LAST ONE W/ TUBAL LIGATION  . FOOT SURGERY  2000   left foot, screw inserted  . INTRAUTERINE DEVICE INSERTION  01/01/2015   Mirena  . KNEE ARTHROSCOPY  12/16/2011   Procedure: ARTHROSCOPY KNEE;  Surgeon: Javier Docker, MD;  Location: [facility];  Service: Orthopedics;  Laterality: Right;  Right Knee Arthroscopy with Excision of Tibial Exostosis,  debridement right knee medial menisectomy  . KNEE ARTHROSCOPY  03/21/2012   Procedure: ARTHROSCOPY KNEE;  Surgeon: Eugenia Mcalpine, MD;  Location: [facility];  Service: Orthopedics;  Laterality: Right;  right chondroplasty  . KNEE ARTHROSCOPY W/ MENISCECTOMY  07-08-2011  DR [facility]   RIGHT KNEE  . KNEE SURGERY    . LAPAROSCOPIC CHOLECYSTECTOMY  12-22-2008  . MEDIAL COLLATERAL LIGAMENT REPAIR, KNEE  03/21/2012   Procedure: RECONSTRUCTION MEDIAL COLLATERAL LIGAMENT (MCL);  Surgeon: Eugenia Mcalpine, MD;  Location: [facility];  Service: Orthopedics;  Laterality: Right;  achilles tendon allograft  . ROUX-EN-Y GASTRIC BYPASS  01-22-2008   LAPAROSCOPIC  . TONSILLECTOMY AND ADENOIDECTOMY  1995  . TUBAL LIGATION    . WISDOM TOOTH EXTRACTION  2001    OB History    Gravida  2   Para  2   Term  2   Preterm      AB      Living  2     SAB      TAB      Ectopic      Multiple      Live Births               Home Medications    Prior to Admission medications   Medication Sig Start Date End Date Taking? Authorizing Provider  albuterol (VENTOLIN HFA) 108 (90 Base) MCG/ACT inhaler Inhale 1-2 puffs into the lungs every 6 (six) hours as needed for wheezing or shortness of breath. 07/30/18  Loura Halt A, NP  ALPRAZolam (XANAX) 1 MG tablet Take 1 mg by mouth at bedtime as needed for anxiety.    [provider]  calcium citrate-vitamin D (CITRACAL+D) 315-200 MG-UNIT per tablet Take 2 tablets by mouth 2 (two) times daily.    [provider]  ciprofloxacin (CILOXAN) 0.3 % ophthalmic solution Administer 1 drop, every 2 hours, while awake, for 1 day. Then 1 drop, every 4 hours, while awake, for the next 5 days. 09/06/19   Hall-Potvin, [place], PA-C  Cyanocobalamin (B-12 PO) Place 1 tablet under the tongue daily.     [provider]  EPINEPHrine (EPI-PEN) 0.3 mg/0.3 mL DEVI Inject 0.3 mg into the muscle once as needed. reaction    [provider]  Ferrous Sulfate 220  (44 Fe) MG/5ML LIQD Take 7.5 ml once daily 11/19/18   Fontaine, Belinda Block, MD  gabapentin (NEURONTIN) 300 MG capsule Take 600 mg by mouth 3 (three) times daily. 07/08/16   [provider]  HYDROcodone-acetaminophen (NORCO/VICODIN) 5-325 MG tablet Take 1 tablet by mouth every 6 (six) hours as needed for moderate pain. 09/04/19 09/03/20  Fransico Meadow, PA-C  levonorgestrel (MIRENA) 20 MCG/24HR IUD 1 each by Intrauterine route once.    [provider]  norethindrone-ethinyl estradiol (JUNEL FE 1/20) 1-20 MG-MCG tablet Take 1 tablet by mouth daily. 03/19/19   Fontaine, Belinda Block, MD  ondansetron (ZOFRAN ODT) 8 MG disintegrating tablet Take 1 tablet (8 mg total) by mouth every 8 (eight) hours as needed for nausea or vomiting. 02/28/19   Molpus, John, MD  PARoxetine (PAXIL) 20 MG tablet Take 20 mg by mouth daily.    [provider]    Family History Family History  Problem Relation Age of Onset  . Diabetes Father   . Hypertension Father   . Cancer Maternal Grandmother        COLON CANCER  . Diabetes Paternal Grandfather     Social History Social History   Tobacco Use  . Smoking status: Never Smoker  . Smokeless tobacco: Never Used  Substance Use Topics  . Alcohol use: No    Alcohol/week: 0.0 standard drinks  . Drug use: No     Allergies   Codeine and Shrimp [shellfish allergy]   Review of Systems As per HPI   Physical Exam Triage Vital Signs ED Triage Vitals  Enc Vitals Group     BP      Pulse      Resp      Temp      Temp src      SpO2      Weight      Height      Head Circumference      Peak Flow      Pain Score      Pain Loc      Pain Edu?      Excl. in [place]?    No data found.  Updated Vital Signs BP 117/74 (BP Location: Left Arm)   Pulse 81   Temp 99.2 F (37.3 C) (Oral)   Resp 18   SpO2 95%   Visual Acuity Right Eye Distance:   Left Eye Distance:   Bilateral Distance:    Right Eye Near:   Left Eye Near:    Bilateral  Near:     Physical Exam Constitutional:      General: She is not in acute distress. HENT:     Head: Normocephalic and atraumatic.  Eyes:  General: No scleral icterus.       Right eye: No discharge.        Left eye: Discharge present.    Pupils: Pupils are equal, round, and reactive to light.     Comments: Left conjunctive a injected without chemosis.  Right eye unremarkable  Cardiovascular:     Rate and Rhythm: Normal rate.  Pulmonary:     Effort: Pulmonary effort is normal.  Skin:    Coloration: Skin is not jaundiced or pale.  Neurological:     Mental Status: She is alert and oriented to person, place, and time.      UC Treatments / Results  Labs (all labs ordered are listed, but only abnormal results are displayed) Labs Reviewed - No data to display  EKG   Radiology No results found.  Procedures Procedures (including critical care time)  Medications Ordered in UC Medications - No data to display  Initial Impression / Assessment and Plan / UC Course  I have reviewed the triage vital signs and the nursing notes.  Pertinent labs & imaging results that were available during my care of the patient were reviewed by me and considered in my medical decision making (see chart for details).     Patient febrile, nontoxic in office today.  Patient does have bacterial conjunctivitis in left eye, endorsing symptoms in right eye despite reassuring exam.  Provided refill of ciprofloxacin.  Patient was given 4 days worth of narcotics on 6/2.  Discussed unable to do refill at this time.  Patient verbalized frustration.  This provider coordinated care: Scheduled ophthalmology appointment-to present to their office directly from UC.  Patient verbalized understanding, will keep appointment.  This provider verify that patient kept appointment with ophthalmology and has follow-up Monday.   Final Clinical Impressions(s) / UC Diagnoses   Final diagnoses:  Bacterial conjunctivitis of  both eyes     Discharge Instructions     Use eyedrops as directed on eye(s) as prescribed.   Important to follow up with Ophthalmology (eye doctor). Return sooner for worsening of symptoms, change in vision, sensitivity to light, eye swelling, painful eye movement, or fever.     ED Prescriptions    Medication Sig Dispense Auth. Provider   ciprofloxacin (CILOXAN) 0.3 % ophthalmic solution Administer 1 drop, every 2 hours, while awake, for 1 day. Then 1 drop, every 4 hours, while awake, for the next 5 days. 10 mL Hall-Potvin, [place], PA-C     I have reviewed the PDMP during this encounter.   Hall-Potvin, [place], [place] 09/06/19 1248

## 2019-09-06 NOTE — ED Triage Notes (Signed)
Pt states here 2 days ago for pink eye to lt eye and now having the same in rt eye.

## 2019-10-28 ENCOUNTER — Ambulatory Visit (INDEPENDENT_AMBULATORY_CARE_PROVIDER_SITE_OTHER): Payer: 59

## 2019-10-28 ENCOUNTER — Ambulatory Visit
Admission: EM | Admit: 2019-10-28 | Discharge: 2019-10-28 | Disposition: A | Discharge status: Home / Self Care | Payer: 59 | Attending: Family Medicine | Admitting: Family Medicine

## 2019-10-28 ENCOUNTER — Encounter: Payer: Self-pay | Admitting: Emergency Medicine

## 2019-10-28 ENCOUNTER — Other Ambulatory Visit: Payer: Self-pay

## 2019-10-28 DIAGNOSIS — G8929 Other chronic pain: Secondary | ICD-10-CM

## 2019-10-28 DIAGNOSIS — M79671 Pain in right foot: Secondary | ICD-10-CM

## 2019-10-28 DIAGNOSIS — M722 Plantar fascial fibromatosis: Secondary | ICD-10-CM

## 2019-10-28 MED ORDER — HYDROCODONE-ACETAMINOPHEN 5-325 MG PO TABS: 1.0000 | ORAL_TABLET | Freq: Four times a day (QID) | ORAL | 0 refills | Status: DC | PRN

## 2019-10-28 MED ORDER — PREDNISONE 10 MG (21) PO TBPK: ORAL_TABLET | ORAL | 0 refills | Status: DC

## 2019-10-28 MED ORDER — KETOROLAC TROMETHAMINE 30 MG/ML IJ SOLN
30.0000 mg | Freq: Once | INTRAMUSCULAR | Status: AC
  Administered 2019-10-28: 30 mg via INTRAMUSCULAR

## 2019-10-28 NOTE — Discharge Instructions (Addendum)
This is most likely plantar fasciitis.  Starting a prednisone taper to take over the next 6 days.  Take this with food.  Toradol given here for pain.  I will give you a few days worth stronger pain medicine until the prednisone starts working.  The best thing for you to do is rest, ice, elevate the foot. You may want to purchase some more supportive shoes Follow up as needed for continued or worsening symptoms

## 2019-10-28 NOTE — ED Triage Notes (Addendum)
Pt presents to [facility] for assessment of intermittent right foot pain and swelling with occasional numbness x 2-3 months.  Patient states regardless of position or bearing weight, it still hurts.  Pt states hx of right knee surgeries multiple times to that same side.  States she takes neurontin and Tylenol (unable to take nsaids) but the pain is not being relieved.

## 2019-10-29 NOTE — ED Provider Notes (Signed)
[facility]    CSN: [id number] Arrival date & time: 10/28/19  1135      History   Chief Complaint Chief Complaint  Patient presents with  . Foot Pain    HPI Gabriella Sawyer is a 39 y.o. female.   Patient is a 39 year old female who presents today for assessment of right foot pain and swelling.  This is been constant worsening over the past 2 to 3 months.  The pain is located to the plantar aspect of the foot and lateral aspect.  Some associated numbness and tingling at times.  Worse with ambulation.  Worse after going to the beach and doing increased walking this past weekend.  Has been taking Neurontin, Tylenol without any relief of her pain.  Denies any specific injuries.  ROS per HPI      Past Medical History:  Diagnosis Date  . Arthritis   . Common migraine with intractable migraine 05/17/2017  . LGSIL (low grade squamous intraepithelial dysplasia) 09/2013, 09/2014   Positive high risk HPV screen 2015, negative high risk HPV 2016  . Panic attacks   . PCOS (polycystic ovarian syndrome)   . PONV (postoperative nausea and vomiting)     Patient Active Problem List   Diagnosis Date Noted  . Common migraine with intractable migraine 05/17/2017  . Major depressive disorder, single episode, moderate (HCC) 07/21/2016  . VAIN I (vaginal intraepithelial neoplasia grade I) 07/29/2011  . PCOS (polycystic ovarian syndrome)     Past Surgical History:  Procedure Laterality Date  . CERVICAL BIOPSY  W/ LOOP ELECTRODE EXCISION  11/01/2013   HGSIL on cervical biopsy, LGSIL on LEEP specimen with endocervical margin involvement  . CESAREAN SECTION  12-03-2001  &  03-24-2003   LAST ONE W/ TUBAL LIGATION  . FOOT SURGERY  2000   left foot, screw inserted  . INTRAUTERINE DEVICE INSERTION  01/01/2015   Mirena  . KNEE ARTHROSCOPY  12/16/2011   Procedure: ARTHROSCOPY KNEE;  Surgeon: Javier Docker, MD;  Location: [facility];  Service: Orthopedics;  Laterality: Right;  Right Knee  Arthroscopy with Excision of Tibial Exostosis, debridement right knee medial menisectomy  . KNEE ARTHROSCOPY  03/21/2012   Procedure: ARTHROSCOPY KNEE;  Surgeon: Eugenia Mcalpine, MD;  Location: [facility];  Service: Orthopedics;  Laterality: Right;  right chondroplasty  . KNEE ARTHROSCOPY W/ MENISCECTOMY  07-08-2011  DR [facility]   RIGHT KNEE  . KNEE SURGERY    . LAPAROSCOPIC CHOLECYSTECTOMY  12-22-2008  . MEDIAL COLLATERAL LIGAMENT REPAIR, KNEE  03/21/2012   Procedure: RECONSTRUCTION MEDIAL COLLATERAL LIGAMENT (MCL);  Surgeon: Eugenia Mcalpine, MD;  Location: [facility];  Service: Orthopedics;  Laterality: Right;  achilles tendon allograft  . ROUX-EN-Y GASTRIC BYPASS  01-22-2008   LAPAROSCOPIC  . TONSILLECTOMY AND ADENOIDECTOMY  1995  . TUBAL LIGATION    . WISDOM TOOTH EXTRACTION  2001    OB History    Gravida  2   Para  2   Term  2   Preterm      AB      Living  2     SAB      TAB      Ectopic      Multiple      Live Births               Home Medications    Prior to Admission medications   Medication Sig Start Date End Date Taking? Authorizing Provider  albuterol (VENTOLIN HFA) 108 (90 Base)  MCG/ACT inhaler Inhale 1-2 puffs into the lungs every 6 (six) hours as needed for wheezing or shortness of breath. 07/30/18   Dahlia Byes A, NP  ALPRAZolam (XANAX) 1 MG tablet Take 1 mg by mouth at bedtime as needed for anxiety.    [provider]  calcium citrate-vitamin D (CITRACAL+D) 315-200 MG-UNIT per tablet Take 2 tablets by mouth 2 (two) times daily.    [provider]  ciprofloxacin (CILOXAN) 0.3 % ophthalmic solution Administer 1 drop, every 2 hours, while awake, for 1 day. Then 1 drop, every 4 hours, while awake, for the next 5 days. 09/06/19   Hall-Potvin, [place], PA-C  Cyanocobalamin (B-12 PO) Place 1 tablet under the tongue daily.     [provider]  EPINEPHrine (EPI-PEN) 0.3 mg/0.3 mL DEVI Inject 0.3 mg into the muscle once as needed. reaction     [provider]  Ferrous Sulfate 220 (44 Fe) MG/5ML LIQD Take 7.5 ml once daily 11/19/18   Fontaine, Nadyne Coombes, MD  gabapentin (NEURONTIN) 300 MG capsule Take 600 mg by mouth 3 (three) times daily. 07/08/16   [provider]  HYDROcodone-acetaminophen (NORCO/VICODIN) 5-325 MG tablet Take 1 tablet by mouth every 6 (six) hours as needed for severe pain. 10/28/19   Janace Aris, NP  levonorgestrel (MIRENA) 20 MCG/24HR IUD 1 each by Intrauterine route once.    [provider]  norethindrone-ethinyl estradiol (JUNEL FE 1/20) 1-20 MG-MCG tablet Take 1 tablet by mouth daily. 03/19/19   Fontaine, Nadyne Coombes, MD  PARoxetine (PAXIL) 20 MG tablet Take 20 mg by mouth daily.    [provider]  predniSONE (STERAPRED UNI-PAK 21 TAB) 10 MG (21) TBPK tablet 6 tabs for 1 day, then 5 tabs for 1 das, then 4 tabs for 1 day, then 3 tabs for 1 day, 2 tabs for 1 day, then 1 tab for 1 day 10/28/19   Janace Aris, NP    Family History Family History  Problem Relation Age of Onset  . Diabetes Father   . Hypertension Father   . Cancer Maternal Grandmother        COLON CANCER  . Diabetes Paternal Grandfather     Social History Social History   Tobacco Use  . Smoking status: Never Smoker  . Smokeless tobacco: Never Used  Vaping Use  . Vaping Use: Never used  Substance Use Topics  . Alcohol use: No    Alcohol/week: 0.0 standard drinks  . Drug use: No     Allergies   Codeine and Shrimp [shellfish allergy]   Review of Systems Review of Systems   Physical Exam Triage Vital Signs ED Triage Vitals  Enc Vitals Group     BP 10/28/19 1144 124/83     Pulse Rate 10/28/19 1144 (!) 108     Resp 10/28/19 1144 18     Temp 10/28/19 1144 97.9 F (36.6 C)     Temp Source 10/28/19 1144 Oral     SpO2 10/28/19 1144 96 %     Weight --      Height --      Head Circumference --      Peak Flow --      Pain Score 10/28/19 1153 7     Pain Loc --      Pain Edu? --      Excl. in  GC? --    No data found.  Updated Vital Signs BP 124/83 (BP Location: Right Arm)   Pulse Marland Kitchen)  108   Temp 97.9 F (36.6 C) (Oral)   Resp 18   SpO2 96%   Visual Acuity Right Eye Distance:   Left Eye Distance:   Bilateral Distance:    Right Eye Near:   Left Eye Near:    Bilateral Near:     Physical Exam Vitals and nursing note reviewed.  Constitutional:      General: She is not in acute distress.    Appearance: Normal appearance. She is not ill-appearing, toxic-appearing or diaphoretic.  HENT:     Head: Normocephalic.     Nose: Nose normal.     Mouth/Throat:     Pharynx: Oropharynx is clear.  Eyes:     Conjunctiva/sclera: Conjunctivae normal.  Cardiovascular:     Pulses:          Dorsalis pedis pulses are 2+ on the right side.  Pulmonary:     Effort: Pulmonary effort is normal.  Musculoskeletal:        General: Normal range of motion.     Cervical back: Normal range of motion.       Feet:  Feet:     Right foot:     Skin integrity: Skin integrity normal. No ulcer, blister, skin breakdown, erythema, warmth, callus, dry skin or fissure.     Comments: TTP.  No swelling, erythema Skin:    General: Skin is warm and dry.     Findings: No rash.  Neurological:     Mental Status: She is alert.  Psychiatric:        Mood and Affect: Mood normal.      UC Treatments / Results  Labs (all labs ordered are listed, but only abnormal results are displayed) Labs Reviewed - No data to display  EKG   Radiology DG Foot Complete Right  Result Date: 10/28/2019 CLINICAL DATA:  Right foot pain for several months. EXAM: RIGHT FOOT COMPLETE - 3+ VIEW COMPARISON:  February 27, 2019. FINDINGS: There is no evidence of fracture or dislocation. There is no evidence of arthropathy or other focal bone abnormality. Soft tissues are unremarkable. IMPRESSION: Negative. Electronically Signed   By: Lupita RaiderJames  Green Jr M.D.   On: 10/28/2019 12:39    Procedures Procedures (including critical  care time)  Medications Ordered in UC Medications  ketorolac (TORADOL) 30 MG/ML injection 30 mg (30 mg Intramuscular Given 10/28/19 1303)    Initial Impression / Assessment and Plan / UC Course  I have reviewed the triage vital signs and the nursing notes.  Pertinent labs & imaging results that were available during my care of the patient were reviewed by me and considered in my medical decision making (see chart for details).     Plantar fasciitis of the right foot Toradol given here for pain.  Will prescribe prednisone taper over the next 6 days.  Hydrocodone for more severe pain.  Rest, ice, elevate. Recommend purchasing more supportive shoes with inserts. Also recommended follow-up with specialist for any continued symptoms Final Clinical Impressions(s) / UC Diagnoses   Final diagnoses:  Plantar fasciitis of right foot     Discharge Instructions     This is most likely plantar fasciitis.  Starting a prednisone taper to take over the next 6 days.  Take this with food.  Toradol given here for pain.  I will give you a few days worth stronger pain medicine until the prednisone starts working.  The best thing for you to do is rest, ice, elevate the foot. You may want  to purchase some more supportive shoes Follow up as needed for continued or worsening symptoms     ED Prescriptions    Medication Sig Dispense Auth. Provider   predniSONE (STERAPRED UNI-PAK 21 TAB) 10 MG (21) TBPK tablet 6 tabs for 1 day, then 5 tabs for 1 das, then 4 tabs for 1 day, then 3 tabs for 1 day, 2 tabs for 1 day, then 1 tab for 1 day 21 tablet Peni Rupard A, NP   HYDROcodone-acetaminophen (NORCO/VICODIN) 5-325 MG tablet Take 1 tablet by mouth every 6 (six) hours as needed for severe pain. 6 tablet Dahlia Byes A, NP     PDMP not reviewed this encounter.   Dahlia Byes A, NP 10/29/19 1428

## 2019-11-13 ENCOUNTER — Emergency Department (HOSPITAL_BASED_OUTPATIENT_CLINIC_OR_DEPARTMENT_OTHER): Payer: 59

## 2019-11-13 ENCOUNTER — Encounter (HOSPITAL_BASED_OUTPATIENT_CLINIC_OR_DEPARTMENT_OTHER): Payer: Self-pay | Admitting: *Deleted

## 2019-11-13 ENCOUNTER — Other Ambulatory Visit: Payer: Self-pay

## 2019-11-13 DIAGNOSIS — N939 Abnormal uterine and vaginal bleeding, unspecified: Secondary | ICD-10-CM | POA: Diagnosis not present

## 2019-11-13 DIAGNOSIS — Y998 Other external cause status: Secondary | ICD-10-CM | POA: Diagnosis not present

## 2019-11-13 DIAGNOSIS — Y92410 Unspecified street and highway as the place of occurrence of the external cause: Secondary | ICD-10-CM | POA: Insufficient documentation

## 2019-11-13 DIAGNOSIS — S39012A Strain of muscle, fascia and tendon of lower back, initial encounter: Secondary | ICD-10-CM | POA: Diagnosis not present

## 2019-11-13 DIAGNOSIS — Y9389 Activity, other specified: Secondary | ICD-10-CM | POA: Diagnosis not present

## 2019-11-13 DIAGNOSIS — S8991XA Unspecified injury of right lower leg, initial encounter: Secondary | ICD-10-CM | POA: Diagnosis not present

## 2019-11-13 DIAGNOSIS — M545 Low back pain: Secondary | ICD-10-CM | POA: Diagnosis present

## 2019-11-13 DIAGNOSIS — Z7951 Long term (current) use of inhaled steroids: Secondary | ICD-10-CM | POA: Diagnosis not present

## 2019-11-13 MED ORDER — NORETHIN ACE-ETH ESTRAD-FE 1-20 MG-MCG PO TABS: 1.0000 | ORAL_TABLET | Freq: Every day | ORAL | 0 refills | Status: DC

## 2019-11-13 NOTE — Telephone Encounter (Signed)
CE scheduled 12/06/19 with Dr. JK. °

## 2019-11-14 ENCOUNTER — Emergency Department (HOSPITAL_BASED_OUTPATIENT_CLINIC_OR_DEPARTMENT_OTHER)
Admit: 2019-11-14 | Discharge: 2019-11-14 | Disposition: A | Discharge status: Home / Self Care | Payer: 59 | Attending: Emergency Medicine | Admitting: Emergency Medicine

## 2019-11-14 ENCOUNTER — Emergency Department (HOSPITAL_BASED_OUTPATIENT_CLINIC_OR_DEPARTMENT_OTHER)
Admission: EM | Admit: 2019-11-14 | Discharge: 2019-11-14 | Disposition: A | Discharge status: Home / Self Care | Payer: 59 | Attending: Emergency Medicine | Admitting: Emergency Medicine

## 2019-11-14 DIAGNOSIS — S8991XA Unspecified injury of right lower leg, initial encounter: Secondary | ICD-10-CM

## 2019-11-14 DIAGNOSIS — N939 Abnormal uterine and vaginal bleeding, unspecified: Secondary | ICD-10-CM

## 2019-11-14 DIAGNOSIS — S39012A Strain of muscle, fascia and tendon of lower back, initial encounter: Secondary | ICD-10-CM

## 2019-11-14 MED ORDER — HYDROCODONE-ACETAMINOPHEN 5-325 MG PO TABS
1.0000 | ORAL_TABLET | Freq: Once | ORAL | Status: AC
  Administered 2019-11-14: 1 via ORAL
  Filled 2019-11-14: qty 1

## 2019-11-14 MED ORDER — HYDROCODONE-ACETAMINOPHEN 5-325 MG PO TABS: 1.0000 | ORAL_TABLET | Freq: Every evening | ORAL | 0 refills | Status: DC | PRN

## 2019-11-14 MED ORDER — MELOXICAM 7.5 MG PO TABS: ORAL_TABLET | ORAL | 0 refills | Status: DC

## 2019-11-14 NOTE — ED Notes (Signed)
EDP at bedside  
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
## 2019-11-23 ENCOUNTER — Other Ambulatory Visit: Payer: Self-pay

## 2019-11-23 ENCOUNTER — Ambulatory Visit
Admission: EM | Admit: 2019-11-23 | Discharge: 2019-11-23 | Disposition: A | Discharge status: Home / Self Care | Payer: 59 | Attending: Physician Assistant | Admitting: Physician Assistant

## 2019-11-23 DIAGNOSIS — Z1152 Encounter for screening for COVID-19: Secondary | ICD-10-CM | POA: Diagnosis not present

## 2019-11-23 DIAGNOSIS — J3489 Other specified disorders of nose and nasal sinuses: Secondary | ICD-10-CM

## 2019-11-23 DIAGNOSIS — J029 Acute pharyngitis, unspecified: Secondary | ICD-10-CM

## 2019-11-23 MED ORDER — LIDOCAINE VISCOUS HCL 2 % MT SOLN: 5.0000 mL | Freq: Four times a day (QID) | OROMUCOSAL | 0 refills | Status: DC | PRN

## 2019-11-23 NOTE — ED Triage Notes (Addendum)
Patient presents with sore throat, cough, runny nose and chills x one day. She is fully vaccinated for COVID.

## 2019-11-23 NOTE — ED Provider Notes (Signed)
[name]    CSN: [id number] Arrival date & time: 11/23/19  4098      History   Chief Complaint Chief Complaint  Patient presents with  . Sore Throat  . Chills  . Otalgia    right side    HPI Gabriella Sawyer is a 39 y.o. female.   39 year old female comes in for 1 day of URI symptoms. Sore throat, cough, rhinorrhea, chills, decreased appetite. Denies fever, body aches. Had nausea prior to current symptom onset that resolved. States chest heaviness without significant shob, wheezing. Still able to ambulate on own. Denies loss of taste/smell. Fully COVID vaccinated.      Past Medical History:  Diagnosis Date  . Arthritis   . Common migraine with intractable migraine 05/17/2017  . LGSIL (low grade squamous intraepithelial dysplasia) 09/2013, 09/2014   Positive high risk HPV screen 2015, negative high risk HPV 2016  . Panic attacks   . PCOS (polycystic ovarian syndrome)   . PONV (postoperative nausea and vomiting)     Patient Active Problem List   Diagnosis Date Noted  . Common migraine with intractable migraine 05/17/2017  . Major depressive disorder, single episode, moderate (HCC) 07/21/2016  . VAIN I (vaginal intraepithelial neoplasia grade I) 07/29/2011  . PCOS (polycystic ovarian syndrome)     Past Surgical History:  Procedure Laterality Date  . CERVICAL BIOPSY  W/ LOOP ELECTRODE EXCISION  11/01/2013   HGSIL on cervical biopsy, LGSIL on LEEP specimen with endocervical margin involvement  . CESAREAN SECTION  12-03-2001  &  03-24-2003   LAST ONE W/ TUBAL LIGATION  . FOOT SURGERY  2000   left foot, screw inserted  . INTRAUTERINE DEVICE INSERTION  01/01/2015   Mirena  . KNEE ARTHROSCOPY  12/16/2011   Procedure: ARTHROSCOPY KNEE;  Surgeon: Javier Docker, MD;  Location: [facility];  Service: Orthopedics;  Laterality: Right;  Right Knee Arthroscopy with Excision of Tibial Exostosis, debridement right knee medial menisectomy  . KNEE ARTHROSCOPY  03/21/2012    Procedure: ARTHROSCOPY KNEE;  Surgeon: Eugenia Mcalpine, MD;  Location: [facility];  Service: Orthopedics;  Laterality: Right;  right chondroplasty  . KNEE ARTHROSCOPY W/ MENISCECTOMY  07-08-2011  [facility]   RIGHT KNEE  . KNEE SURGERY    . LAPAROSCOPIC CHOLECYSTECTOMY  12-22-2008  . MEDIAL COLLATERAL LIGAMENT REPAIR, KNEE  03/21/2012   Procedure: RECONSTRUCTION MEDIAL COLLATERAL LIGAMENT (MCL);  Surgeon: Eugenia Mcalpine, MD;  Location: [facility];  Service: Orthopedics;  Laterality: Right;  achilles tendon allograft  . ROUX-EN-Y GASTRIC BYPASS  01-22-2008   LAPAROSCOPIC  . TONSILLECTOMY AND ADENOIDECTOMY  1995  . TUBAL LIGATION    . WISDOM TOOTH EXTRACTION  2001    OB History    Gravida  2   Para  2   Term  2   Preterm      AB      Living  2     SAB      TAB      Ectopic      Multiple      Live Births              Home Medications    Prior to Admission medications   Medication Sig Start Date End Date Taking? Authorizing Provider  albuterol (VENTOLIN HFA) 108 (90 Base) MCG/ACT inhaler Inhale 1-2 puffs into the lungs every 6 (six) hours as needed for wheezing or shortness of breath. 07/30/18   Janace Aris, NP  ALPRAZolam (  XANAX) 1 MG tablet Take 1 mg by mouth at bedtime as needed for anxiety.    [provider]  calcium citrate-vitamin D (CITRACAL+D) 315-200 MG-UNIT per tablet Take 2 tablets by mouth 2 (two) times daily.    [provider]  Cyanocobalamin (B-12 PO) Place 1 tablet under the tongue daily.     [provider]  EPINEPHrine (EPI-PEN) 0.3 mg/0.3 mL DEVI Inject 0.3 mg into the muscle once as needed. reaction    [provider]  gabapentin (NEURONTIN) 300 MG capsule Take 600 mg by mouth 3 (three) times daily. 07/08/16   [provider]  HYDROcodone-acetaminophen (NORCO) 5-325 MG tablet Take 1 tablet by mouth at bedtime and may repeat dose one time if needed. 11/14/19   Molpus, John, MD  levonorgestrel (MIRENA) 20 MCG/24HR  IUD 1 each by Intrauterine route once.    [provider]  lidocaine (XYLOCAINE) 2 % solution Use as directed 5 mLs in the mouth or throat every 6 (six) hours as needed. 11/23/19   Cathie Hoops, Ericha Whittingham V, PA-C  meloxicam (MOBIC) 7.5 MG tablet Take 1 tablet daily as needed for pain. 11/14/19   Molpus, John, MD  norethindrone-ethinyl estradiol (JUNEL FE 1/20) 1-20 MG-MCG tablet Take 1 tablet by mouth daily. 11/13/19   Theresia Majors, MD  PARoxetine (PAXIL) 20 MG tablet Take 20 mg by mouth daily.    [provider]  Ferrous Sulfate 220 (44 Fe) MG/5ML LIQD Take 7.5 ml once daily 11/19/18 11/23/19  Fontaine, Nadyne Coombes, MD    Family History Family History  Problem Relation Age of Onset  . Diabetes Father   . Hypertension Father   . Cancer Maternal Grandmother        COLON CANCER  . Diabetes Paternal Grandfather     Social History Social History   Tobacco Use  . Smoking status: Never Smoker  . Smokeless tobacco: Never Used  Vaping Use  . Vaping Use: Never used  Substance Use Topics  . Alcohol use: No    Alcohol/week: 0.0 standard drinks  . Drug use: No     Allergies   Codeine and Shrimp [shellfish allergy]   Review of Systems Review of Systems  Reason unable to perform ROS: See HPI as above.     Physical Exam Triage Vital Signs ED Triage Vitals  Enc Vitals Group     BP 11/23/19 1100 116/80     Pulse Rate 11/23/19 1100 69     Resp 11/23/19 1100 18     Temp 11/23/19 1100 97.6 F (36.4 C)     Temp Source 11/23/19 1100 Oral     SpO2 11/23/19 1100 98 %     Weight --      Height --      Head Circumference --      Peak Flow --      Pain Score 11/23/19 1104 9     Pain Loc --      Pain Edu? --      Excl. in GC? --    No data found.  Updated Vital Signs BP 116/80 (BP Location: Left Arm)   Pulse 69   Temp 97.6 F (36.4 C) (Oral)   Resp 18   SpO2 98%   Physical Exam Constitutional:      General: She is not in acute distress.    Appearance: Normal appearance.  She is well-developed. She is not ill-appearing, toxic-appearing or diaphoretic.  HENT:     Head: Normocephalic and atraumatic.  Right Ear: Ear canal and external ear normal.     Left Ear: Ear canal and external ear normal.     Ears:     Comments: Bilateral TM slightly opaque without erythema/bulging.    Mouth/Throat:     Mouth: Mucous membranes are moist.     Pharynx: Oropharynx is clear. Uvula midline. No posterior oropharyngeal erythema.     Tonsils: No tonsillar exudate.  Eyes:     Conjunctiva/sclera: Conjunctivae normal.     Pupils: Pupils are equal, round, and reactive to light.  Cardiovascular:     Rate and Rhythm: Normal rate and regular rhythm.  Pulmonary:     Effort: Pulmonary effort is normal. No accessory muscle usage, prolonged expiration, respiratory distress or retractions.     Breath sounds: No decreased air movement or transmitted upper airway sounds. No decreased breath sounds.     Comments: LCTAB Musculoskeletal:     Cervical back: Normal range of motion and neck supple.  Skin:    General: Skin is warm and dry.  Neurological:     Mental Status: She is alert and oriented to person, place, and time.      UC Treatments / Results  Labs (all labs ordered are listed, but only abnormal results are displayed) Labs Reviewed  NOVEL CORONAVIRUS, NAA    EKG   Radiology No results found.  Procedures Procedures (including critical care time)  Medications Ordered in UC Medications - No data to display  Initial Impression / Assessment and Plan / UC Course  I have reviewed the triage vital signs and the nursing notes.  Pertinent labs & imaging results that were available during my care of the patient were reviewed by me and considered in my medical decision making (see chart for details).     COVID PCR test ordered. Patient to quarantine until testing results return. No alarming signs on exam. LCTAB. Symptomatic treatment discussed.  Push fluids.  Return  precautions given.   Final Clinical Impressions(s) / UC Diagnoses   Final diagnoses:  Encounter for screening for COVID-19  Sore throat  Rhinorrhea   ED Prescriptions    Medication Sig Dispense Auth. Provider   lidocaine (XYLOCAINE) 2 % solution Use as directed 5 mLs in the mouth or throat every 6 (six) hours as needed. 150 mL Cathie Hoops, Cai Flott V, PA-C     I have reviewed the PDMP during this encounter.   Belinda Fisher, PA-C 11/23/19 1223

## 2019-11-23 NOTE — Discharge Instructions (Signed)
COVID PCR testing ordered. I would like you to quarantine until testing results. Start lidocaine for sore throat, do not eat or drink for the next 40 mins after use as it can stunt your gag reflex. You can take over the counter flonase/nasacort to help with nasal congestion/drainage. Tylenol/motrin for pain and fever. Keep hydrated, urine should be clear to pale yellow in color. If experiencing shortness of breath, trouble breathing, go to the emergency department for further evaluation needed.

## 2019-11-24 LAB — SARS-COV-2, NAA 2 DAY TAT

## 2019-11-24 LAB — NOVEL CORONAVIRUS, NAA: SARS-CoV-2, NAA: NOT DETECTED

## 2019-12-06 ENCOUNTER — Encounter: Payer: 59 | Admitting: Obstetrics and Gynecology

## 2019-12-26 ENCOUNTER — Encounter: Payer: 59 | Admitting: Obstetrics and Gynecology

## 2020-01-29 ENCOUNTER — Encounter: Payer: Self-pay | Admitting: Nurse Practitioner

## 2020-01-29 ENCOUNTER — Encounter: Payer: 59 | Admitting: Obstetrics and Gynecology

## 2020-01-29 ENCOUNTER — Other Ambulatory Visit: Payer: Self-pay

## 2020-01-29 ENCOUNTER — Ambulatory Visit (INDEPENDENT_AMBULATORY_CARE_PROVIDER_SITE_OTHER): Payer: 59 | Admitting: Nurse Practitioner

## 2020-01-29 VITALS — BP 122/80 | Ht 63.0 in | Wt 231.0 lb

## 2020-01-29 DIAGNOSIS — F419 Anxiety disorder, unspecified: Secondary | ICD-10-CM | POA: Diagnosis not present

## 2020-01-29 DIAGNOSIS — L680 Hirsutism: Secondary | ICD-10-CM | POA: Diagnosis not present

## 2020-01-29 DIAGNOSIS — Z6841 Body Mass Index (BMI) 40.0 and over, adult: Secondary | ICD-10-CM | POA: Diagnosis not present

## 2020-01-29 DIAGNOSIS — Z01419 Encounter for gynecological examination (general) (routine) without abnormal findings: Secondary | ICD-10-CM | POA: Diagnosis not present

## 2020-01-29 DIAGNOSIS — Z713 Dietary counseling and surveillance: Secondary | ICD-10-CM

## 2020-01-29 DIAGNOSIS — N890 Mild vaginal dysplasia: Secondary | ICD-10-CM

## 2020-01-29 DIAGNOSIS — Z30431 Encounter for routine checking of intrauterine contraceptive device: Secondary | ICD-10-CM | POA: Diagnosis not present

## 2020-01-29 DIAGNOSIS — R877 Abnormal histological findings in specimens from female genital organs: Secondary | ICD-10-CM | POA: Diagnosis not present

## 2020-01-29 DIAGNOSIS — Z8742 Personal history of other diseases of the female genital tract: Secondary | ICD-10-CM

## 2020-01-29 DIAGNOSIS — Z87898 Personal history of other specified conditions: Secondary | ICD-10-CM

## 2020-01-29 LAB — CBC WITH DIFFERENTIAL/PLATELET
Basophils Absolute: 120 cells/uL (ref 0–200)
Eosinophils Relative: 3 %
MPV: 10.6 fL (ref 7.5–12.5)
Monocytes Relative: 8.2 %

## 2020-01-29 MED ORDER — PHENTERMINE HCL 30 MG PO CAPS: 30.0000 mg | ORAL_CAPSULE | ORAL | 0 refills | Status: DC

## 2020-01-29 MED ORDER — ALPRAZOLAM 1 MG PO TABS: 1.0000 mg | ORAL_TABLET | Freq: Every evening | ORAL | 0 refills | Status: AC | PRN
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
## 2020-01-29 NOTE — Patient Instructions (Signed)
Health Maintenance, Female Adopting a healthy lifestyle and getting preventive care are important in promoting health and wellness. Ask your health care provider about:  The right schedule for you to have regular tests and exams.  Things you can do on your own to prevent diseases and keep yourself healthy. What should I know about diet, weight, and exercise? Eat a healthy diet   Eat a diet that includes plenty of vegetables, fruits, low-fat dairy products, and lean protein.  Do not eat a lot of foods that are high in solid fats, added sugars, or sodium. Maintain a healthy weight Body mass index (BMI) is used to identify weight problems. It estimates body fat based on height and weight. Your health care provider can help determine your BMI and help you achieve or maintain a healthy weight. Get regular exercise Get regular exercise. This is one of the most important things you can do for your health. Most adults should:  Exercise for at least 150 minutes each week. The exercise should increase your heart rate and make you sweat (moderate-intensity exercise).  Do strengthening exercises at least twice a week. This is in addition to the moderate-intensity exercise.  Spend less time sitting. Even light physical activity can be beneficial. Watch cholesterol and blood lipids Have your blood tested for lipids and cholesterol at 39 years of age, then have this test every 5 years. Have your cholesterol levels checked more often if:  Your lipid or cholesterol levels are high.  You are older than 40 years of age.  You are at high risk for heart disease. What should I know about cancer screening? Depending on your health history and family history, you may need to have cancer screening at various ages. This may include screening for:  Breast cancer.  Cervical cancer.  Colorectal cancer.  Skin cancer.  Lung cancer. What should I know about heart disease, diabetes, and high blood  pressure? Blood pressure and heart disease  High blood pressure causes heart disease and increases the risk of stroke. This is more likely to develop in people who have high blood pressure readings, are of African descent, or are overweight.  Have your blood pressure checked: ? Every 3-5 years if you are 18-39 years of age. ? Every year if you are 40 years old or older. Diabetes Have regular diabetes screenings. This checks your fasting blood sugar level. Have the screening done:  Once every three years after age 40 if you are at a normal weight and have a low risk for diabetes.  More often and at a younger age if you are overweight or have a high risk for diabetes. What should I know about preventing infection? Hepatitis B If you have a higher risk for hepatitis B, you should be screened for this virus. Talk with your health care provider to find out if you are at risk for hepatitis B infection. Hepatitis C Testing is recommended for:  Everyone born from 1945 through 1965.  Anyone with known risk factors for hepatitis C. Sexually transmitted infections (STIs)  Get screened for STIs, including gonorrhea and chlamydia, if: ? You are sexually active and are younger than 39 years of age. ? You are older than 39 years of age and your health care provider tells you that you are at risk for this type of infection. ? Your sexual activity has changed since you were last screened, and you are at increased risk for chlamydia or gonorrhea. Ask your health care provider if   you are at risk.  Ask your health care provider about whether you are at high risk for HIV. Your health care provider may recommend a prescription medicine to help prevent HIV infection. If you choose to take medicine to prevent HIV, you should first get tested for HIV. You should then be tested every 3 months for as long as you are taking the medicine. Pregnancy  If you are about to stop having your period (premenopausal) and  you may become pregnant, seek counseling before you get pregnant.  Take 400 to 800 micrograms (mcg) of folic acid every day if you become pregnant.  Ask for birth control (contraception) if you want to prevent pregnancy. Osteoporosis and menopause Osteoporosis is a disease in which the bones lose minerals and strength with aging. This can result in bone fractures. If you are 65 years old or older, or if you are at risk for osteoporosis and fractures, ask your health care provider if you should:  Be screened for bone loss.  Take a calcium or vitamin D supplement to lower your risk of fractures.  Be given hormone replacement therapy (HRT) to treat symptoms of menopause. Follow these instructions at home: Lifestyle  Do not use any products that contain nicotine or tobacco, such as cigarettes, e-cigarettes, and chewing tobacco. If you need help quitting, ask your health care provider.  Do not use street drugs.  Do not share needles.  Ask your health care provider for help if you need support or information about quitting drugs. Alcohol use  Do not drink alcohol if: ? Your health care provider tells you not to drink. ? You are pregnant, may be pregnant, or are planning to become pregnant.  If you drink alcohol: ? Limit how much you use to 0-1 drink a day. ? Limit intake if you are breastfeeding.  Be aware of how much alcohol is in your drink. In the [place] one drink equals one 12 oz bottle of beer (355 mL), one 5 oz glass of wine (148 mL), or one 1 oz glass of hard liquor (44 mL). General instructions  Schedule regular health, dental, and eye exams.  Stay current with your vaccines.  Tell your health care provider if: ? You often feel depressed. ? You have ever been abused or do not feel safe at home. Summary  Adopting a healthy lifestyle and getting preventive care are important in promoting health and wellness.  Follow your health care provider's instructions about healthy  diet, exercising, and getting tested or screened for diseases.  Follow your health care provider's instructions on monitoring your cholesterol and blood pressure. This information is not intended to replace advice given to you by your health care provider. Make sure you discuss any questions you have with your health care provider. Document Revised: 03/14/2018 Document Reviewed: 03/14/2018 Elsevier Patient Education  2020 Elsevier Inc.  

## 2020-01-30 ENCOUNTER — Other Ambulatory Visit: Payer: Self-pay

## 2020-01-30 ENCOUNTER — Encounter: Payer: Self-pay | Admitting: *Deleted

## 2020-01-30 DIAGNOSIS — R5383 Other fatigue: Secondary | ICD-10-CM

## 2020-01-30 DIAGNOSIS — Z1321 Encounter for screening for nutritional disorder: Secondary | ICD-10-CM

## 2020-01-30 LAB — CBC WITH DIFFERENTIAL/PLATELET
Absolute Monocytes: 820 cells/uL (ref 200–950)
Basophils Relative: 1.2 %
Eosinophils Absolute: 300 cells/uL (ref 15–500)
HCT: 37.7 % (ref 35.0–45.0)
Hemoglobin: 11.9 g/dL (ref 11.7–15.5)
Lymphs Abs: 3650 cells/uL (ref 850–3900)
MCH: 28.8 pg (ref 27.0–33.0)
MCHC: 31.6 g/dL — ABNORMAL LOW (ref 32.0–36.0)
MCV: 91.3 fL (ref 80.0–100.0)
Neutro Abs: 5110 cells/uL (ref 1500–7800)
Neutrophils Relative %: 51.1 %
Platelets: 470 10*3/uL — ABNORMAL HIGH (ref 140–400)
RBC: 4.13 10*6/uL (ref 3.80–5.10)
RDW: 14.5 % (ref 11.0–15.0)
Total Lymphocyte: 36.5 %
WBC: 10 10*3/uL (ref 3.8–10.8)

## 2020-01-30 LAB — COMPREHENSIVE METABOLIC PANEL
AG Ratio: 1.5 (calc) (ref 1.0–2.5)
ALT: 20 U/L (ref 6–29)
AST: 23 U/L (ref 10–30)
Albumin: 4 g/dL (ref 3.6–5.1)
Alkaline phosphatase (APISO): 138 U/L — ABNORMAL HIGH (ref 31–125)
BUN/Creatinine Ratio: 6 (calc) (ref 6–22)
BUN: 5 mg/dL — ABNORMAL LOW (ref 7–25)
CO2: 19 mmol/L — ABNORMAL LOW (ref 20–32)
Calcium: 8.9 mg/dL (ref 8.6–10.2)
Chloride: 103 mmol/L (ref 98–110)
Creat: 0.79 mg/dL (ref 0.50–1.10)
Globulin: 2.7 g/dL (calc) (ref 1.9–3.7)
Glucose, Bld: 83 mg/dL (ref 65–99)
Potassium: 4.2 mmol/L (ref 3.5–5.3)
Sodium: 139 mmol/L (ref 135–146)
Total Bilirubin: 0.3 mg/dL (ref 0.2–1.2)
Total Protein: 6.7 g/dL (ref 6.1–8.1)

## 2020-01-30 LAB — PAP IG W/ RFLX HPV ASCU

## 2020-01-30 LAB — LIPID PANEL
Cholesterol: 219 mg/dL — ABNORMAL HIGH (ref <200)
HDL: 63 mg/dL (ref >=50)
LDL Cholesterol (Calc): 132 mg/dL (calc) — ABNORMAL HIGH
Non-HDL Cholesterol (Calc): 156 mg/dL (calc) — ABNORMAL HIGH (ref <130)
Total CHOL/HDL Ratio: 3.5 (calc) (ref <5.0)
Triglycerides: 125 mg/dL (ref <150)

## 2020-02-03 LAB — TESTOS,TOTAL,FREE AND SHBG (FEMALE): Sex Hormone Binding: 108 nmol/L (ref 17–124)

## 2020-02-03 NOTE — Telephone Encounter (Signed)
Ok to add this lab order?  What will diagnosis be for this test?

## 2020-02-04 ENCOUNTER — Other Ambulatory Visit: Payer: Self-pay

## 2020-02-04 ENCOUNTER — Other Ambulatory Visit: Payer: Self-pay | Admitting: Obstetrics and Gynecology

## 2020-02-04 DIAGNOSIS — L680 Hirsutism: Secondary | ICD-10-CM

## 2020-02-05 ENCOUNTER — Other Ambulatory Visit: Payer: 59

## 2020-02-05 ENCOUNTER — Other Ambulatory Visit: Payer: Self-pay

## 2020-02-05 DIAGNOSIS — R5383 Other fatigue: Secondary | ICD-10-CM

## 2020-02-05 DIAGNOSIS — Z1321 Encounter for screening for nutritional disorder: Secondary | ICD-10-CM

## 2020-02-05 DIAGNOSIS — L680 Hirsutism: Secondary | ICD-10-CM

## 2020-02-06 ENCOUNTER — Other Ambulatory Visit: Payer: Self-pay | Admitting: Nurse Practitioner

## 2020-02-06 DIAGNOSIS — E559 Vitamin D deficiency, unspecified: Secondary | ICD-10-CM

## 2020-02-06 MED ORDER — VITAMIN D (ERGOCALCIFEROL) 1.25 MG (50000 UNIT) PO CAPS: 50000.0000 [IU] | ORAL_CAPSULE | ORAL | 0 refills | Status: AC

## 2020-02-09 LAB — TESTOS,TOTAL,FREE AND SHBG (FEMALE)
Free Testosterone: 1.2 pg/mL (ref 0.1–6.4)
Sex Hormone Binding: 87 nmol/L (ref 17–124)
Testosterone, Total, LC-MS-MS: 13 ng/dL (ref 2–45)

## 2020-02-09 LAB — GAMMA GT: GGT: 30 U/L (ref 3–50)

## 2020-02-09 LAB — VITAMIN B12: Vitamin B-12: 304 pg/mL (ref 200–1100)

## 2020-02-09 LAB — VITAMIN D 25 HYDROXY (VIT D DEFICIENCY, FRACTURES): Vit D, 25-Hydroxy: 27 ng/mL — ABNORMAL LOW (ref 30–100)

## 2020-02-24 ENCOUNTER — Other Ambulatory Visit: Payer: Self-pay | Admitting: Nurse Practitioner

## 2020-02-24 DIAGNOSIS — L680 Hirsutism: Secondary | ICD-10-CM

## 2020-02-24 DIAGNOSIS — E282 Polycystic ovarian syndrome: Secondary | ICD-10-CM

## 2020-02-24 DIAGNOSIS — L700 Acne vulgaris: Secondary | ICD-10-CM

## 2020-02-24 MED ORDER — SPIRONOLACTONE 25 MG PO TABS: 25.0000 mg | ORAL_TABLET | Freq: Every day | ORAL | 0 refills | Status: DC

## 2020-02-24 MED ORDER — EFLORNITHINE HCL 13.9 % EX CREA: 1.0000 "application " | TOPICAL_CREAM | Freq: Two times a day (BID) | CUTANEOUS | 1 refills | Status: DC

## 2020-02-27 ENCOUNTER — Other Ambulatory Visit: Payer: Self-pay | Admitting: Nurse Practitioner

## 2020-02-27 DIAGNOSIS — E559 Vitamin D deficiency, unspecified: Secondary | ICD-10-CM

## 2020-03-22 ENCOUNTER — Other Ambulatory Visit: Payer: Self-pay | Admitting: Nurse Practitioner

## 2020-03-22 DIAGNOSIS — E559 Vitamin D deficiency, unspecified: Secondary | ICD-10-CM

## 2020-05-01 ENCOUNTER — Other Ambulatory Visit: Payer: Self-pay | Admitting: Nurse Practitioner

## 2020-05-23 ENCOUNTER — Other Ambulatory Visit: Payer: Self-pay | Admitting: Nurse Practitioner

## 2020-05-23 DIAGNOSIS — E282 Polycystic ovarian syndrome: Secondary | ICD-10-CM

## 2020-05-23 DIAGNOSIS — L700 Acne vulgaris: Secondary | ICD-10-CM

## 2020-06-26 ENCOUNTER — Other Ambulatory Visit (HOSPITAL_COMMUNITY)
Admission: RE | Admit: 2020-06-26 | Discharge: 2020-06-26 | Disposition: A | Discharge status: Home / Self Care | Payer: 59 | Source: Ambulatory Visit | Attending: Urgent Care | Admitting: Urgent Care

## 2020-06-26 ENCOUNTER — Other Ambulatory Visit (HOSPITAL_COMMUNITY): Payer: Self-pay | Admitting: Urgent Care

## 2020-06-26 DIAGNOSIS — E282 Polycystic ovarian syndrome: Secondary | ICD-10-CM | POA: Insufficient documentation

## 2020-06-26 LAB — COMPREHENSIVE METABOLIC PANEL
ALT: 14 U/L (ref 0–44)
AST: 15 U/L (ref 15–41)
Albumin: 4 g/dL (ref 3.5–5.0)
Alkaline Phosphatase: 114 U/L (ref 38–126)
Anion gap: 9 (ref 5–15)
BUN: 9 mg/dL (ref 6–20)
CO2: 24 mmol/L (ref 22–32)
Calcium: 8.6 mg/dL — ABNORMAL LOW (ref 8.9–10.3)
Chloride: 109 mmol/L (ref 98–111)
Creatinine, Ser: 0.83 mg/dL (ref 0.44–1.00)
GFR, Estimated: >60 mL/min (ref >60)
Glucose, Bld: 83 mg/dL (ref 70–99)
Potassium: 3.7 mmol/L (ref 3.5–5.1)
Sodium: 142 mmol/L (ref 135–145)
Total Bilirubin: 0.5 mg/dL (ref 0.3–1.2)
Total Protein: 7 g/dL (ref 6.5–8.1)

## 2020-06-26 LAB — IRON AND TIBC
Iron: 35 ug/dL (ref 28–170)
Saturation Ratios: 8 % — ABNORMAL LOW (ref 10.4–31.8)
TIBC: 441 ug/dL (ref 250–450)
UIBC: 406 ug/dL

## 2020-06-26 LAB — HEMOGLOBIN A1C
Hgb A1c MFr Bld: 5.3 % (ref 4.8–5.6)
Mean Plasma Glucose: 105.41 mg/dL

## 2020-06-26 LAB — CBC WITH DIFFERENTIAL/PLATELET
Abs Immature Granulocytes: 0 10*3/uL (ref 0.00–0.07)
Basophils Absolute: 0.1 10*3/uL (ref 0.0–0.1)
Basophils Relative: 1 %
Eosinophils Absolute: 0.1 10*3/uL (ref 0.0–0.5)
Eosinophils Relative: 2 %
HCT: 38.6 % (ref 36.0–46.0)
Hemoglobin: 12.1 g/dL (ref 12.0–15.0)
Immature Granulocytes: 0 %
Lymphocytes Relative: 56 %
Lymphs Abs: 3.9 10*3/uL (ref 0.7–4.0)
MCH: 29.2 pg (ref 26.0–34.0)
MCHC: 31.3 g/dL (ref 30.0–36.0)
MCV: 93.2 fL (ref 80.0–100.0)
Monocytes Absolute: 0.4 10*3/uL (ref 0.1–1.0)
Monocytes Relative: 6 %
Neutro Abs: 2.4 10*3/uL (ref 1.7–7.7)
Neutrophils Relative %: 35 %
Platelets: 348 10*3/uL (ref 150–400)
RBC: 4.14 MIL/uL (ref 3.87–5.11)
RDW: 17.4 % — ABNORMAL HIGH (ref 11.5–15.5)
WBC: 6.9 10*3/uL (ref 4.0–10.5)
nRBC: 0 % (ref 0.0–0.2)

## 2020-06-26 LAB — VITAMIN D 25 HYDROXY (VIT D DEFICIENCY, FRACTURES): Vit D, 25-Hydroxy: 62.7 ng/mL (ref 30–100)

## 2020-06-26 LAB — TSH: TSH: 3.924 u[IU]/mL (ref 0.350–4.500)

## 2020-06-26 LAB — MAGNESIUM: Magnesium: 2.3 mg/dL (ref 1.7–2.4)

## 2020-06-26 LAB — FERRITIN: Ferritin: 7 ng/mL — ABNORMAL LOW (ref 11–307)

## 2020-06-26 LAB — VITAMIN B12: Vitamin B-12: 579 pg/mL (ref 180–914)

## 2020-06-26 LAB — FOLATE: Folate: 10.3 ng/mL (ref >5.9)

## 2020-06-27 LAB — INSULIN, RANDOM: Insulin: 4.2 u[IU]/mL (ref 2.6–24.9)

## 2020-06-27 LAB — TESTOSTERONE: Testosterone: 24 ng/dL (ref 8–60)

## 2020-07-05 ENCOUNTER — Other Ambulatory Visit (HOSPITAL_COMMUNITY): Payer: Self-pay

## 2020-07-06 ENCOUNTER — Other Ambulatory Visit (HOSPITAL_COMMUNITY): Payer: Self-pay

## 2020-07-06 MED FILL — Semaglutide (Weight Mngmt) Soln Auto-Injector 0.25 MG/0.5ML: SUBCUTANEOUS | 30 days supply | Qty: 2 | Fill #0 | Status: AC

## 2020-07-08 LAB — VITAMIN B1: Vitamin B1 (Thiamine): 105.1 nmol/L (ref 66.5–200.0)

## 2020-07-28 ENCOUNTER — Other Ambulatory Visit (HOSPITAL_COMMUNITY): Payer: Self-pay

## 2020-07-28 MED FILL — Semaglutide (Weight Mngmt) Soln Auto-Injector 1 MG/0.5ML: SUBCUTANEOUS | 28 days supply | Qty: 2 | Fill #0 | Status: CN

## 2020-07-29 ENCOUNTER — Other Ambulatory Visit (HOSPITAL_COMMUNITY): Payer: Self-pay

## 2020-07-29 MED FILL — Semaglutide (Weight Mngmt) Soln Auto-Injector 1 MG/0.5ML: SUBCUTANEOUS | 28 days supply | Qty: 2 | Fill #0 | Status: CN

## 2020-07-30 ENCOUNTER — Other Ambulatory Visit (HOSPITAL_COMMUNITY): Payer: Self-pay

## 2020-10-08 ENCOUNTER — Telehealth: Payer: Self-pay | Admitting: Internal Medicine

## 2020-10-08 NOTE — Telephone Encounter (Signed)
Received a new hem referral from [facility] for iron deficiency. Gabriella Sawyer returned my call and has been scheduled to see Dr. Arbutus Ped on 7/12 at 11:45am w/labs at 11:15am. Pt aware to arrive 15 minutes early.

## 2020-10-13 ENCOUNTER — Encounter: Payer: 59 | Admitting: Internal Medicine

## 2020-10-13 ENCOUNTER — Other Ambulatory Visit: Payer: 59

## 2021-02-10 ENCOUNTER — Encounter: Payer: Self-pay | Admitting: Nurse Practitioner

## 2021-02-10 ENCOUNTER — Ambulatory Visit (INDEPENDENT_AMBULATORY_CARE_PROVIDER_SITE_OTHER): Payer: 59 | Admitting: Nurse Practitioner

## 2021-02-10 ENCOUNTER — Other Ambulatory Visit: Payer: Self-pay

## 2021-02-10 VITALS — BP 116/74 | Ht 62.0 in | Wt 221.0 lb

## 2021-02-10 DIAGNOSIS — Z01419 Encounter for gynecological examination (general) (routine) without abnormal findings: Secondary | ICD-10-CM | POA: Diagnosis not present

## 2021-02-10 NOTE — Progress Notes (Signed)
   Gabriella Sawyer 1981/02/02 [id number]   History:  40 y.o. G2P2002 presents for annual exam. Amenorrheic. Mirena IUD 12/2014.  2013 VAIN-1. 2015 HGSIL with LEEP with positive endocervical margin with negative follow up ECC. 2016 LGSIL with negative biopsy, subsequent paps normal. History of PCOS. Anemia managed by hematology, currently on iron daily.  Gynecologic History No LMP recorded. (Menstrual status: IUD).   Contraception/Family planning: IUD, BTL Sexually active: Yes  Health Maintenance Last Pap: 01/29/2020. Results were: Normal, 3-year repeat Last mammogram: Never Last colonoscopy: Not indicated Last Dexa: Not indicated  Past medical history, past surgical history, family history and social history were all reviewed and documented in the EPIC chart. Married. Pharmacy tech. 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 MGM with history of colon cancer.   ROS:  A ROS was performed and pertinent positives and negatives are included.  Exam:  Vitals:   02/10/21 1528  BP: 116/74  Weight: 221 lb (100.2 kg)  Height: 5\' 2"  (1.575 m)    Body mass index is 40.42 kg/m.  General appearance:  Normal Thyroid:  Symmetrical, normal in size, without palpable masses or nodularity. Respiratory  Auscultation:  Clear without wheezing or rhonchi Cardiovascular  Auscultation:  Regular rate, without rubs, murmurs or gallops  Edema/varicosities:  Not grossly evident Abdominal  Soft,nontender, without masses, guarding or rebound.  Liver/spleen:  No organomegaly noted  Hernia:  None appreciated  Skin  Inspection:  Grossly normal   Breasts: Examined lying and sitting.   Right: Without masses, retractions, discharge or axillary adenopathy.   Left: Without masses, retractions, discharge or axillary adenopathy. Gentitourinary   Inguinal/mons:  Normal without inguinal adenopathy  External genitalia:  Normal  BUS/Urethra/Skene's glands:  Normal  Vagina:  Normal  Cervix:  Normal, IUD  string visible  Uterus:  Difficult to palpate but no gross masses or tenderness  Adnexa/parametria:     Rt: Without masses or tenderness.   Lt: Without masses or tenderness.  Anus and perineum: Normal  Patient informed chaperone available to be present for breast and pelvic exam. Patient has requested no chaperone to be present. Patient has been advised what will be completed during breast and pelvic exam.   Assessment/Plan:  40 y.o. 41 for annual exam.   Well female exam with routine gynecological exam - Education provided on SBEs, importance of preventative screenings, current guidelines, high calcium diet, regular exercise, and multivitamin daily. Labs with PCP.   Screening for cervical cancer - 2015 HGSIL with LEEP with positive endocervical margin with negative follow up ECC. 2016 LGSIL with negative biopsy, subsequent paps normal. >3 normal paps since LEEP. Will return to 3-year interval.   Screening for breast cancer - Normal breast exam today. Discussed current guidelines and importance of preventative screenings. Provided her with information for [facility].   Follow up in 1 year for annual.       2017 [facility], 3:52 PM 02/10/2021

## 2021-02-11 ENCOUNTER — Other Ambulatory Visit: Payer: Self-pay | Admitting: Nurse Practitioner

## 2021-02-11 DIAGNOSIS — Z1231 Encounter for screening mammogram for malignant neoplasm of breast: Secondary | ICD-10-CM

## 2021-03-19 ENCOUNTER — Ambulatory Visit: Payer: 59

## 2021-03-21 ENCOUNTER — Other Ambulatory Visit: Payer: Self-pay

## 2021-03-21 ENCOUNTER — Ambulatory Visit (INDEPENDENT_AMBULATORY_CARE_PROVIDER_SITE_OTHER): Payer: 59

## 2021-03-21 ENCOUNTER — Encounter (HOSPITAL_COMMUNITY): Payer: Self-pay

## 2021-03-21 ENCOUNTER — Ambulatory Visit (HOSPITAL_COMMUNITY)
Admission: EM | Admit: 2021-03-21 | Discharge: 2021-03-21 | Disposition: A | Discharge status: Home / Self Care | Payer: 59 | Attending: Family Medicine | Admitting: Family Medicine

## 2021-03-21 DIAGNOSIS — J069 Acute upper respiratory infection, unspecified: Secondary | ICD-10-CM | POA: Insufficient documentation

## 2021-03-21 DIAGNOSIS — R059 Cough, unspecified: Secondary | ICD-10-CM | POA: Diagnosis not present

## 2021-03-21 LAB — POCT RAPID STREP A, ED / UC: Streptococcus, Group A Screen (Direct): NEGATIVE

## 2021-03-21 NOTE — Discharge Instructions (Addendum)
Use cepacol for your sore throat. Rest and drink plenty of fluids. Use a humidifier. Our staff will call if the culture is positive for strep, as you would need an antibiotic(the rapid test here was negative.)

## 2021-03-21 NOTE — ED Triage Notes (Signed)
Pt presents with sore throat and congestion for x 1 week. She has been taken OTC medication with no relief. Son tested positive for flu a couple weeks ago.

## 2021-03-21 NOTE — ED Triage Notes (Signed)
Pt is here for cough and sore throat.

## 2021-03-21 NOTE — ED Provider Notes (Signed)
[facility]    CSN: [id number] Arrival date & time: 03/21/21  1037      History   Chief Complaint Chief Complaint  Patient presents with   Sore Throat    Congestion     HPI Gabriella Sawyer is a 40 y.o. female.    Sore Throat  Here for 1 week h/o sore throat (feels like glass), cough, congestion, subjective fever and chills. Also has had some nausea sometimes, and has had pressure in her ears.  Chest feels heavy.  Son had the flu 2 weeks ago.  Past Medical History:  Diagnosis Date   Arthritis    Common migraine with intractable migraine 05/17/2017   LGSIL (low grade squamous intraepithelial dysplasia) 09/2013, 09/2014   Positive high risk HPV screen 2015, negative high risk HPV 2016   Panic attacks    PCOS (polycystic ovarian syndrome)    PONV (postoperative nausea and vomiting)     Patient Active Problem List   Diagnosis Date Noted   Common migraine with intractable migraine 05/17/2017   Major depressive disorder, single episode, moderate (HCC) 07/21/2016   VAIN I (vaginal intraepithelial neoplasia grade I) 07/29/2011   PCOS (polycystic ovarian syndrome)     Past Surgical History:  Procedure Laterality Date   CERVICAL BIOPSY  W/ LOOP ELECTRODE EXCISION  11/01/2013   HGSIL on cervical biopsy, LGSIL on LEEP specimen with endocervical margin involvement   CESAREAN SECTION  12-03-2001  &  03-24-2003   LAST ONE W/ TUBAL LIGATION   FOOT SURGERY  2000   left foot, screw inserted   INTRAUTERINE DEVICE INSERTION  01/01/2015   Mirena   KNEE ARTHROSCOPY  12/16/2011   Procedure: ARTHROSCOPY KNEE;  Surgeon: Javier Docker, MD;  Location: [facility];  Service: Orthopedics;  Laterality: Right;  Right Knee Arthroscopy with Excision of Tibial Exostosis, debridement right knee medial menisectomy   KNEE ARTHROSCOPY  03/21/2012   Procedure: ARTHROSCOPY KNEE;  Surgeon: Eugenia Mcalpine, MD;  Location: [facility];  Service: Orthopedics;  Laterality: Right;  right chondroplasty    KNEE ARTHROSCOPY W/ MENISCECTOMY  07-08-2011  [facility]   RIGHT KNEE   KNEE SURGERY     LAPAROSCOPIC CHOLECYSTECTOMY  12-22-2008   MEDIAL COLLATERAL LIGAMENT REPAIR, KNEE  03/21/2012   Procedure: RECONSTRUCTION MEDIAL COLLATERAL LIGAMENT (MCL);  Surgeon: Eugenia Mcalpine, MD;  Location: [facility];  Service: Orthopedics;  Laterality: Right;  achilles tendon allograft   ROUX-EN-Y GASTRIC BYPASS  01-22-2008   LAPAROSCOPIC   TONSILLECTOMY AND ADENOIDECTOMY  1995   TUBAL LIGATION     WISDOM TOOTH EXTRACTION  2001    OB History     Gravida  2   Para  2   Term  2   Preterm      AB      Living  2      SAB      IAB      Ectopic      Multiple      Live Births               Home Medications    Prior to Admission medications   Medication Sig Start Date End Date Taking? Authorizing Provider  ALPRAZolam Prudy Feeler) 1 MG tablet Take 1 tablet (1 mg total) by mouth at bedtime as needed for anxiety. Patient not taking: Reported on 02/10/2021 01/29/20   Wyline Beady A, NP  busPIRone (BUSPAR) 15 MG tablet Take 15 mg by mouth 2 (two) times daily.  [provider]  calcium citrate-vitamin D (CITRACAL+D) 315-200 MG-UNIT per tablet Take 2 tablets by mouth 2 (two) times daily.    [provider]  clonazePAM (KLONOPIN) 0.5 MG tablet Take 0.5 mg by mouth 3 (three) times daily as needed. 02/02/21   [provider]  Cyanocobalamin (B-12 PO) Place 1 tablet under the tongue daily.     [provider]  Eflornithine HCl 13.9 % cream Apply 1 application topically 2 (two) times daily. Patient not taking: Reported on 02/10/2021 02/24/20   Wyline Beady A, NP  EPINEPHrine (EPI-PEN) 0.3 mg/0.3 mL DEVI Inject 0.3 mg into the muscle once as needed. reaction    [provider]  ferrous sulfate 325 (65 FE) MG tablet Take 325 mg by mouth daily with breakfast.    [provider]  gabapentin (NEURONTIN) 300 MG capsule 4 tabs 4 times per day Patient not  taking: Reported on 02/10/2021 07/08/16   [provider]  levonorgestrel (MIRENA) 20 MCG/24HR IUD 1 each by Intrauterine route once.    [provider]  phentermine 30 MG capsule Take 1 capsule (30 mg total) by mouth every morning. Patient not taking: Reported on 02/10/2021 01/29/20   Olivia Mackie, NP  Semaglutide-Weight Management 0.25 MG/0.5ML SOAJ INJECT 0.25 MG UNDER THE SKIN ONCE A WEEK Patient not taking: Reported on 02/10/2021 06/26/20 06/26/21  Maretta Bees, PA  Semaglutide-Weight Management 1 MG/0.5ML SOAJ INJECT 1MG  UNDER THE SKIN ONCE A WEEK (START AFTER COMPLETING 4 DOSES OF 0.5MG ) Patient not taking: Reported on 02/10/2021 06/26/20 06/26/21  06/28/21 L, PA  tapentadol (NUCYNTA) 50 MG tablet Take 50 mg by mouth every 4 (four) hours as needed.    [provider]    Family History Family History  Problem Relation Age of Onset   Diabetes Father    Hypertension Father    Cancer Maternal Grandmother        COLON CANCER   Diabetes Paternal Grandfather     Social History Social History   Tobacco Use   Smoking status: Never   Smokeless tobacco: Never  Vaping Use   Vaping Use: Never used  Substance Use Topics   Alcohol use: No    Alcohol/week: 0.0 standard drinks   Drug use: No     Allergies   Codeine and Shrimp [shellfish allergy]   Review of Systems Review of Systems   Physical Exam Triage Vital Signs ED Triage Vitals  Enc Vitals Group     BP 03/21/21 1217 121/84     Pulse Rate 03/21/21 1217 96     Resp 03/21/21 1217 16     Temp 03/21/21 1217 98.5 F (36.9 C)     Temp Source 03/21/21 1217 Oral     SpO2 03/21/21 1217 100 %     Weight --      Height --      Head Circumference --      Peak Flow --      Pain Score 03/21/21 1218 8     Pain Loc --      Pain Edu? --      Excl. in GC? --    No data found.  Updated Vital Signs BP 121/84 (BP Location: Right Arm)    Pulse 96    Temp 98.5 F (36.9 C) (Oral)    Resp 16     SpO2 100%   Visual Acuity Right Eye Distance:   Left Eye Distance:   Bilateral Distance:  Right Eye Near:   Left Eye Near:    Bilateral Near:     Physical Exam Constitutional:      General: She is not in acute distress.    Appearance: She is not toxic-appearing.  HENT:     Right Ear: Tympanic membrane and ear canal normal.     Left Ear: Tympanic membrane and ear canal normal.     Nose: Congestion present.     Mouth/Throat:     Mouth: Mucous membranes are moist.     Pharynx: Oropharyngeal exudate (clear mucus and erythema posteriorly) present.  Eyes:     Extraocular Movements: Extraocular movements intact.     Conjunctiva/sclera: Conjunctivae normal.     Pupils: Pupils are equal, round, and reactive to light.  Cardiovascular:     Rate and Rhythm: Normal rate and regular rhythm.     Heart sounds: No murmur heard. Pulmonary:     Breath sounds: No stridor. No wheezing, rhonchi or rales.  Musculoskeletal:     Cervical back: Neck supple.  Lymphadenopathy:     Cervical: No cervical adenopathy.  Skin:    Capillary Refill: Capillary refill takes less than 2 seconds.     Coloration: Skin is not jaundiced or pale.  Neurological:     General: No focal deficit present.     Mental Status: She is alert and oriented to person, place, and time.  Psychiatric:        Behavior: Behavior normal.     UC Treatments / Results  Labs (all labs ordered are listed, but only abnormal results are displayed) Labs Reviewed  CULTURE, GROUP A STREP [facility])  POCT RAPID STREP A, ED / UC    EKG   Radiology DG Chest 2 View  Result Date: 03/21/2021 CLINICAL DATA:  Cough for 1 week. EXAM: CHEST - 2 VIEW COMPARISON:  Chest radiograph 07/30/2018 FINDINGS: The cardiomediastinal contours are within normal limits. The lungs are clear. No pneumothorax or pleural effusion. No acute finding in the visualized skeleton. IMPRESSION: No acute cardiopulmonary process. Electronically Signed   By: Audie Pinto M.D.   On: 03/21/2021 13:41    Procedures Procedures (including critical care time)  Medications Ordered in UC Medications - No data to display  Initial Impression / Assessment and Plan / UC Course  I have reviewed the triage vital signs and the nursing notes.  Pertinent labs & imaging results that were available during my care of the patient were reviewed by me and considered in my medical decision making (see chart for details).     Strep test is negative. CXR clear also. Backup culture is sent Final Clinical Impressions(s) / UC Diagnoses   Final diagnoses:  Viral upper respiratory tract infection     Discharge Instructions      Use cepacol for your sore throat. Rest and drink plenty of fluids. Use a humidifier. Our staff will call if the culture is positive for strep, as you would need an antibiotic(the rapid test here was negative.)     ED Prescriptions   None    PDMP not reviewed this encounter.   Barrett Henle, MD 03/21/21 [phone]

## 2021-03-23 LAB — CULTURE, GROUP A STREP (THRC)

## 2021-08-06 ENCOUNTER — Ambulatory Visit
Admission: RE | Admit: 2021-08-06 | Discharge: 2021-08-06 | Disposition: A | Discharge status: Home / Self Care | Payer: 59 | Source: Ambulatory Visit | Attending: Nurse Practitioner | Admitting: Nurse Practitioner

## 2021-08-06 DIAGNOSIS — Z1231 Encounter for screening mammogram for malignant neoplasm of breast: Secondary | ICD-10-CM

## 2022-03-05 ENCOUNTER — Emergency Department (HOSPITAL_COMMUNITY): Payer: BC Managed Care – PPO

## 2022-03-05 ENCOUNTER — Emergency Department (HOSPITAL_COMMUNITY)
Admission: EM | Admit: 2022-03-05 | Discharge: 2022-03-06 | Disposition: A | Discharge status: Home / Self Care | Payer: BC Managed Care – PPO | Attending: Emergency Medicine | Admitting: Emergency Medicine

## 2022-03-05 ENCOUNTER — Other Ambulatory Visit: Payer: Self-pay

## 2022-03-05 DIAGNOSIS — N9489 Other specified conditions associated with female genital organs and menstrual cycle: Secondary | ICD-10-CM | POA: Insufficient documentation

## 2022-03-05 DIAGNOSIS — R072 Precordial pain: Secondary | ICD-10-CM | POA: Diagnosis not present

## 2022-03-05 DIAGNOSIS — R079 Chest pain, unspecified: Secondary | ICD-10-CM

## 2022-03-05 LAB — CBC WITH DIFFERENTIAL/PLATELET
Abs Immature Granulocytes: 0.04 10*3/uL (ref 0.00–0.07)
Basophils Absolute: 0.1 10*3/uL (ref 0.0–0.1)
Basophils Relative: 1 %
Eosinophils Absolute: 0.1 10*3/uL (ref 0.0–0.5)
Eosinophils Relative: 1 %
HCT: 47.1 % — ABNORMAL HIGH (ref 36.0–46.0)
Hemoglobin: 15 g/dL (ref 12.0–15.0)
Immature Granulocytes: 0 %
Lymphocytes Relative: 33 %
Lymphs Abs: 4.5 10*3/uL — ABNORMAL HIGH (ref 0.7–4.0)
MCH: 33.2 pg (ref 26.0–34.0)
MCHC: 31.8 g/dL (ref 30.0–36.0)
MCV: 104.2 fL — ABNORMAL HIGH (ref 80.0–100.0)
Monocytes Absolute: 1.1 10*3/uL — ABNORMAL HIGH (ref 0.1–1.0)
Monocytes Relative: 8 %
Neutro Abs: 7.9 10*3/uL — ABNORMAL HIGH (ref 1.7–7.7)
Neutrophils Relative %: 57 %
Platelets: 386 10*3/uL (ref 150–400)
RBC: 4.52 MIL/uL (ref 3.87–5.11)
RDW: 13 % (ref 11.5–15.5)
WBC: 13.7 10*3/uL — ABNORMAL HIGH (ref 4.0–10.5)
nRBC: 0 % (ref 0.0–0.2)

## 2022-03-05 LAB — HCG, SERUM, QUALITATIVE: Preg, Serum: NEGATIVE

## 2022-03-05 MED ORDER — NITROGLYCERIN 0.4 MG SL SUBL: 0.4000 mg | SUBLINGUAL_TABLET | SUBLINGUAL | Status: DC | PRN

## 2022-03-05 MED ORDER — FENTANYL CITRATE PF 50 MCG/ML IJ SOSY
50.0000 ug | PREFILLED_SYRINGE | Freq: Once | INTRAMUSCULAR | Status: AC
  Administered 2022-03-05: 50 ug via INTRAVENOUS
  Filled 2022-03-05: qty 1

## 2022-03-05 MED ORDER — ASPIRIN 81 MG PO CHEW
324.0000 mg | CHEWABLE_TABLET | Freq: Once | ORAL | Status: AC
  Administered 2022-03-05: 324 mg via ORAL
  Filled 2022-03-05: qty 4

## 2022-03-05 MED ORDER — HYDROMORPHONE HCL 1 MG/ML IJ SOLN: 1.0000 mg | Freq: Once | INTRAMUSCULAR | Status: DC

## 2022-03-05 NOTE — ED Provider Notes (Signed)
[facility]-EMERGENCY DEPT Provider Note   CSN: [id number] Arrival date & time: 03/05/22  2142     History {Add pertinent medical, surgical, social history, OB history to HPI:1} Chief Complaint  Patient presents with   Chest Pain    Gabriella Sawyer is a 41 y.o. female.  41 year old female presents to the emergency department for evaluation of constant sharp midsternal chest pain which began around 1800 tonight.  It gradually became worse and is now severe.  She feels as though she needs to push on the center of her chest to try and alleviate her symptoms.  Cannot state whether or not the pain is worse with breathing.  The pain does radiate to her left shoulder.  She has not had any nausea, vomiting, diaphoresis, back pain, lower extremity paresthesias or weakness.  No personal history of hypertension, hyperlipidemia, diabetes.  No known family history of heart disease.  She is a nonsmoker.  Abdominal surgical history significant for Roux-en-Y bypass (2009), cholecystectomy, C-section.  The history is provided by the patient and the spouse. No language interpreter was used.  Chest Pain      Home Medications Prior to Admission medications   Medication Sig Start Date End Date Taking? Authorizing Provider  ALPRAZolam Prudy Feeler) 1 MG tablet Take 1 tablet (1 mg total) by mouth at bedtime as needed for anxiety. Patient not taking: Reported on 02/10/2021 01/29/20   Wyline Beady A, NP  busPIRone (BUSPAR) 15 MG tablet Take 15 mg by mouth 2 (two) times daily.    [provider]  calcium citrate-vitamin D (CITRACAL+D) 315-200 MG-UNIT per tablet Take 2 tablets by mouth 2 (two) times daily.    [provider]  clonazePAM (KLONOPIN) 0.5 MG tablet Take 0.5 mg by mouth 3 (three) times daily as needed. 02/02/21   [provider]  Cyanocobalamin (B-12 PO) Place 1 tablet under the tongue daily.     [provider]  Eflornithine HCl 13.9 % cream Apply  1 application topically 2 (two) times daily. Patient not taking: Reported on 02/10/2021 02/24/20   Wyline Beady A, NP  EPINEPHrine (EPI-PEN) 0.3 mg/0.3 mL DEVI Inject 0.3 mg into the muscle once as needed. reaction    [provider]  ferrous sulfate 325 (65 FE) MG tablet Take 325 mg by mouth daily with breakfast.    [provider]  gabapentin (NEURONTIN) 300 MG capsule 4 tabs 4 times per day Patient not taking: Reported on 02/10/2021 07/08/16   [provider]  levonorgestrel (MIRENA) 20 MCG/24HR IUD 1 each by Intrauterine route once.    [provider]  phentermine 30 MG capsule Take 1 capsule (30 mg total) by mouth every morning. Patient not taking: Reported on 02/10/2021 01/29/20   Wyline Beady A, NP  tapentadol (NUCYNTA) 50 MG tablet Take 50 mg by mouth every 4 (four) hours as needed.    [provider]      Allergies    Codeine and Shrimp [shellfish allergy]    Review of Systems   Review of Systems  Cardiovascular:  Positive for chest pain.  Ten systems reviewed and are negative for acute change, except as noted in the HPI.    Physical Exam Updated Vital Signs BP (!) 190/160 (BP Location: Left Arm)   Pulse 79   Temp 97.8 F (36.6 C) (Oral)   Resp 18   Ht 5\' 2"  (1.575 m)   Wt 100.2 kg   SpO2 100%   BMI 40.42 kg/m  Physical Exam Vitals and nursing note reviewed.  Constitutional:      General: She is in acute distress.     Appearance: She is obese. She is not toxic-appearing.     Comments: Mild distress, tearful. Lying prone on the bed with flexed hips. Appears uncomfortable.  HENT:     Head: Normocephalic and atraumatic.     Right Ear: External ear normal.     Left Ear: External ear normal.  Cardiovascular:     Rate and Rhythm: Normal rate and regular rhythm.     Pulses: Normal pulses.  Pulmonary:     Breath sounds: No stridor. No wheezing or rales.     Comments: Breath sounds CTAB. Chest rise  symmetric. Abdominal:     Palpations: Abdomen is soft.     Comments: Difficult exam due to positioning of the patient, but no obvious tenderness to palpation diffusely to the abdomen.  Abdomen is soft and obese without peritoneal signs.  Musculoskeletal:     Comments: No pitting edema in BLE  Neurological:     Mental Status: She is alert.     Coordination: Coordination normal.  Psychiatric:        Mood and Affect: Mood is anxious. Affect is tearful.     ED Results / Procedures / Treatments   Labs (all labs ordered are listed, but only abnormal results are displayed) Labs Reviewed  CBC WITH DIFFERENTIAL/PLATELET - Abnormal; Notable for the following components:      Result Value   WBC 13.7 (*)    HCT 47.1 (*)    MCV 104.2 (*)    Neutro Abs 7.9 (*)    Lymphs Abs 4.5 (*)    Monocytes Absolute 1.1 (*)    All other components within normal limits  COMPREHENSIVE METABOLIC PANEL  LIPASE, BLOOD  I-STAT BETA HCG BLOOD, ED ([facility], [facility], [facility] ONLY)  TROPONIN I (HIGH SENSITIVITY)    EKG None  Radiology No results found.  Procedures Procedures  {Document cardiac monitor, telemetry assessment procedure when appropriate:1}  Medications Ordered in ED Medications  aspirin chewable tablet 324 mg (has no administration in time range)  nitroGLYCERIN (NITROSTAT) SL tablet 0.4 mg (has no administration in time range)  fentaNYL (SUBLIMAZE) injection 50 mcg (has no administration in time range)    ED Course/ Medical Decision Making/ A&P                           Medical Decision Making Risk Prescription drug management.   ***  {Document critical care time when appropriate:1} {Document review of labs and clinical decision tools ie heart score, Chads2Vasc2 etc:1}  {Document your independent review of radiology images, and any outside records:1} {Document your discussion with family members, caretakers, and with consultants:1} {Document social determinants of health affecting pt's  care:1} {Document your decision making why or why not admission, treatments were needed:1} Final Clinical Impression(s) / ED Diagnoses Final diagnoses:  None    Rx / DC Orders ED Discharge Orders     None

## 2022-03-05 NOTE — ED Triage Notes (Signed)
Pt reports onset of right sided chest pain and left shoulder pain, onset around 1800 tonight associated with shortness of breath. No meds prior to arrival for the same

## 2022-03-05 NOTE — ED Provider Triage Note (Signed)
Emergency Medicine Provider Triage Evaluation Note  Gabriella Sawyer , a 41 y.o. female  was evaluated in triage.  She has a history of Roux-en-Y surgery.  Pt complains of chest pain and shortness of breath starting around 6PM.  She describes the pain as sharp in the mid chest with radiation to the left shoulder.  No associated vomiting or diaphoresis.  No back pain.  She has never had pain like this before.  Denies history of hypertension, high cholesterol, diabetes.  No family history of heart disease.  No lower extremity swelling.  Review of Systems  Positive: Chest pain, shortness of breath Negative: Fever  Physical Exam  BP (!) 190/160 (BP Location: Left Arm)   Pulse 79   Temp 97.8 F (36.6 C) (Oral)   Resp 18   Ht 5\' 2"  (1.575 m)   Wt 100.2 kg   SpO2 100%   BMI 40.42 kg/m  Gen:   Awake, she appears extremely uncomfortable Resp:  Hyperventilating MSK:   Moves extremities without difficulty  Other:  Normal pulses, regular rhythm.  Medical Decision Making  Medically screening exam initiated at 9:57 PM.  Appropriate orders placed.  LORAH KALINA was informed that the remainder of the evaluation will be completed by another provider, this initial triage assessment does not replace that evaluation, and the importance of remaining in the ED until their evaluation is complete.  Reviewed EKGs, there is significant baseline wander due to patient's hyperventilation.  No obvious STEMI.   Jenne Campus, PA-C 03/05/22 2159

## 2022-03-06 ENCOUNTER — Emergency Department (HOSPITAL_COMMUNITY): Payer: BC Managed Care – PPO

## 2022-03-06 ENCOUNTER — Encounter (HOSPITAL_COMMUNITY): Payer: Self-pay

## 2022-03-06 DIAGNOSIS — R072 Precordial pain: Secondary | ICD-10-CM | POA: Diagnosis not present

## 2022-03-06 LAB — COMPREHENSIVE METABOLIC PANEL
ALT: 8 U/L (ref 0–44)
AST: 15 U/L (ref 15–41)
Albumin: 3.8 g/dL (ref 3.5–5.0)
Alkaline Phosphatase: 90 U/L (ref 38–126)
Anion gap: 14 (ref 5–15)
BUN: 10 mg/dL (ref 6–20)
CO2: 20 mmol/L — ABNORMAL LOW (ref 22–32)
Calcium: 8.8 mg/dL — ABNORMAL LOW (ref 8.9–10.3)
Chloride: 107 mmol/L (ref 98–111)
Creatinine, Ser: 0.95 mg/dL (ref 0.44–1.00)
GFR, Estimated: >60 mL/min (ref >60)
Glucose, Bld: 73 mg/dL (ref 70–99)
Potassium: 3.5 mmol/L (ref 3.5–5.1)
Sodium: 141 mmol/L (ref 135–145)
Total Bilirubin: 0.5 mg/dL (ref 0.3–1.2)
Total Protein: 7 g/dL (ref 6.5–8.1)

## 2022-03-06 LAB — LIPASE, BLOOD: Lipase: 35 U/L (ref 11–51)

## 2022-03-06 LAB — TROPONIN I (HIGH SENSITIVITY)
Troponin I (High Sensitivity): 2 ng/L (ref <18)
Troponin I (High Sensitivity): 3 ng/L (ref <18)

## 2022-03-06 MED ORDER — KETOROLAC TROMETHAMINE 15 MG/ML IJ SOLN
15.0000 mg | Freq: Once | INTRAMUSCULAR | Status: AC
  Administered 2022-03-06: 15 mg via INTRAVENOUS
  Filled 2022-03-06: qty 1

## 2022-03-06 MED ORDER — PANTOPRAZOLE SODIUM 40 MG IV SOLR
40.0000 mg | Freq: Once | INTRAVENOUS | Status: AC
  Administered 2022-03-06: 40 mg via INTRAVENOUS
  Filled 2022-03-06: qty 10

## 2022-03-06 MED ORDER — IOHEXOL 350 MG/ML SOLN
100.0000 mL | Freq: Once | INTRAVENOUS | Status: AC | PRN
  Administered 2022-03-06: 100 mL via INTRAVENOUS

## 2022-03-06 MED ORDER — ALUM & MAG HYDROXIDE-SIMETH 200-200-20 MG/5ML PO SUSP
30.0000 mL | Freq: Once | ORAL | Status: AC
  Administered 2022-03-06: 30 mL via ORAL
  Filled 2022-03-06: qty 30

## 2022-03-06 MED ORDER — LIDOCAINE VISCOUS HCL 2 % MT SOLN
15.0000 mL | Freq: Once | OROMUCOSAL | Status: AC
  Administered 2022-03-06: 15 mL via ORAL
  Filled 2022-03-06: qty 15

## 2022-03-06 NOTE — Discharge Instructions (Addendum)
Your workup in the emergency department today was reassuring.  We recommend follow-up with a primary care doctor for further evaluation, reassessment.  Return to the ED for any new or concerning symptoms.

## 2022-03-29 ENCOUNTER — Emergency Department (HOSPITAL_BASED_OUTPATIENT_CLINIC_OR_DEPARTMENT_OTHER): Payer: BC Managed Care – PPO | Admitting: Radiology

## 2022-03-29 ENCOUNTER — Other Ambulatory Visit: Payer: Self-pay

## 2022-03-29 ENCOUNTER — Encounter (HOSPITAL_BASED_OUTPATIENT_CLINIC_OR_DEPARTMENT_OTHER): Payer: Self-pay

## 2022-03-29 DIAGNOSIS — U071 COVID-19: Secondary | ICD-10-CM | POA: Insufficient documentation

## 2022-03-29 DIAGNOSIS — R059 Cough, unspecified: Secondary | ICD-10-CM | POA: Diagnosis present

## 2022-03-29 LAB — BASIC METABOLIC PANEL
Anion gap: 7 (ref 5–15)
BUN: 17 mg/dL (ref 6–20)
CO2: 23 mmol/L (ref 22–32)
Calcium: 8.3 mg/dL — ABNORMAL LOW (ref 8.9–10.3)
Chloride: 105 mmol/L (ref 98–111)
Creatinine, Ser: 0.88 mg/dL (ref 0.44–1.00)
GFR, Estimated: >60 mL/min (ref >60)
Glucose, Bld: 94 mg/dL (ref 70–99)
Potassium: 5.5 mmol/L — ABNORMAL HIGH (ref 3.5–5.1)
Sodium: 135 mmol/L (ref 135–145)

## 2022-03-29 LAB — CBC
HCT: 45.5 % (ref 36.0–46.0)
Hemoglobin: 14.9 g/dL (ref 12.0–15.0)
MCH: 32.9 pg (ref 26.0–34.0)
MCHC: 32.7 g/dL (ref 30.0–36.0)
MCV: 100.4 fL — ABNORMAL HIGH (ref 80.0–100.0)
Platelets: 352 10*3/uL (ref 150–400)
RBC: 4.53 MIL/uL (ref 3.87–5.11)
RDW: 12.4 % (ref 11.5–15.5)
WBC: 8.8 10*3/uL (ref 4.0–10.5)
nRBC: 0 % (ref 0.0–0.2)

## 2022-03-29 LAB — RESP PANEL BY RT-PCR (RSV, FLU A&B, COVID)  RVPGX2
Influenza A by PCR: NEGATIVE
Influenza B by PCR: NEGATIVE
Resp Syncytial Virus by PCR: NEGATIVE
SARS Coronavirus 2 by RT PCR: POSITIVE — AB

## 2022-03-29 NOTE — ED Triage Notes (Signed)
Patient here POV from Home.  Endorses Friday, she began to have URI Symptoms and was seen and treated at another ED for COVID-19. States Symptoms have worsened and is seeking Evaluation for Same. Symptoms include Headache, N/V/D, Fever, Aches, Chills, Continued Cough.   NAD Noted during Triage. A&Ox4. GCS 15. Ambulatory.

## 2022-03-30 ENCOUNTER — Emergency Department (HOSPITAL_BASED_OUTPATIENT_CLINIC_OR_DEPARTMENT_OTHER)
Admission: EM | Admit: 2022-03-30 | Discharge: 2022-03-30 | Disposition: A | Discharge status: Home / Self Care | Payer: BC Managed Care – PPO | Attending: Emergency Medicine | Admitting: Emergency Medicine

## 2022-03-30 DIAGNOSIS — U071 COVID-19: Secondary | ICD-10-CM

## 2022-03-30 MED ORDER — ACETAMINOPHEN 325 MG PO TABS
650.0000 mg | ORAL_TABLET | Freq: Once | ORAL | Status: AC
  Administered 2022-03-30: 650 mg via ORAL
  Filled 2022-03-30: qty 2

## 2022-03-30 MED ORDER — HYDROCODONE BIT-HOMATROP MBR 5-1.5 MG/5ML PO SOLN: 5.0000 mL | Freq: Four times a day (QID) | ORAL | 0 refills | Status: DC | PRN

## 2022-03-30 MED ORDER — DEXAMETHASONE SODIUM PHOSPHATE 10 MG/ML IJ SOLN
10.0000 mg | Freq: Once | INTRAMUSCULAR | Status: AC
  Administered 2022-03-30: 10 mg via INTRAVENOUS
  Filled 2022-03-30: qty 1

## 2022-03-30 MED ORDER — KETOROLAC TROMETHAMINE 60 MG/2ML IM SOLN
60.0000 mg | Freq: Once | INTRAMUSCULAR | Status: AC
  Administered 2022-03-30: 60 mg via INTRAMUSCULAR
  Filled 2022-03-30: qty 2

## 2022-03-30 NOTE — ED Provider Notes (Signed)
[facility] EMERGENCY DEPT Provider Note   CSN: [id number] Arrival date & time: 03/29/22  2000     History  Chief Complaint  Patient presents with   Cough    Gabriella Sawyer is a 41 y.o. female.  Patient here with cough and congestion.  COVID for the last few days.  Still having some bad cough and congestion.  Nothing makes it worse or better.  Multiple medications tried at home.  Fever still sometimes.  Denies any chest pain, shortness of breath, abdominal pain.  The history is provided by the patient.       Home Medications Prior to Admission medications   Medication Sig Start Date End Date Taking? Authorizing Provider  HYDROcodone bit-homatropine (HYCODAN) 5-1.5 MG/5ML syrup Take 5 mLs by mouth every 6 (six) hours as needed for cough. 03/30/22  Yes Dorien Bessent, DO  acetaminophen (TYLENOL) 500 MG tablet Take 500 mg by mouth every 6 (six) hours as needed for moderate pain.    [provider]  albuterol (VENTOLIN HFA) 108 (90 Base) MCG/ACT inhaler Inhale 2 puffs into the lungs every 4 (four) hours as needed for wheezing or shortness of breath.    [provider]  ALPRAZolam Prudy Feeler) 1 MG tablet Take 1 tablet (1 mg total) by mouth at bedtime as needed for anxiety. Patient not taking: Reported on 02/10/2021 01/29/20   Wyline Beady A, NP  cyanocobalamin (VITAMIN B12) 1000 MCG/ML injection Inject 1,000 mcg into the muscle every 30 (thirty) days. 12/26/21   [provider]  DULoxetine (CYMBALTA) 60 MG capsule Take 60 mg by mouth daily. 01/29/22   [provider]  Eflornithine HCl 13.9 % cream Apply 1 application topically 2 (two) times daily. Patient not taking: Reported on 02/10/2021 02/24/20   Wyline Beady A, NP  EPINEPHrine (EPI-PEN) 0.3 mg/0.3 mL DEVI Inject 0.3 mg into the muscle once as needed. reaction    [provider]  levonorgestrel (MIRENA) 20 MCG/24HR IUD 1 each by Intrauterine route once.    [provider]  phentermine 30 MG capsule Take 1 capsule (30 mg total) by mouth every morning. Patient not taking: Reported on 02/10/2021 01/29/20   Olivia Mackie, NP      Allergies    Codeine and Shrimp [shellfish allergy]    Review of Systems   Review of Systems  Physical Exam Updated Vital Signs BP 117/86   Pulse 63   Temp 99.1 F (37.3 C) (Oral)   Resp 18   Ht 5\' 2"  (1.575 m)   Wt 100.2 kg   SpO2 99%   BMI 40.40 kg/m  Physical Exam Vitals and nursing note reviewed.  Constitutional:      General: She is not in acute distress.    Appearance: She is well-developed. She is not ill-appearing.  HENT:     Head: Normocephalic and atraumatic.     Nose: Nose normal.     Mouth/Throat:     Mouth: Mucous membranes are moist.  Eyes:     Extraocular Movements: Extraocular movements intact.     Conjunctiva/sclera: Conjunctivae normal.     Pupils: Pupils are equal, round, and reactive to light.  Cardiovascular:     Rate and Rhythm: Normal rate and regular rhythm.     Pulses: Normal pulses.     Heart sounds: Normal heart sounds. No murmur heard. Pulmonary:     Effort: Pulmonary effort is normal. No respiratory distress.     Breath sounds: Normal breath sounds.  Abdominal:     Palpations: Abdomen is soft.     Tenderness: There is no abdominal tenderness.  Musculoskeletal:        General: No swelling.     Cervical back: Normal range of motion and neck supple.  Skin:    General: Skin is warm and dry.     Capillary Refill: Capillary refill takes less than 2 seconds.  Neurological:     General: No focal deficit present.     Mental Status: She is alert.  Psychiatric:        Mood and Affect: Mood normal.     ED Results / Procedures / Treatments   Labs (all labs ordered are listed, but only abnormal results are displayed) Labs Reviewed  RESP PANEL BY RT-PCR (RSV, FLU A&B, COVID)  RVPGX2 - Abnormal; Notable for the following components:      Result Value   SARS  Coronavirus 2 by RT PCR POSITIVE (*)    All other components within normal limits  BASIC METABOLIC PANEL - Abnormal; Notable for the following components:   Potassium 5.5 (*)    Calcium 8.3 (*)    All other components within normal limits  CBC - Abnormal; Notable for the following components:   MCV 100.4 (*)    All other components within normal limits    EKG None  Radiology DG Chest 2 View  Result Date: 03/29/2022 CLINICAL DATA:  Shortness of breath and cough. EXAM: CHEST - 2 VIEW COMPARISON:  Chest radiograph dated 03/05/2022. FINDINGS: The heart size and mediastinal contours are within normal limits. Both lungs are clear. The visualized skeletal structures are unremarkable. IMPRESSION: No active cardiopulmonary disease. Electronically Signed   By: Elgie Collard M.D.   On: 03/29/2022 21:18    Procedures Procedures    Medications Ordered in ED Medications  ketorolac (TORADOL) injection 60 mg (has no administration in time range)  dexamethasone (DECADRON) injection 10 mg (has no administration in time range)  acetaminophen (TYLENOL) tablet 650 mg (has no administration in time range)    ED Course/ Medical Decision Making/ A&P                           Medical Decision Making Amount and/or Complexity of Data Reviewed Labs: ordered. Radiology: ordered.  Risk OTC drugs. Prescription drug management.   Gabriella Sawyer is here for reevaluation of COVID.  Still positive for COVID.  Vital signs unremarkable.  She appears well.  Blood tests ordered CBC and BMP are unremarkable.  She has no significant creatinine.  Potassium 5.5 but suspect mild since.  She overall appears very well.  She still having a lot of bodyaches and sore throat.  There is no evidence of throat infection on exam.  Will give a dose of Decadron, Toradol, Tylenol.  Will prescribe her Hycodan to help give stronger cough medicine relief.  Will write her for further work excuse.  Discharged in good condition.   Patient is requesting IV fluids but I will think that is clinically indicated at this time.  I recommend that she continue oral fluids and hope that she will get relief from steroid and stronger cough medicine.  This chart was dictated using voice recognition software.  Despite best efforts to proofread,  errors can occur which can change the documentation meaning.         Final Clinical Impression(s) / ED Diagnoses Final diagnoses:  COVID-19    Rx / DC  Orders ED Discharge Orders          Ordered    HYDROcodone bit-homatropine (HYCODAN) 5-1.5 MG/5ML syrup  Every 6 hours PRN        03/30/22 0038              Virgina Norfolk, DO 03/30/22 0040

## 2022-06-16 ENCOUNTER — Ambulatory Visit (INDEPENDENT_AMBULATORY_CARE_PROVIDER_SITE_OTHER): Payer: BC Managed Care – PPO | Admitting: Nurse Practitioner

## 2022-06-16 ENCOUNTER — Encounter: Payer: Self-pay | Admitting: Nurse Practitioner

## 2022-06-16 VITALS — BP 118/82 | HR 82

## 2022-06-16 DIAGNOSIS — R1032 Left lower quadrant pain: Secondary | ICD-10-CM | POA: Diagnosis not present

## 2022-06-16 DIAGNOSIS — Z30431 Encounter for routine checking of intrauterine contraceptive device: Secondary | ICD-10-CM

## 2022-06-16 DIAGNOSIS — N83202 Unspecified ovarian cyst, left side: Secondary | ICD-10-CM

## 2022-06-16 DIAGNOSIS — T8332XA Displacement of intrauterine contraceptive device, initial encounter: Secondary | ICD-10-CM

## 2022-06-16 NOTE — Progress Notes (Signed)
   Acute Office Visit  Subjective:    Patient ID: Gabriella Sawyer, female    DOB: [birth year], 42 y.o.   MRN: [id number]   HPI 42 y.o. presents today for intermittent LLQ pain x 2 months. Describes as sharp, occurs most days, and lasts a few hours. Nothing makes it worse and nothing makes it better. Had episode of vomiting and diarrhea 3 days ago, saw PCP who gave her Imodium and Zofran. No issues since. Was not able to leave UA at that time due to dehydration. Had episode of bleeding x 2 weeks recently. Bleeding was light and mostly with wiping. Typically amenorrheic with Mirena. Inserted 12/2014.  H/O PCOS. CT scan completed 03/2022 for chest pain evaluation showed 3 cm left ovarian cyst, no follow up recommended. Denies urinary or vaginal symptoms.    Review of Systems  Constitutional: Negative.   Gastrointestinal:  Positive for abdominal pain, diarrhea (Resolved), nausea (Resolved) and vomiting (Resolved). Negative for constipation.  Genitourinary:  Positive for menstrual problem (2 weeks of spotting). Negative for dysuria, flank pain, frequency, hematuria, urgency and vaginal discharge.       Objective:    Physical Exam Constitutional:      Appearance: Normal appearance. She is obese.  Genitourinary:    General: Normal vulva.     Vagina: Normal.     Cervix: Normal.     Uterus: Normal.      Adnexa: Right adnexa normal.       Left: Tenderness present.      Comments: IUD string not visible, unable to tease out with cytobrush    BP 118/82   Pulse 82   SpO2 97%  Wt Readings from Last 3 Encounters:  03/29/22 220 lb 14.4 oz (100.2 kg)  03/05/22 221 lb (100.2 kg)  02/10/21 221 lb (100.2 kg)        Patient informed chaperone available to be present for breast and/or pelvic exam. Patient has requested no chaperone to be present. Patient has been advised what will be completed during breast and pelvic exam.   UA negative  Assessment & Plan:   Problem List Items Addressed This  Visit   None Visit Diagnoses     Left lower quadrant abdominal pain    -  Primary   Relevant Orders   Urinalysis,Complete w/RFL Culture (Completed)   US PELVIS TRANSVAGINAL NON-OB (TV ONLY)   IUD check up       Left ovarian cyst       Relevant Orders   US PELVIS TRANSVAGINAL NON-OB (TV ONLY)   Intrauterine contraceptive device threads lost, initial encounter       Relevant Orders   US PELVIS TRANSVAGINAL NON-OB (TV ONLY)      Plan: Will schedule pelvic ultrasound for evaluation of pain and lost IUD strings. UA unremarkable.      [place], 4:32 PM 06/16/2022

## 2022-06-19 LAB — URINALYSIS, COMPLETE W/RFL CULTURE
Bilirubin Urine: NEGATIVE
Glucose, UA: NEGATIVE
Hgb urine dipstick: NEGATIVE
Hyaline Cast: NONE SEEN /LPF
Ketones, ur: NEGATIVE
Leukocyte Esterase: NEGATIVE
Nitrites, Initial: NEGATIVE
Protein, ur: NEGATIVE
RBC / HPF: NONE SEEN /HPF (ref 0–2)
Specific Gravity, Urine: 1.015 (ref 1.001–1.035)
pH: 5.5 (ref 5.0–8.0)

## 2022-06-19 LAB — URINE CULTURE
MICRO NUMBER:: 14692905
SPECIMEN QUALITY:: ADEQUATE

## 2022-06-19 LAB — CULTURE INDICATED

## 2022-06-20 ENCOUNTER — Other Ambulatory Visit: Payer: Self-pay | Admitting: Nurse Practitioner

## 2022-06-20 DIAGNOSIS — N3000 Acute cystitis without hematuria: Secondary | ICD-10-CM

## 2022-06-20 MED ORDER — SULFAMETHOXAZOLE-TRIMETHOPRIM 800-160 MG PO TABS: 1.0000 | ORAL_TABLET | Freq: Two times a day (BID) | ORAL | 0 refills | Status: DC

## 2022-07-28 ENCOUNTER — Other Ambulatory Visit: Payer: BC Managed Care – PPO

## 2022-07-28 ENCOUNTER — Other Ambulatory Visit: Payer: BC Managed Care – PPO | Admitting: Nurse Practitioner

## 2022-07-28 ENCOUNTER — Telehealth: Payer: Self-pay

## 2022-07-28 NOTE — Telephone Encounter (Signed)
Left patient voice mail message that I informed TW regarding her cancelling her u/s and I have a message from TW. Asked patient to call back.

## 2022-07-28 NOTE — Telephone Encounter (Signed)
Lillia Carmel, CMA  P [facility] Triage Patient cancelled upcoming ultrasound, does not want to reschedule. Patient states not having symptoms anymore.

## 2022-07-28 NOTE — Telephone Encounter (Signed)
IUD strings were not seen on exam, so ultrasound is still recommended to verify correct position even if pain has resolved.

## 2022-07-29 NOTE — Telephone Encounter (Signed)
Pt returned call and was notified of Tiffany's recommendations. Pt reported that she will check her work schedule and give our appt desk a call on her lunch break.

## 2022-08-15 ENCOUNTER — Encounter: Payer: Self-pay | Admitting: Nurse Practitioner

## 2022-08-18 ENCOUNTER — Other Ambulatory Visit: Payer: Self-pay | Admitting: Nurse Practitioner

## 2022-08-18 DIAGNOSIS — R1032 Left lower quadrant pain: Secondary | ICD-10-CM

## 2022-08-18 DIAGNOSIS — T8332XA Displacement of intrauterine contraceptive device, initial encounter: Secondary | ICD-10-CM

## 2022-08-22 ENCOUNTER — Telehealth: Payer: Self-pay | Admitting: *Deleted

## 2022-08-22 DIAGNOSIS — Z30432 Encounter for removal of intrauterine contraceptive device: Secondary | ICD-10-CM

## 2022-08-22 NOTE — Telephone Encounter (Signed)
Message received from [place] to return call to patient. Patient seen in ER over the weekend, calling to f/u.   Spoke with patient. Patient was seen at [facility] ER on 08/19/22 for abdominal pain, was advised to f/u with GYN for IUD removal. Per review of Care Everywhere CT and PUS completed during ER visit.   Patient was last seen at [facility] 06/16/22, PUS scheduled for 10/20/2022 for evaluation of pain and lost IUD strings.   Patient reports bleeding started last week, scant amounts to spotting, notices dime size clots when voiding and wiping. Denies heavy bleeding. Pain 7/10 in lower back and abdomen. Denies fever/chills. Reports nausea with pain. Has been taking OTC Tylenol for pain, no longer effective. Hx of gastric bypass, unable to take motrin.Reports normal BM, last BM 08/21/22. Denies any urinary symptoms or flank pain.   Advised patient I will review with Tiffany and return call to further discuss next steps in care. Patient appreciative of call.   Tiffany, please review and advise.

## 2022-08-22 NOTE — Telephone Encounter (Signed)
Pelvic U/S in ER not able to verify position of IUD based on images and CT scan cannot verify correct position. I recommend she come in for removal. Is she interested in exchanging it or would she prefer to switch to different method of birth control?

## 2022-08-22 NOTE — Telephone Encounter (Signed)
Spoke with patient. Advised per Elmarie Shiley, NP.  Patient does not plan to replace with another IUD at this time, will discuss alternative options. OV scheduled for IUD removal on 5/21 at 0845 with Tiffany, NP. Questions answered. Will cancel future PUS after seen in office tomorrow. Patient verbalizes understanding and is agreeable.   Future order placed.  Routing to provider for final review. Patient is agreeable to disposition. Will close encounter.

## 2022-08-23 ENCOUNTER — Encounter: Payer: Self-pay | Admitting: Nurse Practitioner

## 2022-08-23 ENCOUNTER — Ambulatory Visit (INDEPENDENT_AMBULATORY_CARE_PROVIDER_SITE_OTHER): Payer: BC Managed Care – PPO | Admitting: Nurse Practitioner

## 2022-08-23 VITALS — BP 114/78

## 2022-08-23 DIAGNOSIS — Z30432 Encounter for removal of intrauterine contraceptive device: Secondary | ICD-10-CM

## 2022-08-23 DIAGNOSIS — E282 Polycystic ovarian syndrome: Secondary | ICD-10-CM | POA: Diagnosis not present

## 2022-08-23 DIAGNOSIS — R109 Unspecified abdominal pain: Secondary | ICD-10-CM

## 2022-08-23 MED ORDER — LEVONORGEST-ETH ESTRAD 91-DAY 0.15-0.03 &0.01 MG PO TABS: 1.0000 | ORAL_TABLET | Freq: Every day | ORAL | 0 refills | Status: DC

## 2022-08-23 NOTE — Progress Notes (Signed)
   Acute Office Visit  Subjective:    Patient ID: Gabriella Sawyer, female    DOB: 11/23/1980, 42 y.o.   MRN: [id number]   HPI 42 y.o. [id number] presents today for IUD removal. Seen 06/16/2022 for intermittent LLQ pain x 2 months. IUD string not seen on exam at that time and ultrasound recommended to confirm position. U/S scheduled in July.  Presented to ER 08/19/22 for abdominal pain and vaginal bleeding x 3 days. Radiology tech reported IUD present but images submitted do not clearly demonstrate the location. Mirena inserted 12/2014.  H/O PCOS. Wants to discuss other PCOS management options. Friend takes Seasonique and would like to try this. Prefers to take continuously. BTL.   No LMP recorded. (Menstrual status: IUD).    Review of Systems  Constitutional: Negative.   Gastrointestinal:  Positive for abdominal pain.  Genitourinary:  Positive for vaginal bleeding (Not currently).       Objective:    Physical Exam Constitutional:      Appearance: Normal appearance.  Genitourinary:    General: Normal vulva.     Vagina: Normal.     Cervix: Normal.     Comments: IUD not visualized    BP 114/78 (BP Location: Left Arm, Patient Position: Sitting, Cuff Size: Large)  Wt Readings from Last 3 Encounters:  03/29/22 220 lb 14.4 oz (100.2 kg)  03/05/22 221 lb (100.2 kg)  02/10/21 221 lb (100.2 kg)        Patient informed chaperone available to be present for breast and/or pelvic exam. Patient has requested no chaperone to be present. Patient has been advised what will be completed during breast and pelvic exam.   Assessment & Plan:   Problem List Items Addressed This Visit       Endocrine   PCOS (polycystic ovarian syndrome)   Relevant Medications   Levonorgestrel-Ethinyl Estradiol (SEASONIQUE) 0.15-0.03 &0.01 MG tablet   Other Visit Diagnoses     Encounter for IUD removal    -  Primary   Abdominal pain, unspecified abdominal location          Procedural note: Speculum inserted  and cervix visualize. Again IUD string not seen. Alligator forceps inserted and IUD grasped and removed with ease. Patient tolerated well. No bleeding noted.   Plan: Interested in COCs for PCOS management. Wants to suppress menses and take continuously. No contraindications. Educated on proper use.      Olivia Mackie DNP, 10:46 AM 08/23/2022

## 2022-08-24 ENCOUNTER — Telehealth: Payer: Self-pay

## 2022-08-24 NOTE — Telephone Encounter (Signed)
PA received from Covermymeds for [employer]. KEY:  [id number] PCOS E28.2 BC: tubal Patient notified of Prior Authorization.

## 2022-08-24 NOTE — Telephone Encounter (Signed)
Spoke to patient, patient states she has 2 insurance plans and the pharmacy should run whichever insurance is cheaper.   [employer]  ID [id number] Group [id number] RX BIN [id number] RXPCN [id number] RX Group [id number]  [employer] ID [id number] Group [id number] RX BIN [id number] RXPCN [id number] RX Group [id number]  Initiated a second PA  [employer] through covermymeds and "Could not process PA due to plan number". Initiated another PA using [employer] and RX does not need a PA it is covered under the prescription benefits.    Contacted patient by phone and notified her of above.  She will call her pharmacy and have them process her [facility] RX benefits.

## 2022-08-30 ENCOUNTER — Encounter: Payer: Self-pay | Admitting: Nurse Practitioner

## 2022-09-05 ENCOUNTER — Encounter: Payer: Self-pay | Admitting: Nurse Practitioner

## 2022-09-06 ENCOUNTER — Encounter: Payer: Self-pay | Admitting: Nurse Practitioner

## 2022-09-06 ENCOUNTER — Other Ambulatory Visit: Payer: Self-pay | Admitting: Nurse Practitioner

## 2022-09-06 DIAGNOSIS — Z3041 Encounter for surveillance of contraceptive pills: Secondary | ICD-10-CM

## 2022-09-06 DIAGNOSIS — E282 Polycystic ovarian syndrome: Secondary | ICD-10-CM

## 2022-09-06 MED ORDER — LEVONORGEST-ETH ESTRAD 91-DAY 0.15-0.03 &0.01 MG PO TABS: 1.0000 | ORAL_TABLET | Freq: Every day | ORAL | 0 refills | Status: DC

## 2022-09-07 ENCOUNTER — Other Ambulatory Visit: Payer: Self-pay

## 2022-09-07 ENCOUNTER — Telehealth: Payer: Self-pay

## 2022-09-07 DIAGNOSIS — Z3041 Encounter for surveillance of contraceptive pills: Secondary | ICD-10-CM

## 2022-09-07 DIAGNOSIS — E282 Polycystic ovarian syndrome: Secondary | ICD-10-CM

## 2022-09-07 MED ORDER — LEVONORGEST-ETH ESTRAD 91-DAY 0.15-0.03 &0.01 MG PO TABS: 1.0000 | ORAL_TABLET | Freq: Every day | ORAL | 0 refills | Status: DC

## 2022-09-07 NOTE — Telephone Encounter (Signed)
PA started via Covermymeds.

## 2022-09-07 NOTE — Telephone Encounter (Signed)
PA started for pt for [facility]. PA could not be completed due to incorrect insurance information.  Spoke to pt via phone to deliver correct insurance information to pharmacy and pt requested new request to be sent in to a different [facility]. Refill medication with new pharmacy being sent to provider.

## 2022-09-11 ENCOUNTER — Ambulatory Visit: Payer: BC Managed Care – PPO

## 2022-10-20 ENCOUNTER — Other Ambulatory Visit: Payer: BC Managed Care – PPO | Admitting: Nurse Practitioner

## 2022-10-20 ENCOUNTER — Other Ambulatory Visit: Payer: BC Managed Care – PPO

## 2022-11-03 ENCOUNTER — Other Ambulatory Visit: Payer: Self-pay | Admitting: Nurse Practitioner

## 2022-11-03 DIAGNOSIS — Z3041 Encounter for surveillance of contraceptive pills: Secondary | ICD-10-CM

## 2022-11-03 DIAGNOSIS — E282 Polycystic ovarian syndrome: Secondary | ICD-10-CM

## 2022-11-04 NOTE — Telephone Encounter (Signed)
Med refill request: Seasonique Last AEX: 02/10/21 Next AEX: 11/17/22 Last MMG (if hormonal med) 08/06/21 Refill authorized: Please Advise, #[phone], 0 RF

## 2022-11-09 ENCOUNTER — Ambulatory Visit
Admission: EM | Admit: 2022-11-09 | Discharge: 2022-11-09 | Disposition: A | Discharge status: Home / Self Care | Payer: BC Managed Care – PPO | Attending: Physician Assistant | Admitting: Physician Assistant

## 2022-11-09 DIAGNOSIS — J029 Acute pharyngitis, unspecified: Secondary | ICD-10-CM | POA: Insufficient documentation

## 2022-11-09 DIAGNOSIS — Z1152 Encounter for screening for COVID-19: Secondary | ICD-10-CM | POA: Diagnosis not present

## 2022-11-09 LAB — POCT RAPID STREP A (OFFICE): Rapid Strep A Screen: NEGATIVE

## 2022-11-09 MED ORDER — FLUCONAZOLE 150 MG PO TABS: ORAL_TABLET | ORAL | 0 refills | Status: DC

## 2022-11-09 MED ORDER — AMOXICILLIN 400 MG/5ML PO SUSR: 875.0000 mg | Freq: Two times a day (BID) | ORAL | 0 refills | Status: AC

## 2022-11-09 NOTE — ED Provider Notes (Signed)
[name]    CSN: [id number] Arrival date & time: 11/09/22  1741      History   Chief Complaint Chief Complaint  Patient presents with   Sore Throat    HPI Gabriella Sawyer is a 42 y.o. female.   Patient here today for evaluation of sore throat, headache and ear pain that started today. She notes she took a covid test that was negative. She has had chills. She has not had fever. She denies any nausea, vomiting or diarrhea. She has taken OTC meds without resolution.  The history is provided by the patient.  Sore Throat Pertinent negatives include no abdominal pain and no shortness of breath.    Past Medical History:  Diagnosis Date   Arthritis    Common migraine with intractable migraine 05/17/2017   LGSIL (low grade squamous intraepithelial dysplasia) 09/2013, 09/2014   Positive high risk HPV screen 2015, negative high risk HPV 2016   Panic attacks    PCOS (polycystic ovarian syndrome)    PONV (postoperative nausea and vomiting)     Patient Active Problem List   Diagnosis Date Noted   Common migraine with intractable migraine 05/17/2017   Major depressive disorder, single episode, moderate (HCC) 07/21/2016   VAIN I (vaginal intraepithelial neoplasia grade I) 07/29/2011   PCOS (polycystic ovarian syndrome)     Past Surgical History:  Procedure Laterality Date   CERVICAL BIOPSY  W/ LOOP ELECTRODE EXCISION  11/01/2013   HGSIL on cervical biopsy, LGSIL on LEEP specimen with endocervical margin involvement   CESAREAN SECTION  12-03-2001  &  03-24-2003   LAST ONE W/ TUBAL LIGATION   FOOT SURGERY  2000   left foot, screw inserted   INTRAUTERINE DEVICE INSERTION  01/01/2015   Mirena   KNEE ARTHROSCOPY  12/16/2011   Procedure: ARTHROSCOPY KNEE;  Surgeon: Javier Docker, MD;  Location: [facility];  Service: Orthopedics;  Laterality: Right;  Right Knee Arthroscopy with Excision of Tibial Exostosis, debridement right knee medial menisectomy   KNEE ARTHROSCOPY   03/21/2012   Procedure: ARTHROSCOPY KNEE;  Surgeon: Eugenia Mcalpine, MD;  Location: [facility];  Service: Orthopedics;  Laterality: Right;  right chondroplasty   KNEE ARTHROSCOPY W/ MENISCECTOMY  07-08-2011  [facility]   RIGHT KNEE   KNEE SURGERY     LAPAROSCOPIC CHOLECYSTECTOMY  12-22-2008   MEDIAL COLLATERAL LIGAMENT REPAIR, KNEE  03/21/2012   Procedure: RECONSTRUCTION MEDIAL COLLATERAL LIGAMENT (MCL);  Surgeon: Eugenia Mcalpine, MD;  Location: [facility];  Service: Orthopedics;  Laterality: Right;  achilles tendon allograft   ROUX-EN-Y GASTRIC BYPASS  01-22-2008   LAPAROSCOPIC   TONSILLECTOMY AND ADENOIDECTOMY  1995   TUBAL LIGATION     WISDOM TOOTH EXTRACTION  2001    OB History     Gravida  2   Para  2   Term  2   Preterm      AB      Living  2      SAB      IAB      Ectopic      Multiple      Live Births               Home Medications    Prior to Admission medications   Medication Sig Start Date End Date Taking? Authorizing Provider  ALPRAZolam Prudy Feeler) 1 MG tablet Take 1 tablet (1 mg total) by mouth at bedtime as needed for anxiety. 01/29/20  Yes Olivia Mackie, NP  amoxicillin (AMOXIL) 400 MG/5ML suspension Take 10.9 mLs (875 mg total) by mouth 2 (two) times daily for 10 days. 11/09/22 11/19/22 Yes Tomi Bamberger, PA-C  cetirizine (ZYRTEC) 10 MG tablet Take 1 tablet by mouth daily. 07/13/22  Yes [provider]  cyanocobalamin (VITAMIN B12) 1000 MCG/ML injection Inject 1,000 mcg into the muscle every 30 (thirty) days. 12/26/21  Yes [provider]  fluticasone Aleda Grana) 50 MCG/ACT nasal spray  05/02/17  Yes [provider]  gabapentin (NEURONTIN) 100 MG capsule Take 1 capsule 3 times a day by oral route, for pain. 07/27/22  Yes [provider]  acetaminophen (TYLENOL) 500 MG tablet Take 500 mg by mouth every 6 (six) hours as needed for moderate pain.    [provider]  albuterol (VENTOLIN HFA) 108 (90 Base) MCG/ACT  inhaler Inhale 2 puffs into the lungs every 4 (four) hours as needed for wheezing or shortness of breath. Patient not taking: Reported on 08/23/2022    [provider]  Calcium Citrate-Vitamin D 315-5 MG-MCG TABS Take by mouth. 05/11/17   [provider]  EPINEPHrine (EPI-PEN) 0.3 mg/0.3 mL DEVI Inject 0.3 mg into the muscle once as needed. reaction    [provider]  SIMPESSE 0.15-0.03 &0.01 MG tablet TAKE 1 TABLET BY MOUTH DAILY. TAKE ACTIVE PILLS ONLY, SKIPPING PLACEBO PILLS 11/04/22   Chrzanowski, Jami B, NP    Family History Family History  Problem Relation Age of Onset   Diabetes Father    Hypertension Father    Cancer Maternal Grandmother        COLON CANCER   Diabetes Paternal Grandfather    Breast cancer Neg Hx     Social History Social History   Tobacco Use   Smoking status: Never   Smokeless tobacco: Never  Vaping Use   Vaping status: Never Used  Substance Use Topics   Alcohol use: No    Alcohol/week: 0.0 standard drinks of alcohol   Drug use: No     Allergies   Codeine and Shrimp [shellfish allergy]   Review of Systems Review of Systems  Constitutional:  Positive for chills. Negative for fever.  HENT:  Positive for congestion, ear pain and sore throat.   Eyes:  Negative for discharge and redness.  Respiratory:  Negative for cough, shortness of breath and wheezing.   Gastrointestinal:  Negative for abdominal pain, diarrhea, nausea and vomiting.     Physical Exam Triage Vital Signs ED Triage Vitals  Encounter Vitals Group     BP 11/09/22 1820 127/80     Systolic BP Percentile --      Diastolic BP Percentile --      Pulse Rate 11/09/22 1820 69     Resp 11/09/22 1820 18     Temp 11/09/22 1820 98.7 F (37.1 C)     Temp Source 11/09/22 1820 Oral     SpO2 11/09/22 1820 97 %     Weight 11/09/22 1817 245 lb (111.1 kg)     Height 11/09/22 1817 5\' 2"  (1.575 m)     Head Circumference --      Peak Flow --      Pain Score 11/09/22  1816 7     Pain Loc --      Pain Education --      Exclude from Growth Chart --    No data found.  Updated Vital Signs BP 127/80 (BP Location: Left Arm)   Pulse 69   Temp 98.7 F (37.1 C) (  Oral)   Resp 18   Ht 5\' 2"  (1.575 m)   Wt 245 lb (111.1 kg)   LMP  (LMP Unknown)   SpO2 97%   BMI 44.81 kg/m   Physical Exam Vitals and nursing note reviewed.  Constitutional:      General: She is not in acute distress.    Appearance: Normal appearance. She is not ill-appearing.  HENT:     Head: Normocephalic and atraumatic.     Right Ear: Tympanic membrane normal.     Left Ear: Tympanic membrane normal.     Nose: Congestion present.     Mouth/Throat:     Mouth: Mucous membranes are moist.     Pharynx: Posterior oropharyngeal erythema present. No oropharyngeal exudate.     Comments: Tongue with superficial ulcerations noted Eyes:     Conjunctiva/sclera: Conjunctivae normal.  Cardiovascular:     Rate and Rhythm: Normal rate and regular rhythm.     Heart sounds: Normal heart sounds. No murmur heard. Pulmonary:     Effort: Pulmonary effort is normal. No respiratory distress.     Breath sounds: Normal breath sounds. No wheezing, rhonchi or rales.  Skin:    General: Skin is warm and dry.  Neurological:     Mental Status: She is alert.  Psychiatric:        Mood and Affect: Mood normal.        Thought Content: Thought content normal.      UC Treatments / Results  Labs (all labs ordered are listed, but only abnormal results are displayed) Labs Reviewed  SARS CORONAVIRUS 2 (TAT 6-24 HRS)  POCT RAPID STREP A (OFFICE)    EKG   Radiology No results found.  Procedures Procedures (including critical care time)  Medications Ordered in UC Medications - No data to display  Initial Impression / Assessment and Plan / UC Course  I have reviewed the triage vital signs and the nursing notes.  Pertinent labs & imaging results that were available during my care of the patient  were reviewed by me and considered in my medical decision making (see chart for details).    Will treat with amoxicillin to cover bacterial infection.  Patient requests liquid formulation.  She also request Diflucan as she typically will have yeast infections after antibiotic use.  Discussed possibility of hand-foot-and-mouth given ulcerations noted on tongue.  Patient does not have rash elsewhere at this time.  Screening ordered for COVID.  Recommended symptomatic treatment, increase fluids and rest.  Advise follow-up with any worsening symptoms or failure to improve.  Final Clinical Impressions(s) / UC Diagnoses   Final diagnoses:  Acute pharyngitis, unspecified etiology   Discharge Instructions   None    ED Prescriptions     Medication Sig Dispense Auth. Provider   amoxicillin (AMOXIL) 400 MG/5ML suspension Take 10.9 mLs (875 mg total) by mouth 2 (two) times daily for 10 days. 230 mL Tomi Bamberger, PA-C      PDMP not reviewed this encounter.   Tomi Bamberger, PA-C 11/09/22 1945

## 2022-11-09 NOTE — ED Triage Notes (Signed)
"  Sore throat with headache and ear pain all day". "A lot of chills". COVID19 test at 3pm today (by patent) & Negative. No fever.

## 2022-11-17 ENCOUNTER — Ambulatory Visit: Payer: BC Managed Care – PPO | Admitting: Nurse Practitioner

## 2022-11-18 ENCOUNTER — Telehealth: Payer: Self-pay | Admitting: *Deleted

## 2022-11-18 NOTE — Telephone Encounter (Signed)
Patient was seen in office for IUD removal on 08/23/22 by TW. IUD removed.   Patient has open order for PUS for lost IUD string and LLQ pain ordered 08/17/21.   Tiffany -ok to cancel PUS order?

## 2022-11-21 NOTE — Telephone Encounter (Signed)
Yes OK to cancel. Thanks.

## 2022-11-22 NOTE — Telephone Encounter (Signed)
Orders cancelled. Encounter closed.

## 2022-12-14 NOTE — Telephone Encounter (Signed)
Routing to Tiffany to advise on alternative RX.

## 2022-12-14 NOTE — Telephone Encounter (Signed)
PA submitted to [facility] plan on file for [employer] via [email or web address] KEY [id number] This medication or product is on your plan's list of covered drugs. Prior authorization is not required at this time. If your pharmacy has questions regarding the processing of your prescription, please have them call the [facility] pharmacy help desk at [phone]. **Please note: This request was submitted electronically. Formulary lowering, tiering exception, cost reduction and/or pre-benefit determination review (including prospective Medicare hospice reviews) requests cannot be requested using this method of submission. Providers contact us at [phone] for further assistance.

## 2022-12-15 ENCOUNTER — Other Ambulatory Visit: Payer: Self-pay | Admitting: Nurse Practitioner

## 2022-12-15 DIAGNOSIS — Z3041 Encounter for surveillance of contraceptive pills: Secondary | ICD-10-CM

## 2022-12-15 MED ORDER — JUNEL FE 24 1-20 MG-MCG(24) PO TABS
1.0000 | ORAL_TABLET | Freq: Every day | ORAL | 0 refills | Status: DC
Start: 2022-12-15 — End: 2023-03-20

## 2022-12-16 NOTE — Telephone Encounter (Signed)
Left message to call Noreene Larsson, RN at [facility], [phone], option 5.

## 2022-12-30 ENCOUNTER — Emergency Department (HOSPITAL_BASED_OUTPATIENT_CLINIC_OR_DEPARTMENT_OTHER)
Admission: EM | Admit: 2022-12-30 | Discharge: 2022-12-31 | Disposition: A | Discharge status: Home / Self Care | Payer: BC Managed Care – PPO | Attending: Emergency Medicine | Admitting: Emergency Medicine

## 2022-12-30 ENCOUNTER — Emergency Department (HOSPITAL_BASED_OUTPATIENT_CLINIC_OR_DEPARTMENT_OTHER): Payer: BC Managed Care – PPO | Admitting: Radiology

## 2022-12-30 ENCOUNTER — Encounter (HOSPITAL_BASED_OUTPATIENT_CLINIC_OR_DEPARTMENT_OTHER): Payer: Self-pay

## 2022-12-30 ENCOUNTER — Other Ambulatory Visit: Payer: Self-pay

## 2022-12-30 ENCOUNTER — Emergency Department (HOSPITAL_BASED_OUTPATIENT_CLINIC_OR_DEPARTMENT_OTHER): Payer: BC Managed Care – PPO

## 2022-12-30 DIAGNOSIS — M79661 Pain in right lower leg: Secondary | ICD-10-CM | POA: Diagnosis present

## 2022-12-30 NOTE — ED Triage Notes (Signed)
Pt reports pain behind the R knee x 1 week. Pt denies injury and states she has been vomiting which she attributes to the knee pain. Pt states below the knee is tender to touch. Pt is concerned that she has a blood clot. No hx of same.

## 2022-12-30 NOTE — ED Provider Notes (Signed)
EMERGENCY DEPARTMENT AT [facility] Provider Note  CSN: [id number] Arrival date & time: 12/30/22 1841  Chief Complaint(s) Knee Pain  HPI Gabriella Sawyer is a 42 y.o. female with a past medical history listed below including multiple right knee surgeries who presents to the emergency department with 1 week of right calf pain worse with ambulation and palpation.  Patient denies any fall or trauma.  States that she sits for most of the day but does get up and walk around every few hours.  She denied any prolonged immobilization.  No obvious trigger.  Patient is most concerning for a DVT that she has never had a DVT before.  Patient has tried using Voltaren with minimal relief.  No other over-the-counter medicines use.  The history is provided by the patient.    Past Medical History Past Medical History:  Diagnosis Date   Arthritis    Common migraine with intractable migraine 05/17/2017   LGSIL (low grade squamous intraepithelial dysplasia) 09/2013, 09/2014   Positive high risk HPV screen 2015, negative high risk HPV 2016   Panic attacks    PCOS (polycystic ovarian syndrome)    PONV (postoperative nausea and vomiting)    Patient Active Problem List   Diagnosis Date Noted   Common migraine with intractable migraine 05/17/2017   Major depressive disorder, single episode, moderate (HCC) 07/21/2016   VAIN I (vaginal intraepithelial neoplasia grade I) 07/29/2011   PCOS (polycystic ovarian syndrome)    Home Medication(s) Prior to Admission medications   Medication Sig Start Date End Date Taking? Authorizing Provider  naproxen (NAPROSYN) 375 MG tablet Take 1 tablet (375 mg total) by mouth 2 (two) times daily. 12/31/22  Yes Brayleigh Rybacki, Amadeo Garnet, MD  acetaminophen (TYLENOL) 500 MG tablet Take 500 mg by mouth every 6 (six) hours as needed for moderate pain.    [provider]  albuterol (VENTOLIN HFA) 108 (90 Base) MCG/ACT inhaler Inhale 2 puffs into the lungs every  4 (four) hours as needed for wheezing or shortness of breath. Patient not taking: Reported on 08/23/2022    [provider]  ALPRAZolam Prudy Feeler) 1 MG tablet Take 1 tablet (1 mg total) by mouth at bedtime as needed for anxiety. 01/29/20   Olivia Mackie, NP  Calcium Citrate-Vitamin D 315-5 MG-MCG TABS Take by mouth. 05/11/17   [provider]  cetirizine (ZYRTEC) 10 MG tablet Take 1 tablet by mouth daily. 07/13/22   [provider]  cyanocobalamin (VITAMIN B12) 1000 MCG/ML injection Inject 1,000 mcg into the muscle every 30 (thirty) days. 12/26/21   [provider]  EPINEPHrine (EPI-PEN) 0.3 mg/0.3 mL DEVI Inject 0.3 mg into the muscle once as needed. reaction    [provider]  fluconazole (DIFLUCAN) 150 MG tablet Take one tab PO today and repeat dose in 3 days if symptoms persist 11/09/22   Tomi Bamberger, PA-C  fluticasone [facility]) 50 MCG/ACT nasal spray  05/02/17   [provider]  gabapentin (NEURONTIN) 100 MG capsule Take 1 capsule 3 times a day by oral route, for pain. 07/27/22   [provider]  Norethindrone Acetate-Ethinyl Estrad-FE (JUNEL FE 24) 1-20 MG-MCG(24) tablet Take 1 tablet by mouth daily. Take ACTIVE pills only, skipping PLACEBO pills 12/15/22   Olivia Mackie, NP  Allergies Codeine and Shrimp [shellfish allergy]  Review of Systems Review of Systems As noted in HPI  Physical Exam Vital Signs  I have reviewed the triage vital signs BP 123/75 (BP Location: Left Arm)   Pulse 73   Temp 98.6 F (37 C)   Resp (!) 6   Ht 5\' 2"  (1.575 m)   Wt 108.9 kg   LMP 12/30/2022   SpO2 97%   BMI 43.90 kg/m   Physical Exam Vitals reviewed.  Constitutional:      General: She is not in acute distress.    Appearance: She is well-developed. She is not diaphoretic.  HENT:     Head:  Normocephalic and atraumatic.     Right Ear: External ear normal.     Left Ear: External ear normal.     Nose: Nose normal.  Eyes:     General: No scleral icterus.    Conjunctiva/sclera: Conjunctivae normal.  Neck:     Trachea: Phonation normal.  Cardiovascular:     Rate and Rhythm: Normal rate and regular rhythm.  Pulmonary:     Effort: Pulmonary effort is normal. No respiratory distress.     Breath sounds: No stridor.  Abdominal:     General: There is no distension.  Musculoskeletal:        General: Normal range of motion.     Cervical back: Normal range of motion.     Right knee: Normal range of motion. No tenderness.     Right lower leg: Tenderness present. No swelling, deformity, lacerations or bony tenderness. No edema.     Left lower leg: No swelling, deformity, lacerations, tenderness or bony tenderness. No edema.       Legs:  Neurological:     Mental Status: She is alert and oriented to person, place, and time.  Psychiatric:        Behavior: Behavior normal.     ED Results and Treatments Labs (all labs ordered are listed, but only abnormal results are displayed) Labs Reviewed - No data to display                                                                                                                       EKG  EKG Interpretation Date/Time:    Ventricular Rate:    PR Interval:    QRS Duration:    QT Interval:    QTC Calculation:   R Axis:      Text Interpretation:         Radiology US Venous Img Lower Unilateral Right  Result Date: 12/30/2022 CLINICAL DATA:  Right knee pain for several days, initial encounter EXAM: RIGHT LOWER EXTREMITY VENOUS DOPPLER ULTRASOUND TECHNIQUE: Gray-scale sonography with graded compression, as well as color Doppler and duplex ultrasound were performed to evaluate the lower extremity deep venous systems from the level of the common femoral vein and including the common femoral, femoral, profunda femoral, popliteal and  calf veins including the posterior tibial, peroneal and gastrocnemius veins  when visible. The superficial great saphenous vein was also interrogated. Spectral Doppler was utilized to evaluate flow at rest and with distal augmentation maneuvers in the common femoral, femoral and popliteal veins. COMPARISON:  None Available. FINDINGS: Contralateral Common Femoral Vein: Respiratory phasicity is normal and symmetric with the symptomatic side. No evidence of thrombus. Normal compressibility. Common Femoral Vein: No evidence of thrombus. Normal compressibility, respiratory phasicity and response to augmentation. Saphenofemoral Junction: No evidence of thrombus. Normal compressibility and flow on color Doppler imaging. Profunda Femoral Vein: No evidence of thrombus. Normal compressibility and flow on color Doppler imaging. Femoral Vein: No evidence of thrombus. Normal compressibility, respiratory phasicity and response to augmentation. Popliteal Vein: No evidence of thrombus. Normal compressibility, respiratory phasicity and response to augmentation. Calf Veins: Not well visualized. Superficial Great Saphenous Vein: No evidence of thrombus. Normal compressibility. Venous Reflux:  None. Other Findings:  None. IMPRESSION: Poor visualization of the calf veins.  No definitive DVT is noted. Electronically Signed   By: Alcide Clever M.D.   On: 12/30/2022 22:01    Medications Ordered in ED Medications  ketorolac (TORADOL) injection 30 mg (30 mg Intramuscular Given 12/31/22 0009)  acetaminophen (TYLENOL) tablet 1,000 mg (1,000 mg Oral Given 12/31/22 0010)   Procedures Procedures  (including critical care time) Medical Decision Making / ED Course   Medical Decision Making Risk OTC drugs. Prescription drug management.    Right calf pain Ultrasound negative for DVT. No trauma or fall requiring x-ray at this time. Pain is in knee joint that would be concerning for septic arthritis. Oral Tylenol and IM Toradol  given. Supportive management recommended. Follow-up with orthopedic    Final Clinical Impression(s) / ED Diagnoses Final diagnoses:  Right calf pain   The patient appears reasonably screened and/or stabilized for discharge and I doubt any other medical condition or other [facility] requiring further screening, evaluation, or treatment in the ED at this time. I have discussed the findings, Dx and Tx plan with the patient/family who expressed understanding and agree(s) with the plan. Discharge instructions discussed at length. The patient/family was given strict return precautions who verbalized understanding of the instructions. No further questions at time of discharge.  Disposition: Discharge  Condition: Good  ED Discharge Orders          Ordered    naproxen (NAPROSYN) 375 MG tablet  2 times daily        12/31/22 0004              Follow Up: Specialists, [facility] [address] [place] [address] [phone]  Call  as needed, if symptoms do not improve or  worsen    This chart was dictated using voice recognition software.  Despite best efforts to proofread,  errors can occur which can change the documentation meaning.    Nira Conn, MD 12/31/22 [phone]

## 2022-12-31 DIAGNOSIS — M79661 Pain in right lower leg: Secondary | ICD-10-CM | POA: Diagnosis not present

## 2022-12-31 MED ORDER — NAPROXEN 375 MG PO TABS: 375.0000 mg | ORAL_TABLET | Freq: Two times a day (BID) | ORAL | 0 refills | Status: DC

## 2022-12-31 MED ORDER — ACETAMINOPHEN 500 MG PO TABS
1000.0000 mg | ORAL_TABLET | Freq: Once | ORAL | Status: AC
  Administered 2022-12-31: 1000 mg via ORAL
  Filled 2022-12-31: qty 2

## 2022-12-31 MED ORDER — KETOROLAC TROMETHAMINE 60 MG/2ML IM SOLN
30.0000 mg | Freq: Once | INTRAMUSCULAR | Status: AC
  Administered 2022-12-31: 30 mg via INTRAMUSCULAR
  Filled 2022-12-31: qty 2

## 2022-12-31 NOTE — Discharge Instructions (Addendum)
For pain control you may take 1000 mg of Tylenol every 8 hours as needed.  

## 2023-02-08 ENCOUNTER — Ambulatory Visit: Payer: BC Managed Care – PPO | Admitting: Nurse Practitioner

## 2023-03-17 ENCOUNTER — Other Ambulatory Visit: Payer: Self-pay | Admitting: Nurse Practitioner

## 2023-03-17 DIAGNOSIS — Z3041 Encounter for surveillance of contraceptive pills: Secondary | ICD-10-CM

## 2023-03-17 NOTE — Telephone Encounter (Signed)
Med refill request: Junel FE 24 Last AEX: 02/10/21 Last OV:  08/04/22 IUD removal Next AEX: none scheduled Last MMG (if hormonal med) 08/06/21 Refill sent to provider for approval or denial as appropriate.

## 2023-04-21 ENCOUNTER — Other Ambulatory Visit: Payer: Self-pay | Admitting: Nurse Practitioner

## 2023-04-21 DIAGNOSIS — Z1231 Encounter for screening mammogram for malignant neoplasm of breast: Secondary | ICD-10-CM

## 2023-05-05 ENCOUNTER — Ambulatory Visit: Payer: 59

## 2023-05-09 ENCOUNTER — Ambulatory Visit: Payer: 59

## 2023-05-17 ENCOUNTER — Ambulatory Visit
Admission: RE | Admit: 2023-05-17 | Discharge: 2023-05-17 | Disposition: A | Discharge status: Home / Self Care | Payer: 59 | Source: Ambulatory Visit | Attending: Nurse Practitioner

## 2023-05-17 DIAGNOSIS — Z1231 Encounter for screening mammogram for malignant neoplasm of breast: Secondary | ICD-10-CM

## 2023-05-23 ENCOUNTER — Other Ambulatory Visit: Payer: Self-pay | Admitting: Nurse Practitioner

## 2023-05-23 DIAGNOSIS — R928 Other abnormal and inconclusive findings on diagnostic imaging of breast: Secondary | ICD-10-CM

## 2023-05-30 ENCOUNTER — Encounter: Payer: Self-pay | Admitting: Nurse Practitioner

## 2023-05-30 ENCOUNTER — Ambulatory Visit
Admission: RE | Admit: 2023-05-30 | Discharge: 2023-05-30 | Disposition: A | Discharge status: Home / Self Care | Payer: 59 | Source: Ambulatory Visit | Attending: Nurse Practitioner | Admitting: Nurse Practitioner

## 2023-05-30 ENCOUNTER — Ambulatory Visit
Admission: RE | Admit: 2023-05-30 | Discharge: 2023-05-30 | Disposition: A | Discharge status: Home / Self Care | Payer: 59 | Source: Ambulatory Visit | Attending: Nurse Practitioner

## 2023-05-30 DIAGNOSIS — R928 Other abnormal and inconclusive findings on diagnostic imaging of breast: Secondary | ICD-10-CM

## 2023-06-07 ENCOUNTER — Ambulatory Visit (INDEPENDENT_AMBULATORY_CARE_PROVIDER_SITE_OTHER): Payer: BC Managed Care – PPO | Admitting: Nurse Practitioner

## 2023-06-07 ENCOUNTER — Other Ambulatory Visit (HOSPITAL_COMMUNITY)
Admission: RE | Admit: 2023-06-07 | Discharge: 2023-06-07 | Disposition: A | Discharge status: Home / Self Care | Source: Ambulatory Visit | Attending: Nurse Practitioner | Admitting: Nurse Practitioner

## 2023-06-07 VITALS — BP 112/72 | HR 83 | Ht 62.0 in | Wt 248.0 lb

## 2023-06-07 DIAGNOSIS — Z01419 Encounter for gynecological examination (general) (routine) without abnormal findings: Secondary | ICD-10-CM

## 2023-06-07 DIAGNOSIS — Z124 Encounter for screening for malignant neoplasm of cervix: Secondary | ICD-10-CM

## 2023-06-07 DIAGNOSIS — E282 Polycystic ovarian syndrome: Secondary | ICD-10-CM | POA: Diagnosis not present

## 2023-06-07 DIAGNOSIS — F4321 Adjustment disorder with depressed mood: Secondary | ICD-10-CM

## 2023-06-07 DIAGNOSIS — Z1331 Encounter for screening for depression: Secondary | ICD-10-CM | POA: Diagnosis not present

## 2023-06-07 DIAGNOSIS — Z3041 Encounter for surveillance of contraceptive pills: Secondary | ICD-10-CM | POA: Diagnosis not present

## 2023-06-07 MED ORDER — HAILEY 24 FE 1-20 MG-MCG(24) PO TABS
1.0000 | ORAL_TABLET | Freq: Every day | ORAL | 3 refills | Status: AC
Start: 2023-06-07 — End: ?

## 2023-06-07 NOTE — Progress Notes (Signed)
 Gabriella Sawyer 10-16-1980 [id number]   History:  43 y.o. G2P2002 presents for annual exam. OCPs continuously. 2013 VAIN-1. 2015 HGSIL with LEEP with positive endocervical margin with negative follow up ECC. 2016 LGSIL with negative biopsy, subsequent paps normal. History of PCOS. Anemia managed by hematology, currently on iron daily. Was let go from job in January, so she has been stressed about it and experiencing some situational depression.   Gynecologic History No LMP recorded. (Menstrual status: Oral contraceptives).   Contraception/Family planning: OCP (estrogen/progesterone) Sexually active: Yes  Health Maintenance Last Pap: 01/29/2020. Results were: Normal Last mammogram: 05/17/2023. Results were: Possible right asymmetry, follow up imaging showed cluster of cysts Last colonoscopy: Not indicated Last Dexa: Not indicated Exercising: Yes. 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 Married. Currently unemployed. 43 yo daughter, graduated from [employer] 03/2023, at [employer]. 43 yo son, IT sales professional. [employer] with history of colon cancer.   ROS:  A ROS was performed and pertinent positives and negatives are included.  Exam:  Vitals:   06/07/23 0810  BP: 112/72  Pulse: 83  SpO2: 99%  Weight: 248 lb (112.5 kg)  Height: 5\' 2"  (1.575 m)     Body mass index is 45.36 kg/m.  General appearance:  Normal Thyroid:  Symmetrical, normal in size, without palpable masses or nodularity. Respiratory  Auscultation:  Clear without wheezing or rhonchi Cardiovascular  Auscultation:  Regular rate, without rubs, murmurs or gallops  Edema/varicosities:  Not grossly evident Abdominal  Soft,nontender, without masses, guarding or rebound.  Liver/spleen:  No organomegaly  noted  Hernia:  None appreciated  Skin  Inspection:  Grossly normal   Breasts: Examined lying and sitting.   Right: Without masses, retractions, discharge or axillary adenopathy.   Left: Without masses, retractions, discharge or axillary adenopathy. Pelvic: External genitalia:  no lesions              Urethra:  normal appearing urethra with no masses, tenderness or lesions              Bartholins and Skenes: normal                 Vagina: normal appearing vagina with normal color and discharge, no lesions              Cervix: no lesions Bimanual Exam:  Uterus:  no masses or tenderness              Adnexa: no mass, fullness, tenderness              Rectovaginal: Deferred              Anus:  normal, no lesions  Patient informed chaperone available to be present for breast and pelvic exam. Patient has requested no chaperone to be present. Patient has been advised what will be completed during breast and pelvic exam.   Assessment/Plan:  43 y.o. G2P2002 for annual exam.   Encounter for surveillance of contraceptive pills - Plan: Norethindrone Acetate-Ethinyl Estrad-FE (HAILEY 24 FE) 1-20 MG-MCG(24) tablet  PCOS (polycystic ovarian syndrome) - Plan: Norethindrone Acetate-Ethinyl Estrad-FE (HAILEY 24 FE) 1-20 MG-MCG(24) tablet  Situational depression    Well female exam with routine gynecological exam - Education provided on SBEs, importance of preventative screenings,  current guidelines, high calcium diet, regular exercise, and multivitamin daily. Labs with PCP.   Encounter for surveillance of contraceptive pills - Plan: Norethindrone Acetate-Ethinyl Estrad-FE (HAILEY 24 FE) 1-20 MG-MCG(24) tablet daily. Taking as prescribed.   PCOS (polycystic ovarian syndrome) - Plan: Norethindrone Acetate-Ethinyl Estrad-FE (HAILEY 24 FE) 1-20 MG-MCG(24) tablet daily. Good management. Takes continuously.   Situational depression - PHQ-12. Lost job in January unexpectedly. Denies SI.   Cervical  cancer screening - Plan: Cytology - PAP( [place]). 2015 HGSIL with LEEP with positive endocervical margin with negative follow up ECC. 2016 LGSIL with negative biopsy, subsequent paps normal. >3 normal paps since LEEP. Back to 3-year interval.   Screening for breast cancer - UTD. Normal breast exam today.   Return in about 1 year (around 06/06/2024) for Annual.      Olivia Mackie [facility], 8:53 AM 06/07/2023

## 2023-06-09 LAB — CYTOLOGY - PAP
Comment: NEGATIVE
Diagnosis: NEGATIVE
High risk HPV: NEGATIVE

## 2023-06-12 ENCOUNTER — Other Ambulatory Visit: Payer: Self-pay | Admitting: Nurse Practitioner

## 2023-06-12 ENCOUNTER — Encounter: Payer: Self-pay | Admitting: Nurse Practitioner

## 2023-06-12 DIAGNOSIS — K602 Anal fissure, unspecified: Secondary | ICD-10-CM

## 2023-06-12 MED ORDER — LIDOCAINE 5 % EX OINT: 1.0000 | TOPICAL_OINTMENT | CUTANEOUS | 0 refills | Status: AC | PRN

## 2023-06-27 ENCOUNTER — Ambulatory Visit (INDEPENDENT_AMBULATORY_CARE_PROVIDER_SITE_OTHER)

## 2023-06-27 ENCOUNTER — Ambulatory Visit: Admitting: Podiatry

## 2023-06-27 DIAGNOSIS — L03115 Cellulitis of right lower limb: Secondary | ICD-10-CM

## 2023-06-27 DIAGNOSIS — L03116 Cellulitis of left lower limb: Secondary | ICD-10-CM | POA: Diagnosis not present

## 2023-06-27 DIAGNOSIS — M722 Plantar fascial fibromatosis: Secondary | ICD-10-CM

## 2023-06-27 MED ORDER — DEXAMETHASONE SODIUM PHOSPHATE 120 MG/30ML IJ SOLN
4.0000 mg | Freq: Once | INTRAMUSCULAR | Status: AC
Start: 2023-06-27 — End: 2023-06-27
  Administered 2023-06-27: 4 mg via INTRA_ARTICULAR

## 2023-06-27 MED ORDER — TRIAMCINOLONE ACETONIDE 10 MG/ML IJ SUSP
2.5000 mg | Freq: Once | INTRAMUSCULAR | Status: AC
Start: 2023-06-27 — End: 2023-06-27
  Administered 2023-06-27: 2.5 mg via INTRA_ARTICULAR

## 2023-06-27 NOTE — Progress Notes (Signed)
  Subjective:  Patient ID: Gabriella Sawyer, female    DOB: 1980/09/24,   MRN: [id number]  No chief complaint on file.   43 y.o. female presents for concern of bilateral foot and ankle pain that has been ongoing since Augut about. She relates first steps in the morning have always been painful and now starting to get pain all throughout the day. She was seen by specialist and X-rays of ankle and back taken and some changes noted in L5. She has pain going up the back of her ankle as well on the left. Relates left is worse than right. Has had injections for PF in the past that did help.   . Denies any other pedal complaints. Denies n/v/f/c.   Past Medical History:  Diagnosis Date   Arthritis    Common migraine with intractable migraine 05/17/2017   LGSIL (low grade squamous intraepithelial dysplasia) 09/2013, 09/2014   Positive high risk HPV screen 2015, negative high risk HPV 2016   Panic attacks    PCOS (polycystic ovarian syndrome)    PONV (postoperative nausea and vomiting)     Objective:  Physical Exam: Vascular: DP/PT pulses 2/4 bilateral. CFT <3 seconds. Normal hair growth on digits. No edema.  Skin. No lacerations or abrasions bilateral feet.  Musculoskeletal: MMT 5/5 bilateral lower extremities in DF, PF, Inversion and Eversion. Deceased ROM in DF of ankle joint. Tender to the medial calcaneal tubercle bilaterally . No pain with achilles, PT or arch. No pain with calcaneal squeeze.  Neurological: Sensation intact to light touch.   Assessment:   1. Plantar fasciitis, bilateral      Plan:  Patient was evaluated and treated and all questions answered. Discussed plantar fasciitis with patient.  X-rays reviewed and discussed with patient. No acute fractures or dislocations noted. Mild spurring noted at inferior calcaneus.  Discussed treatment options including, ice, NSAIDS, supportive shoes, bracing, and stretching. Stretching exercises provided to be done on a daily basis.   Deferred  PF brace.  Patient requesting injection today. Procedure note below.   Follow-up 6 weeks or sooner if any problems arise. In the meantime, encouraged to call the office with any questions, concerns, change in symptoms.   Procedure:  Discussed etiology, pathology, conservative vs. surgical therapies. At this time a plantar fascial injection was recommended.  The patient agreed and a sterile skin prep was applied.  An injection consisting of  1cc dexamethasone 0.5 cc kenalog and 1cc marcaine mixture was infiltrated at the point of maximal tenderness on the bilateral Heel.  Bandaid applied. The patient tolerated this well and was given instructions for aftercare.    Louann Sjogren, DPM

## 2023-06-27 NOTE — Patient Instructions (Signed)

## 2023-06-28 ENCOUNTER — Other Ambulatory Visit: Payer: Self-pay | Admitting: Podiatry

## 2023-06-28 ENCOUNTER — Telehealth: Payer: Self-pay | Admitting: Podiatry

## 2023-06-28 MED ORDER — METHYLPREDNISOLONE 4 MG PO TBPK: ORAL_TABLET | ORAL | 0 refills | Status: DC

## 2023-06-28 NOTE — Telephone Encounter (Signed)
 Patient is requesting medication to relieve pain, back of left heel is in a lot of pain when walking. Patient would like to speak with Dr. Ralene Cork or nurse. Contact telephone number, [phone]

## 2023-07-14 ENCOUNTER — Ambulatory Visit: Payer: Self-pay

## 2023-07-14 NOTE — Telephone Encounter (Signed)
 Chief Complaint: Bilateral foot pain Symptoms: 9-10/10 pain both heels Frequency: Chronic issue, worse over past few days Pertinent Negatives: Patient denies other symptoms Disposition: [] ED /[x] Urgent Care (no appt availability in office) / [] Appointment(In office/virtual)/ []  [facility]/ [] Home Care/ [] Refused Recommended Disposition /[] [facility]/ []  Follow-up with PCP Additional Notes: Advised UC due to PCP not with [facility], advised to reach out to podiatry office and f/u with PCP regarding this after UC visit, she agrees.   Copied from CRM [id number]. Topic: Clinical - Red Word Triage >> Jul 14, 2023  9:36 AM [place] B wrote: [facility] that prompted transfer to Nurse Triage: pt has severe pain in her feet Reason for Disposition  [1] SEVERE pain (e.g., excruciating, unable to do any normal activities) AND [2] not improved after 2 hours of pain medicine  Answer Assessment - Initial Assessment Questions 1. ONSET: "When did the pain start?"      Gotten worse past few days 2. LOCATION: "Where is the pain located?"      Both feet, more on the left heel 3. PAIN: "How bad is the pain?"    (Scale 1-10; or mild, moderate, severe)  - MILD (1-3): doesn't interfere with normal activities.   - MODERATE (4-7): interferes with normal activities (e.g., work or school) or awakens from sleep, limping.   - SEVERE (8-10): excruciating pain, unable to do any normal activities, unable to walk.      9-10 4. WORK OR EXERCISE: "Has there been any recent work or exercise that involved this part of the body?"      No 5. CAUSE: "What do you think is causing the foot pain?"     Plantar fascitis  6. OTHER SYMPTOMS: "Do you have any other symptoms?" (e.g., leg pain, rash, fever, numbness)     Hip pain from the way having to walk due to the pain  Protocols used: Foot Pain-A-AH

## 2023-08-08 ENCOUNTER — Ambulatory Visit: Admitting: Podiatry

## 2023-08-31 ENCOUNTER — Encounter: Payer: Self-pay | Admitting: Nurse Practitioner

## 2023-09-01 NOTE — Telephone Encounter (Signed)
 Please schedule OV to discuss GLP-1

## 2023-09-05 NOTE — Telephone Encounter (Signed)
 Patient would like a call after 4pm to schedule her appt.

## 2023-09-12 NOTE — Telephone Encounter (Signed)
 Staff message sent to scheduling department to contact patient after 4pm for an OV to discuss medication.

## 2023-09-25 ENCOUNTER — Ambulatory Visit
Admission: EM | Admit: 2023-09-25 | Discharge: 2023-09-25 | Disposition: A | Discharge status: Home / Self Care | Attending: Family Medicine | Admitting: Family Medicine

## 2023-09-25 DIAGNOSIS — R070 Pain in throat: Secondary | ICD-10-CM | POA: Diagnosis present

## 2023-09-25 DIAGNOSIS — J069 Acute upper respiratory infection, unspecified: Secondary | ICD-10-CM | POA: Diagnosis not present

## 2023-09-25 LAB — POCT RAPID STREP A (OFFICE): Rapid Strep A Screen: NEGATIVE

## 2023-09-25 LAB — POC SARS CORONAVIRUS 2 AG -  ED: SARS Coronavirus 2 Ag: NEGATIVE

## 2023-09-25 MED ORDER — GUAIFENESIN-CODEINE 100-10 MG/5ML PO SOLN: 5.0000 mL | Freq: Four times a day (QID) | ORAL | 0 refills | Status: AC | PRN

## 2023-09-25 MED ORDER — KETOROLAC TROMETHAMINE 30 MG/ML IJ SOLN
30.0000 mg | Freq: Once | INTRAMUSCULAR | Status: AC
  Administered 2023-09-25: 30 mg via INTRAMUSCULAR

## 2023-09-25 MED ORDER — KETOROLAC TROMETHAMINE 10 MG PO TABS: 10.0000 mg | ORAL_TABLET | Freq: Four times a day (QID) | ORAL | 0 refills | Status: AC | PRN

## 2023-09-25 MED ORDER — PROMETHAZINE-DM 6.25-15 MG/5ML PO SYRP: 5.0000 mL | ORAL_SOLUTION | Freq: Four times a day (QID) | ORAL | 0 refills | Status: DC | PRN

## 2023-09-25 NOTE — Discharge Instructions (Addendum)
 Your strep test is negative.  Culture of the throat will be sent, and staff will notify you if that is in turn positive.  The COVID test was also negative.  This is most likely some other viral illness causing her symptoms.  You have been given a shot of Toradol  30 mg today.  Ketorolac  10 mg tablets--take 1 tablet every 6 hours as needed for pain.  This is the same medicine that is in the shot we just gave you  Do not take Celebrex or celecoxib while you are taking the ketorolac .   Robitussin with codeine cough syrup--take 5 mL or 1 teaspoon every 6 hours as needed for cough. *DO NOT TAKE THIS MEDICATION IF YOU HAVE TAKEN THE HYDROCODONE  5 MG YOU WERE PRESCRIBED ON 6/19*  Make sure you getting enough fluids then.

## 2023-09-25 NOTE — ED Provider Notes (Addendum)
 [name]    CSN: [id number] Arrival date & time: 09/25/23  1320      History   Chief Complaint No chief complaint on file.   HPI Gabriella Sawyer is a 43 y.o. female.   HPI Here for cough and nasal congestion and rhinorrhea and sore throat and malaise.  Her ears are also hurting.  Symptoms began yesterday.  This morning she noticed that she was hoarse.  No fever but she did have a sweat 1 time.  No nausea vomiting or diarrhea.  She feels short of breath when she walks her dog.   She does not tolerate codeine  Periods are irregular, last 1 was June 10.  She does take birth control.  Past Medical History:  Diagnosis Date   Arthritis    Common migraine with intractable migraine 05/17/2017   LGSIL (low grade squamous intraepithelial dysplasia) 09/2013, 09/2014   Positive high risk HPV screen 2015, negative high risk HPV 2016   Panic attacks    PCOS (polycystic ovarian syndrome)    PONV (postoperative nausea and vomiting)     Patient Active Problem List   Diagnosis Date Noted   Common migraine with intractable migraine 05/17/2017   Major depressive disorder, single episode, moderate (HCC) 07/21/2016   VAIN I (vaginal intraepithelial neoplasia grade I) 07/29/2011   PCOS (polycystic ovarian syndrome)     Past Surgical History:  Procedure Laterality Date   CERVICAL BIOPSY  W/ LOOP ELECTRODE EXCISION  11/01/2013   HGSIL on cervical biopsy, LGSIL on LEEP specimen with endocervical margin involvement   CESAREAN SECTION  12-03-2001  &  03-24-2003   LAST ONE W/ TUBAL LIGATION   FOOT SURGERY  2000   left foot, screw inserted   INTRAUTERINE DEVICE INSERTION  01/01/2015   Mirena    KNEE ARTHROSCOPY  12/16/2011   Procedure: ARTHROSCOPY KNEE;  Surgeon: Reyes JAYSON Billing, MD;  Location: [facility];  Service: Orthopedics;  Laterality: Right;  Right Knee Arthroscopy with Excision of Tibial Exostosis, debridement right knee medial menisectomy   KNEE ARTHROSCOPY  03/21/2012    Procedure: ARTHROSCOPY KNEE;  Surgeon: Lamar Collet, MD;  Location: [facility];  Service: Orthopedics;  Laterality: Right;  right chondroplasty   KNEE ARTHROSCOPY W/ MENISCECTOMY  07-08-2011  DR [facility]   RIGHT KNEE   KNEE SURGERY     LAPAROSCOPIC CHOLECYSTECTOMY  12-22-2008   MEDIAL COLLATERAL LIGAMENT REPAIR, KNEE  03/21/2012   Procedure: RECONSTRUCTION MEDIAL COLLATERAL LIGAMENT (MCL);  Surgeon: Lamar Collet, MD;  Location: [facility];  Service: Orthopedics;  Laterality: Right;  achilles tendon allograft   ROUX-EN-Y GASTRIC BYPASS  01-22-2008   LAPAROSCOPIC   TONSILLECTOMY AND ADENOIDECTOMY  1995   TUBAL LIGATION     WISDOM TOOTH EXTRACTION  2001    OB History     Gravida  2   Para  2   Term  2   Preterm      AB      Living  2      SAB      IAB      Ectopic      Multiple      Live Births               Home Medications    Prior to Admission medications   Medication Sig Start Date End Date Taking? Authorizing Provider  guaiFENesin-codeine 100-10 MG/5ML syrup Take 5 mLs by mouth every 6 (six) hours as needed for cough. 09/25/23  Yes Vonna Males  K, MD  ketorolac  (TORADOL ) 10 MG tablet Take 1 tablet (10 mg total) by mouth every 6 (six) hours as needed (pain). 09/25/23  Yes Vonna Sharlet POUR, MD  acetaminophen  (TYLENOL ) 500 MG tablet Take 500 mg by mouth every 6 (six) hours as needed for moderate pain.    [provider]  albuterol  (VENTOLIN  HFA) 108 (90 Base) MCG/ACT inhaler Inhale 2 puffs into the lungs every 4 (four) hours as needed for wheezing or shortness of breath.    [provider]  allopurinol (ZYLOPRIM) 100 MG tablet Take 100 mg by mouth daily. 05/28/23   [provider]  ALPRAZolam  (XANAX ) 1 MG tablet Take 1 tablet (1 mg total) by mouth at bedtime as needed for anxiety. 01/29/20   Prentiss Riggs A, NP  celecoxib (CELEBREX) 200 MG capsule Take 200 mg by mouth daily.    [provider]  cetirizine (ZYRTEC) 10 MG tablet  Take 1 tablet by mouth daily. 07/13/22   [provider]  colchicine 0.6 MG tablet Take by mouth. 01/01/23   [provider]  cyanocobalamin (VITAMIN B12) 1000 MCG/ML injection Inject 1,000 mcg into the muscle every 30 (thirty) days. 12/26/21   [provider]  EPINEPHrine  (EPI-PEN) 0.3 mg/0.3 mL DEVI Inject 0.3 mg into the muscle once as needed. reaction    [provider]  lidocaine  (XYLOCAINE ) 5 % ointment Apply 1 Application topically as needed. 06/12/23   Prentiss Riggs LABOR, NP  Norethindrone  Acetate-Ethinyl Estrad-FE (HAILEY  24 FE) 1-20 MG-MCG(24) tablet Take 1 tablet by mouth daily. Take ACTIVE pills only, skipping PLACEBO pills 06/07/23   Prentiss Riggs A, NP  prazosin (MINIPRESS) 1 MG capsule Take 1 mg by mouth at bedtime.    [provider]  Vitamin D , Ergocalciferol , (DRISDOL ) 1.25 MG (50000 UNIT) CAPS capsule Take 50,000 Units by mouth every 7 (seven) days.    [provider]    Family History Family History  Problem Relation Age of Onset   Diabetes Father    Hypertension Father    Cancer Maternal Grandmother        COLON CANCER   Diabetes Paternal Grandfather    Breast cancer Neg Hx     Social History Social History   Tobacco Use   Smoking status: Never   Smokeless tobacco: Never  Vaping Use   Vaping status: Never Used  Substance Use Topics   Alcohol use: No    Alcohol/week: 0.0 standard drinks of alcohol   Drug use: No     Allergies   Codeine and Shrimp [shellfish allergy]   Review of Systems Review of Systems   Physical Exam Triage Vital Signs ED Triage Vitals  Encounter Vitals Group     BP 09/25/23 1356 110/74     Girls Systolic BP Percentile --      Girls Diastolic BP Percentile --      Boys Systolic BP Percentile --      Boys Diastolic BP Percentile --      Pulse Rate 09/25/23 1356 86     Resp 09/25/23 1356 18     Temp 09/25/23 1356 98.6 F (37 C)     Temp Source 09/25/23 1356 Oral     SpO2  09/25/23 1356 94 %     Weight --      Height --      Head Circumference --      Peak Flow --      Pain Score 09/25/23 1357 9  Pain Loc --      Pain Education --      Exclude from Growth Chart --    No data found.  Updated Vital Signs BP 110/74 (BP Location: Right Arm)   Pulse 86   Temp 98.6 F (37 C) (Oral)   Resp 18   LMP 09/12/2023 (Approximate)   SpO2 94%   Visual Acuity Right Eye Distance:   Left Eye Distance:   Bilateral Distance:    Right Eye Near:   Left Eye Near:    Bilateral Near:     Physical Exam Vitals reviewed.  Constitutional:      General: She is not in acute distress.    Appearance: She is not ill-appearing, toxic-appearing or diaphoretic.  HENT:     Right Ear: Tympanic membrane and ear canal normal.     Left Ear: Tympanic membrane and ear canal normal.     Nose: Congestion present.     Mouth/Throat:     Mouth: Mucous membranes are moist.     Comments: There is clear mucus draining and there is some mild erythema of the posterior oropharynx where there is some lymphoid hypertrophy.  The tonsillar pillars are normal and there is no asymmetry.  Uvula is in the midline.    Eyes:     Extraocular Movements: Extraocular movements intact.     Conjunctiva/sclera: Conjunctivae normal.     Pupils: Pupils are equal, round, and reactive to light.    Cardiovascular:     Rate and Rhythm: Normal rate and regular rhythm.     Heart sounds: No murmur heard. Pulmonary:     Effort: Pulmonary effort is normal. No respiratory distress.     Breath sounds: No stridor. No wheezing, rhonchi or rales.   Musculoskeletal:     Cervical back: Neck supple.  Lymphadenopathy:     Cervical: No cervical adenopathy.   Skin:    Capillary Refill: Capillary refill takes less than 2 seconds.     Coloration: Skin is not jaundiced or pale.   Neurological:     General: No focal deficit present.     Mental Status: She is alert and oriented to person, place, and time.    Psychiatric:        Behavior: Behavior normal.      UC Treatments / Results  Labs (all labs ordered are listed, but only abnormal results are displayed) Labs Reviewed  POCT RAPID STREP A (OFFICE) - Normal  CULTURE, GROUP A STREP ([facility])  POC SARS CORONAVIRUS 2 AG -  ED    EKG   Radiology No results found.  Procedures Procedures (including critical care time)  Medications Ordered in UC Medications  ketorolac  (TORADOL ) 30 MG/ML injection 30 mg (30 mg Intramuscular Given 09/25/23 1453)    Initial Impression / Assessment and Plan / UC Course  I have reviewed the triage vital signs and the nursing notes.  Pertinent labs & imaging results that were available during my care of the patient were reviewed by me and considered in my medical decision making (see chart for details).     Strep is negative.  COVID test is negative.  Throat culture is sent and our staff will notify her and treat per protocol if that is in turn positive for strep.  Phenergan  and dextromethorphan syrup is sent in for her cough and Toradol  injection was given here and Toradol  tablets are sent to the pharmacy for pain.  Work note is provided  At discharge patient is  unhappy that she has not been prescribed Robitussin with codeine.  She and I had discussed that this causes itching though that is not a true allergy.  Staff discussed with her that the Phenergan  with dextromethorphan would cause drowsiness and help with the cough.  She was still persisting in her request to have Robitussin with codeine, so that she could have the guaifenesin part also.  I relented and on PMP she received a prescription for hydrocodone  5 mg on June 19 for a 1 week supply.  This came from [facility] pain management.  She has been prescribed Robitussin with codeine, so that she can have what she requests and relieve her cough.  We have put on the discharge instructions that she is not to take the Robitussin with codeine  along with the hydrocodone . Final Clinical Impressions(s) / UC Diagnoses   Final diagnoses:  Viral URI  Throat pain     Discharge Instructions      Your strep test is negative.  Culture of the throat will be sent, and staff will notify you if that is in turn positive.  The COVID test was also negative.  This is most likely some other viral illness causing her symptoms.  You have been given a shot of Toradol  30 mg today.  Ketorolac  10 mg tablets--take 1 tablet every 6 hours as needed for pain.  This is the same medicine that is in the shot we just gave you  Do not take Celebrex or celecoxib while you are taking the ketorolac .   Robitussin with codeine cough syrup--take 5 mL or 1 teaspoon every 6 hours as needed for cough. *DO NOT TAKE THIS MEDICATION IF YOU HAVE TAKEN THE HYDROCODONE  5 MG YOU WERE PRESCRIBED ON 6/19*  Make sure you getting enough fluids then.     ED Prescriptions     Medication Sig Dispense Auth. Provider   promethazine -dextromethorphan (PROMETHAZINE -DM) 6.25-15 MG/5ML syrup  (Status: Discontinued) Take 5 mLs by mouth 4 (four) times daily as needed for cough. 118 mL Vonna Sharlet POUR, MD   ketorolac  (TORADOL ) 10 MG tablet Take 1 tablet (10 mg total) by mouth every 6 (six) hours as needed (pain). 20 tablet Doshia Dalia K, MD   guaiFENesin-codeine 100-10 MG/5ML syrup Take 5 mLs by mouth every 6 (six) hours as needed for cough. 120 mL Vonna Sharlet POUR, MD      I have reviewed the PDMP during this encounter.   Vonna Sharlet POUR, MD 09/25/23 1446    Vonna Sharlet POUR, MD 09/25/23 1447    Vonna Sharlet POUR, MD 09/25/23 [phone]

## 2023-09-25 NOTE — ED Triage Notes (Signed)
 Pt report she has throat pain, cough, sweats, bilateral ear pain, and fatigue x 1 day

## 2023-09-27 ENCOUNTER — Ambulatory Visit (HOSPITAL_COMMUNITY): Payer: Self-pay

## 2023-09-27 LAB — CULTURE, GROUP A STREP (THRC)

## 2023-10-04 ENCOUNTER — Ambulatory Visit: Admitting: Nurse Practitioner

## 2023-12-25 ENCOUNTER — Encounter: Payer: Self-pay | Admitting: Podiatry

## 2023-12-25 ENCOUNTER — Ambulatory Visit: Admitting: Podiatry

## 2023-12-25 DIAGNOSIS — M5416 Radiculopathy, lumbar region: Secondary | ICD-10-CM

## 2023-12-25 DIAGNOSIS — M722 Plantar fascial fibromatosis: Secondary | ICD-10-CM

## 2023-12-25 MED ORDER — TRIAMCINOLONE ACETONIDE 10 MG/ML IJ SUSP: 2.5000 mg | Freq: Once | INTRAMUSCULAR | Status: AC

## 2023-12-25 MED ORDER — DEXAMETHASONE SODIUM PHOSPHATE 120 MG/30ML IJ SOLN
4.0000 mg | Freq: Once | INTRAMUSCULAR | Status: AC
Start: 2023-12-25 — End: ?

## 2023-12-25 NOTE — Patient Instructions (Signed)

## 2023-12-25 NOTE — Progress Notes (Signed)
  Subjective:  Patient ID: Gabriella Sawyer, female    DOB: 1980-07-02,   MRN: [id number]  Chief Complaint  Patient presents with   Foot Pain    I want to cry, my feet and ankles hurt so bad.  I can barely walk.  At night, it feels like my feet curl up and cramp.    43 y.o. female presents for return of bilateral foot pain. Relates pain is very severe and feet cramp up. Pains go up her legs. Has had injections for PF in the past that did help.   History of L5 radiculopathy and has had saphenous nerve decompression after failed knee surgery.  Denies any other pedal complaints. Denies n/v/f/c.   Past Medical History:  Diagnosis Date   Arthritis    Common migraine with intractable migraine 05/17/2017   LGSIL (low grade squamous intraepithelial dysplasia) 09/2013, 09/2014   Positive high risk HPV screen 2015, negative high risk HPV 2016   Panic attacks    PCOS (polycystic ovarian syndrome)    PONV (postoperative nausea and vomiting)     Objective:  Physical Exam: Vascular: DP/PT pulses 2/4 bilateral. CFT <3 seconds. Normal hair growth on digits. No edema.  Skin. No lacerations or abrasions bilateral feet.  Musculoskeletal: MMT 5/5 bilateral lower extremities in DF, PF, Inversion and Eversion. Deceased ROM in DF of ankle joint. Tender to the medial calcaneal tubercle bilaterally . No pain with achilles, PT or arch. No pain with calcaneal squeeze.  Neurological: Sensation intact to light touch.   Assessment:   1. Plantar fasciitis, bilateral   2. Lumbar radiculopathy       Plan:  Patient was evaluated and treated and all questions answered. Discussed plantar fasciitis with patient.  X-rays reviewed and discussed with patient. No acute fractures or dislocations noted. Mild spurring noted at inferior calcaneus.  Discussed treatment options including, ice, NSAIDS, supportive shoes, bracing, and stretching. Stretching exercises provided to be done on a daily basis.   Deferred PF brace.   Patient requesting injection today. Procedure note below.   Discussed given pain down legs and history of saphenous issues and lower back pains feel she needs to have these evaluated. Relates she has and has been moved from doctor to doctor. She does not believe she has seen pain management.  Follow-up 6 weeks or sooner if any problems arise. In the meantime, encouraged to call the office with any questions, concerns, change in symptoms.   Procedure:  Discussed etiology, pathology, conservative vs. surgical therapies. At this time a plantar fascial injection was recommended.  The patient agreed and a sterile skin prep was applied.  An injection consisting of  1cc dexamethasone  0.5 cc kenalog  and 1cc marcaine  mixture was infiltrated at the point of maximal tenderness on the bilateral Heel.  Bandaid applied. The patient tolerated this well and was given instructions for aftercare.    Asberry Failing, DPM

## 2024-01-09 ENCOUNTER — Telehealth: Payer: Self-pay | Admitting: Lab

## 2024-01-09 NOTE — Telephone Encounter (Signed)
 Patient is calling regarding nerve test and would like to know when will that be done hasn't heard from anyone please review and advise.

## 2024-02-05 ENCOUNTER — Ambulatory Visit: Admitting: Podiatry

## 2024-04-01 ENCOUNTER — Other Ambulatory Visit: Payer: Self-pay | Admitting: Nurse Practitioner

## 2024-04-01 DIAGNOSIS — Z1231 Encounter for screening mammogram for malignant neoplasm of breast: Secondary | ICD-10-CM

## 2024-04-06 IMAGING — MG MM DIGITAL SCREENING BILAT W/ TOMO AND CAD
8 series · 8 of 24 positions shown · non-contrast
Comparison: None available.

CLINICAL DATA: Screening.

EXAM:
DIGITAL SCREENING BILATERAL MAMMOGRAM WITH TOMOSYNTHESIS AND CAD
TECHNIQUE: Bilateral screening digital craniocaudal and mediolateral oblique
mammograms were obtained. Bilateral screening digital breast
tomosynthesis was performed. The images were evaluated with
computer-aided detection.

[R MLO synth-2D]
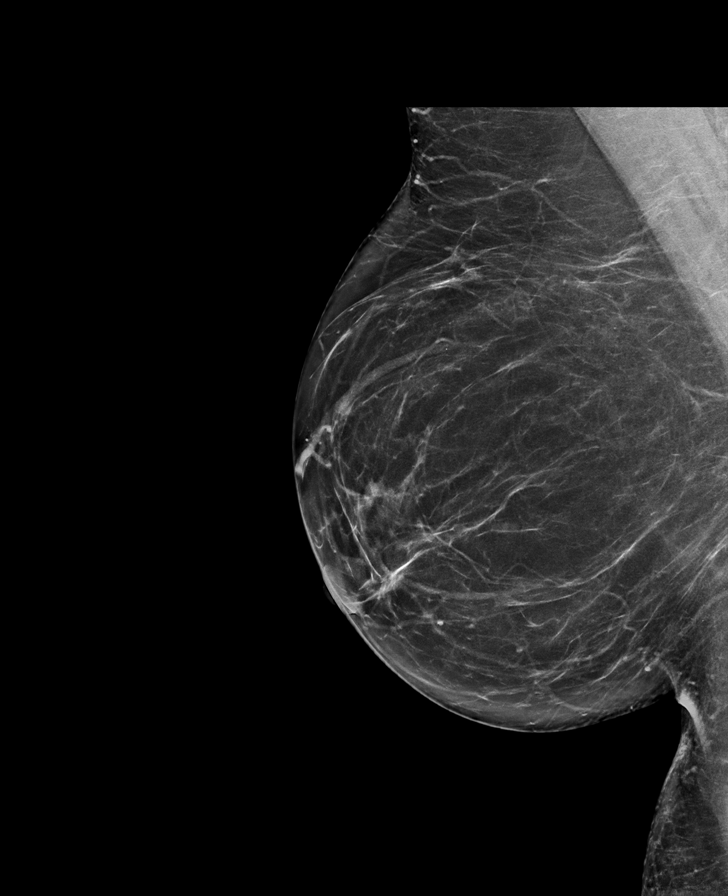

[L CC synth-2D]
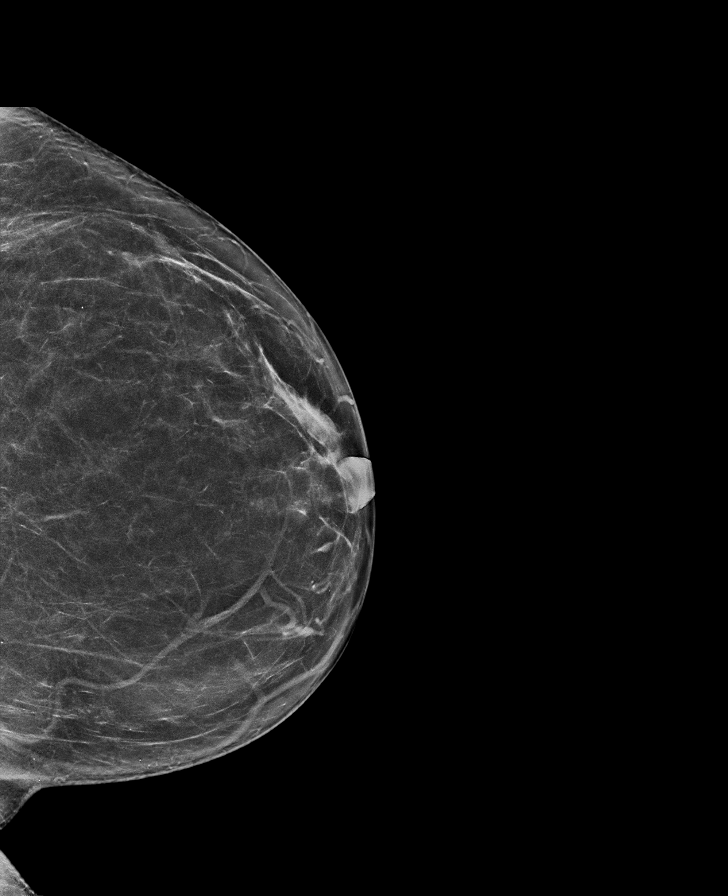

[L MLO synth-2D]
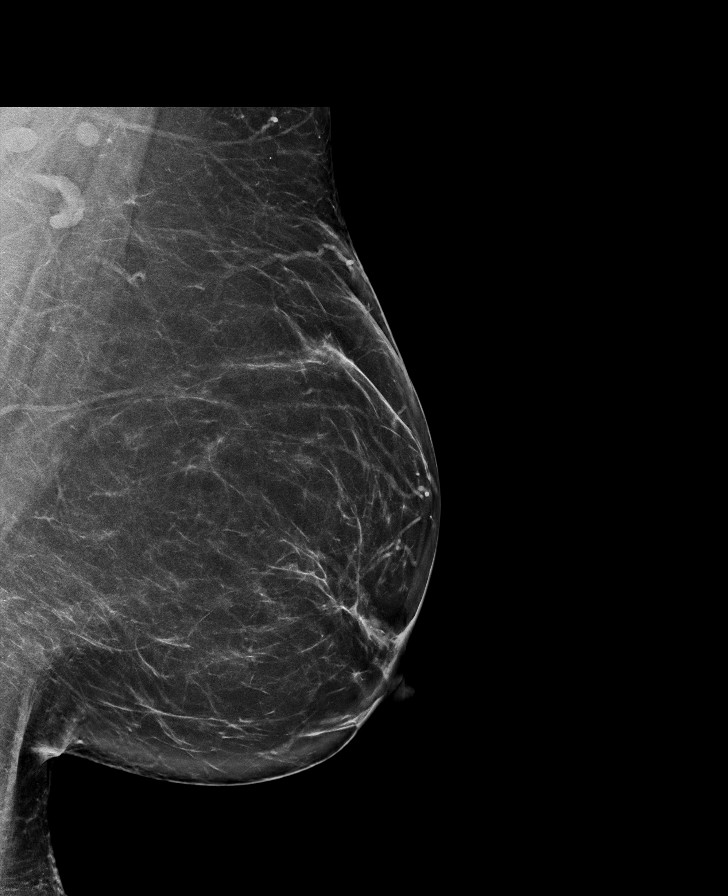

[R CC synth-2D]
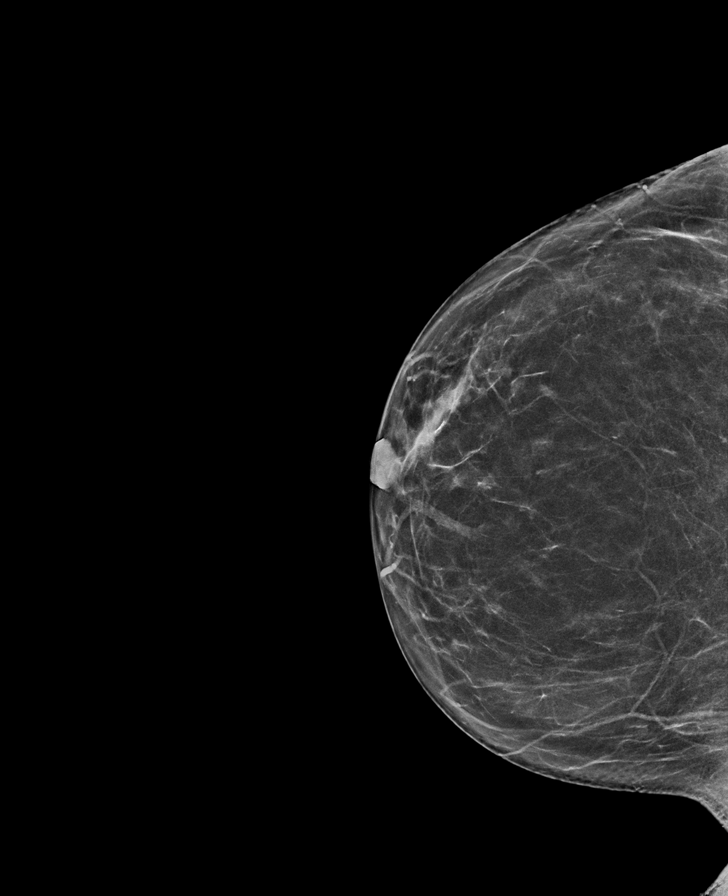

[L CC tomo · tomo slice 35/70.0]
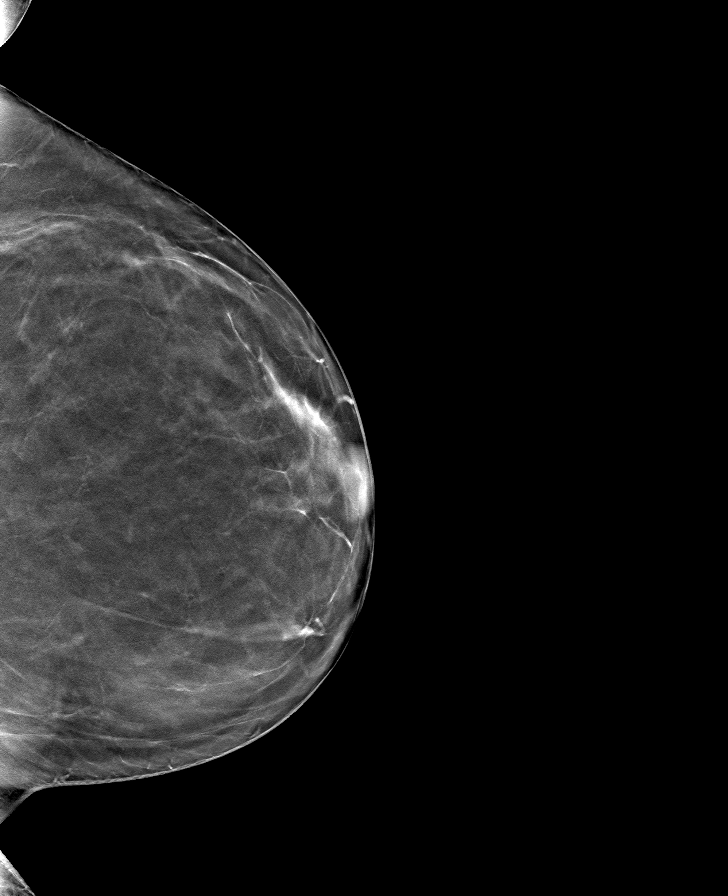

[R MLO tomo · tomo slice 37/74.0]
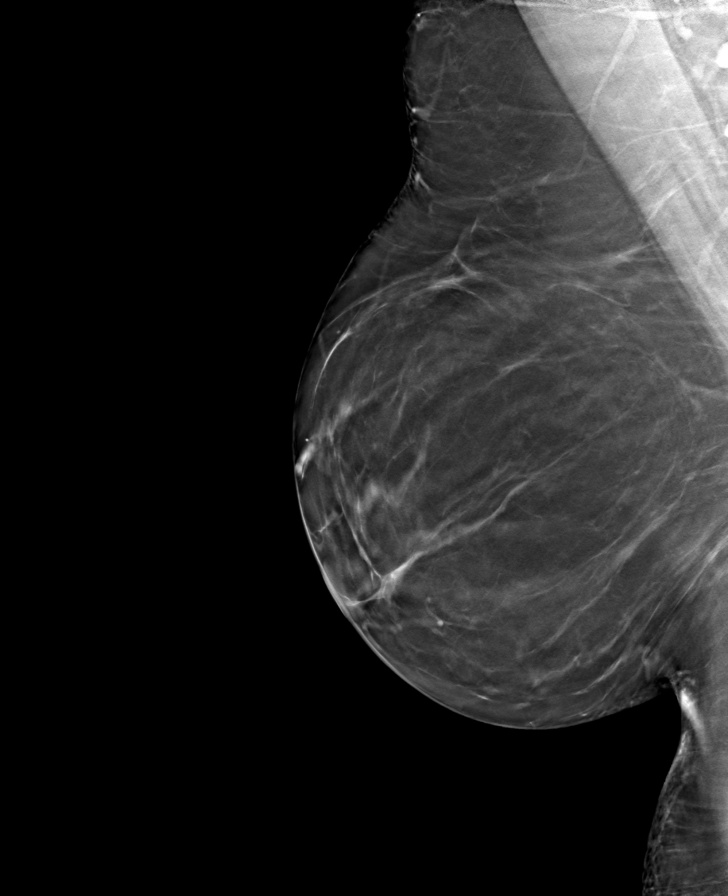

[R CC tomo · tomo slice 32/63.0]
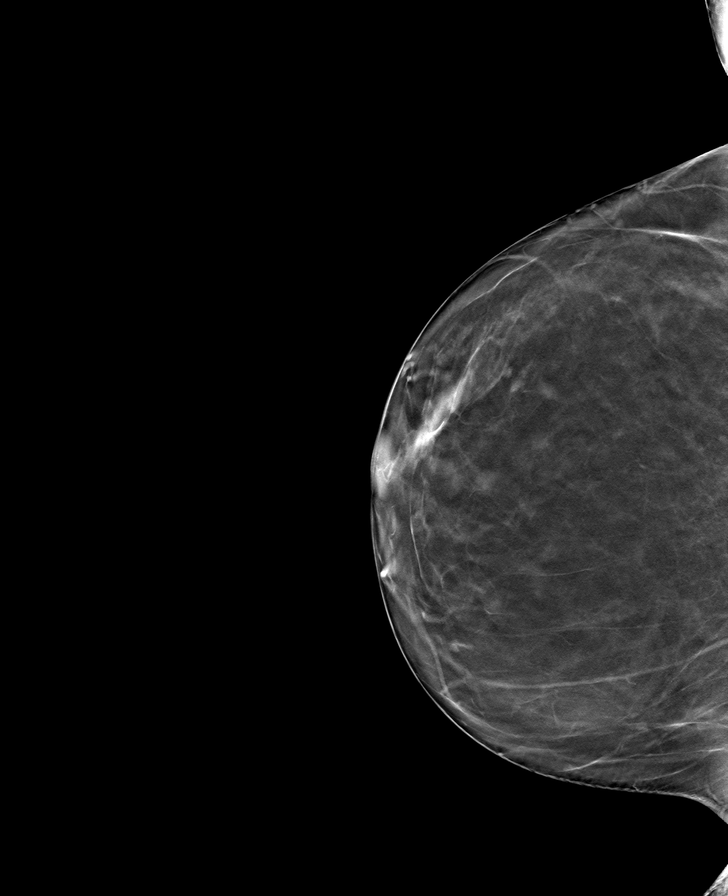

[L MLO tomo · tomo slice 39/78.0]
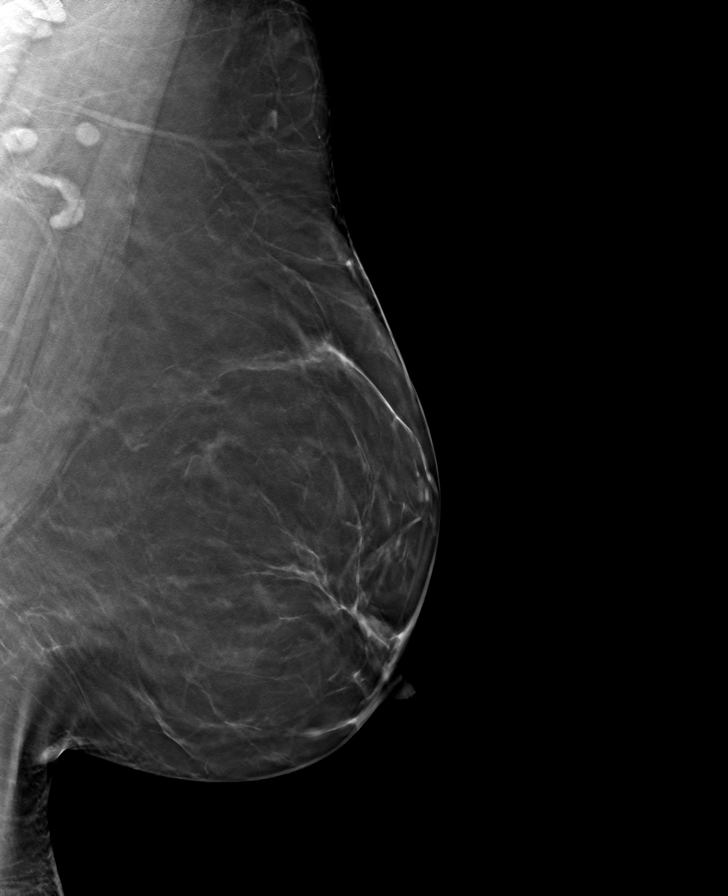

[8 of 24 positions shown; findings below may reference images not displayed]

ACR Breast Density Category b: There are scattered areas of
fibroglandular density.
FINDINGS: There are no findings suspicious for malignancy.
IMPRESSION: No mammographic evidence of malignancy. A result letter of this
screening mammogram will be mailed directly to the patient.

RECOMMENDATION:
Screening mammogram in one year. (Code:GB-Z-FIU)

BI-RADS CATEGORY  1: Negative.

## 2024-05-22 ENCOUNTER — Ambulatory Visit

## 2024-06-07 ENCOUNTER — Ambulatory Visit: Admitting: Nurse Practitioner

## 2024-06-10 ENCOUNTER — Ambulatory Visit: Admitting: Nurse Practitioner
